# Patient Record
Sex: Male | Born: 1965 | Race: White | Hispanic: No | Marital: Married | Smoking: Never smoker
Health system: Southern US, Community
[De-identification: ages and names within clinical notes are randomized; demographics above are authoritative.]

## PROBLEM LIST (undated history)

## (undated) DIAGNOSIS — I447 Left bundle-branch block, unspecified: Secondary | ICD-10-CM

## (undated) DIAGNOSIS — J45909 Unspecified asthma, uncomplicated: Secondary | ICD-10-CM

## (undated) DIAGNOSIS — K219 Gastro-esophageal reflux disease without esophagitis: Secondary | ICD-10-CM

## (undated) DIAGNOSIS — N529 Male erectile dysfunction, unspecified: Secondary | ICD-10-CM

## (undated) DIAGNOSIS — Z973 Presence of spectacles and contact lenses: Secondary | ICD-10-CM

## (undated) DIAGNOSIS — I10 Essential (primary) hypertension: Secondary | ICD-10-CM

## (undated) DIAGNOSIS — I429 Cardiomyopathy, unspecified: Secondary | ICD-10-CM

## (undated) DIAGNOSIS — M109 Gout, unspecified: Secondary | ICD-10-CM

## (undated) DIAGNOSIS — I509 Heart failure, unspecified: Secondary | ICD-10-CM

## (undated) DIAGNOSIS — E785 Hyperlipidemia, unspecified: Secondary | ICD-10-CM

## (undated) DIAGNOSIS — G4700 Insomnia, unspecified: Secondary | ICD-10-CM

## (undated) HISTORY — DX: Essential (primary) hypertension: I10

## (undated) HISTORY — DX: Morbid (severe) obesity due to excess calories: E66.01

## (undated) HISTORY — DX: Unspecified asthma, uncomplicated: J45.909

## (undated) HISTORY — DX: Male erectile dysfunction, unspecified: N52.9

## (undated) HISTORY — DX: Hyperlipidemia, unspecified: E78.5

## (undated) HISTORY — PX: HERNIA REPAIR: SHX51

## (undated) HISTORY — DX: Insomnia, unspecified: G47.00

## (undated) HISTORY — DX: Cardiomyopathy, unspecified: I42.9

## (undated) HISTORY — DX: Gout, unspecified: M10.9

---

## 2001-06-29 ENCOUNTER — Encounter: Payer: Self-pay | Admitting: Family Medicine

## 2001-06-29 ENCOUNTER — Encounter: Admission: RE | Admit: 2001-06-29 | Discharge: 2001-06-29 | Payer: Self-pay | Admitting: Family Medicine

## 2002-10-26 ENCOUNTER — Encounter: Admission: RE | Admit: 2002-10-26 | Discharge: 2002-10-26 | Payer: Self-pay | Admitting: Family Medicine

## 2002-10-26 ENCOUNTER — Encounter: Payer: Self-pay | Admitting: Family Medicine

## 2003-10-07 ENCOUNTER — Encounter: Admission: RE | Admit: 2003-10-07 | Discharge: 2003-10-07 | Payer: Self-pay | Admitting: Family Medicine

## 2003-10-07 ENCOUNTER — Encounter: Payer: Self-pay | Admitting: Family Medicine

## 2007-12-04 ENCOUNTER — Ambulatory Visit (HOSPITAL_COMMUNITY): Admission: RE | Admit: 2007-12-04 | Discharge: 2007-12-04 | Payer: Self-pay | Admitting: Surgery

## 2011-04-27 NOTE — Op Note (Signed)
NAME:  Travis Cline, Travis Cline              ACCOUNT NO.:  [id number]   MEDICAL RECORD NO.:  [id number]          PATIENT TYPE:  AMB   LOCATION:  DAY                          FACILITY:  [facility]   PHYSICIAN:  Wilmon Arms. Corliss Skains, M.D. DATE OF BIRTH:  28-Sep-1966   DATE OF PROCEDURE:  12/04/2007  DATE OF DISCHARGE:                               OPERATIVE REPORT   PREOPERATIVE DIAGNOSIS:  Umbilical hernia.   POSTOPERATIVE DIAGNOSIS:  Umbilical hernia.   PROCEDURE PERFORMED:  Umbilical hernia repair with mesh.   SURGEON:  Wilmon Arms. Tsuei, M.D., FACS   ANESTHESIA:  General.   INDICATIONS:  The patient is a 45 year old male who presents with a 7-  month history of a slowly enlarging bulge in his umbilicus which  occasionally causes him discomfort.  He denies obstructive symptoms.  He  presents today for elective repair of his umbilical hernia.   DESCRIPTION OF PROCEDURE:  The patient was brought to the operating room  and placed in the supine position on the operating room table.  After an  adequate level general anesthesia was obtained, the patient's  periumbilical region was shaved, prepped with Betadine, and draped in  sterile fashion.  The area around his umbilicus was infiltrated with  0.50% Marcaine.  A transverse incision was made above his umbilicus.  Dissection was carried down to the subcutaneous tissues with cautery.  We were able to bluntly dissect around the hernia sac.  Careful  examination showed that the patient actually has 3 hernia sacs densely  adherent to the undersurface of the umbilicus.  These were all dissected  free.  The 3 defects were all immediately adjacent to each other.  We  connected all of these defects together to create one defect measuring  about a centimeter and a half across.  All of the hernia sacs were then  reduced into the preperitoneal space.  I undermined the preperitoneum in  all directions.  A small Ventralex mesh was then inserted into the  preperitoneal space.  It was secured with 4 transfascia 0 Prolene  sutures.  These were then tied down to suspend the mesh from the  posterior surface of the fascia.  The tape of the Ventralex mesh was  then amputated.  The fascia was closed with figure-of-eight 0 Prolene  sutures.  Three-0 Vicryl was used to  tack down the base of the umbilicus to the fascia.  Three-0 Vicryl was  then used to close the subcutaneous tissues.  Four-0 Monocryl was used  to close the skin.  Steri-Strips and clean dressings were applied.  The  patient was then extubated and brought to recovery room in stable  condition.  All sponge, instrument, and needle counts were correct.      Wilmon Arms. Tsuei, M.D.  Electronically Signed     MKT/MEDQ  D:  12/04/2007  T:  12/05/2007  Job:  [id number]   cc:   Donia Guiles, M.D.  Fax: [phone]

## 2011-09-17 LAB — CBC
HCT: 46.2
Hemoglobin: 16.2
MCHC: 35
MCV: 89
Platelets: 176
RBC: 5.19
RDW: 12.9
WBC: 6

## 2011-09-17 LAB — BASIC METABOLIC PANEL
BUN: 10
CO2: 23
Calcium: 9.5
Chloride: 108
Creatinine, Ser: 0.77
GFR calc Af Amer: >60
GFR calc non Af Amer: >60
Glucose, Bld: 165 — ABNORMAL HIGH
Potassium: 3.9
Sodium: 140

## 2011-09-17 LAB — DIFFERENTIAL
Basophils Absolute: 0.1
Basophils Relative: 2 — ABNORMAL HIGH
Eosinophils Absolute: 0.1 — ABNORMAL LOW
Eosinophils Relative: 2
Lymphocytes Relative: 28
Lymphs Abs: 1.6
Monocytes Absolute: 0.5
Monocytes Relative: 8
Neutro Abs: 3.7
Neutrophils Relative %: 62

## 2012-09-15 ENCOUNTER — Other Ambulatory Visit: Payer: Self-pay | Admitting: Family Medicine

## 2012-09-15 DIAGNOSIS — R7989 Other specified abnormal findings of blood chemistry: Secondary | ICD-10-CM

## 2012-09-22 ENCOUNTER — Other Ambulatory Visit: Payer: Self-pay

## 2012-09-29 ENCOUNTER — Ambulatory Visit
Admission: RE | Admit: 2012-09-29 | Discharge: 2012-09-29 | Disposition: A | Discharge status: Home / Self Care | Payer: 59 | Source: Ambulatory Visit | Attending: Family Medicine | Admitting: Family Medicine

## 2012-09-29 DIAGNOSIS — R7989 Other specified abnormal findings of blood chemistry: Secondary | ICD-10-CM

## 2015-04-16 ENCOUNTER — Other Ambulatory Visit (HOSPITAL_COMMUNITY): Payer: Self-pay | Admitting: Respiratory Therapy

## 2015-04-16 DIAGNOSIS — R0602 Shortness of breath: Secondary | ICD-10-CM

## 2015-04-22 ENCOUNTER — Encounter (HOSPITAL_COMMUNITY): Payer: Self-pay

## 2015-06-17 ENCOUNTER — Ambulatory Visit (INDEPENDENT_AMBULATORY_CARE_PROVIDER_SITE_OTHER): Payer: BC Managed Care – PPO | Admitting: Internal Medicine

## 2015-06-17 DIAGNOSIS — R0602 Shortness of breath: Secondary | ICD-10-CM

## 2015-06-17 LAB — PULMONARY FUNCTION TEST
DL/VA % pred: 115 %
DL/VA: 5.4 ml/min/mmHg/L
DLCO UNC % PRED: 99 %
DLCO UNC: 32.49 ml/min/mmHg
FEF 25-75 Post: 2.93 L/sec
FEF 25-75 Pre: 2.09 L/sec
FEF2575-%Change-Post: 40 %
FEF2575-%Pred-Post: 82 %
FEF2575-%Pred-Pre: 58 %
FEV1-%Change-Post: 8 %
FEV1-%PRED-POST: 78 %
FEV1-%Pred-Pre: 72 %
FEV1-POST: 3.14 L
FEV1-Pre: 2.88 L
FEV1FVC-%CHANGE-POST: 2 %
FEV1FVC-%Pred-Pre: 94 %
FEV6-%Change-Post: 5 %
FEV6-%PRED-PRE: 78 %
FEV6-%Pred-Post: 82 %
FEV6-POST: 4.09 L
FEV6-PRE: 3.88 L
FEV6FVC-%CHANGE-POST: 0 %
FEV6FVC-%PRED-POST: 102 %
FEV6FVC-%Pred-Pre: 102 %
FVC-%CHANGE-POST: 5 %
FVC-%PRED-POST: 80 %
FVC-%PRED-PRE: 75 %
FVC-PRE: 3.9 L
FVC-Post: 4.13 L
POST FEV6/FVC RATIO: 99 %
PRE FEV1/FVC RATIO: 74 %
PRE FEV6/FVC RATIO: 99 %
Post FEV1/FVC ratio: 76 %
RV % PRED: 97 %
RV: 1.97 L
TLC % PRED: 86 %
TLC: 6.07 L

## 2015-06-17 NOTE — Progress Notes (Signed)
PFT done today. 

## 2015-07-15 ENCOUNTER — Ambulatory Visit (INDEPENDENT_AMBULATORY_CARE_PROVIDER_SITE_OTHER): Payer: BC Managed Care – PPO | Admitting: Pulmonary Disease

## 2015-07-15 ENCOUNTER — Encounter (INDEPENDENT_AMBULATORY_CARE_PROVIDER_SITE_OTHER): Payer: Self-pay

## 2015-07-15 ENCOUNTER — Ambulatory Visit (INDEPENDENT_AMBULATORY_CARE_PROVIDER_SITE_OTHER)
Admission: RE | Admit: 2015-07-15 | Discharge: 2015-07-15 | Disposition: A | Discharge status: Home / Self Care | Payer: BC Managed Care – PPO | Source: Ambulatory Visit | Attending: Pulmonary Disease | Admitting: Pulmonary Disease

## 2015-07-15 VITALS — BP 122/84 | HR 98 | Temp 97.2°F | Ht 70.0 in | Wt 272.2 lb

## 2015-07-15 DIAGNOSIS — R0600 Dyspnea, unspecified: Secondary | ICD-10-CM | POA: Diagnosis not present

## 2015-07-15 DIAGNOSIS — J683 Other acute and subacute respiratory conditions due to chemicals, gases, fumes and vapors: Secondary | ICD-10-CM

## 2015-07-15 DIAGNOSIS — J4520 Mild intermittent asthma, uncomplicated: Secondary | ICD-10-CM

## 2015-07-15 MED ORDER — CLONAZEPAM 0.5 MG PO TABS: 0.5000 mg | ORAL_TABLET | Freq: Two times a day (BID) | ORAL | Status: DC

## 2015-07-15 MED ORDER — FLUTICASONE-SALMETEROL 250-50 MCG/DOSE IN AEPB: 1.0000 | INHALATION_SPRAY | Freq: Two times a day (BID) | RESPIRATORY_TRACT | Status: DC

## 2015-07-15 NOTE — Patient Instructions (Signed)
Travis Oka- it was great meeting you today...  Today we reviewed your history & the previous pulmonary function test,     and did a CXR & oxygen saturation test w/ exercise... We will also schedulae a 2DEchocardiogram to check your heart pump function in light of your HBP & BP meds...    We will contact you w/ the results when available...   In the meanwhile we will start treatment for the chest wall component w/ a combination relaxer as we discussed>    Start the [facility] 0.5mg  one tab twice daily...  For the reactive airways component- I rec starting [id number]- one inhalation twice daily...  Call for any questions...  Let's plan a follow up visit in 4-6weeks, sooner if needed for any breathing problems.Marland KitchenMarland Kitchen

## 2015-07-16 ENCOUNTER — Encounter: Payer: Self-pay | Admitting: Nurse Practitioner

## 2015-07-16 ENCOUNTER — Encounter: Payer: Self-pay | Admitting: Pulmonary Disease

## 2015-07-16 ENCOUNTER — Ambulatory Visit (HOSPITAL_COMMUNITY): Payer: BC Managed Care – PPO | Attending: Cardiology

## 2015-07-16 ENCOUNTER — Other Ambulatory Visit: Payer: Self-pay

## 2015-07-16 ENCOUNTER — Ambulatory Visit (INDEPENDENT_AMBULATORY_CARE_PROVIDER_SITE_OTHER): Payer: BC Managed Care – PPO | Admitting: Nurse Practitioner

## 2015-07-16 VITALS — BP 110/80 | HR 88 | Ht 70.0 in | Wt 272.0 lb

## 2015-07-16 DIAGNOSIS — R0600 Dyspnea, unspecified: Secondary | ICD-10-CM | POA: Diagnosis not present

## 2015-07-16 DIAGNOSIS — I429 Cardiomyopathy, unspecified: Secondary | ICD-10-CM

## 2015-07-16 DIAGNOSIS — I200 Unstable angina: Secondary | ICD-10-CM

## 2015-07-16 DIAGNOSIS — I10 Essential (primary) hypertension: Secondary | ICD-10-CM

## 2015-07-16 DIAGNOSIS — I071 Rheumatic tricuspid insufficiency: Secondary | ICD-10-CM | POA: Insufficient documentation

## 2015-07-16 DIAGNOSIS — Z01812 Encounter for preprocedural laboratory examination: Secondary | ICD-10-CM | POA: Diagnosis not present

## 2015-07-16 DIAGNOSIS — E785 Hyperlipidemia, unspecified: Secondary | ICD-10-CM | POA: Insufficient documentation

## 2015-07-16 DIAGNOSIS — I340 Nonrheumatic mitral (valve) insufficiency: Secondary | ICD-10-CM | POA: Diagnosis not present

## 2015-07-16 DIAGNOSIS — I517 Cardiomegaly: Secondary | ICD-10-CM | POA: Diagnosis not present

## 2015-07-16 DIAGNOSIS — I371 Nonrheumatic pulmonary valve insufficiency: Secondary | ICD-10-CM | POA: Insufficient documentation

## 2015-07-16 LAB — BASIC METABOLIC PANEL
BUN: 18 mg/dL (ref 6–23)
CO2: 23 meq/L (ref 19–32)
CREATININE: 0.95 mg/dL (ref 0.40–1.50)
Calcium: 9.4 mg/dL (ref 8.4–10.5)
Chloride: 108 mEq/L (ref 96–112)
GFR: 89.57 mL/min (ref >60.00)
GLUCOSE: 109 mg/dL — AB (ref 70–99)
Potassium: 4.5 mEq/L (ref 3.5–5.1)
Sodium: 139 mEq/L (ref 135–145)

## 2015-07-16 LAB — CBC WITH DIFFERENTIAL/PLATELET
Basophils Absolute: 0 10*3/uL (ref 0.0–0.1)
Basophils Relative: 0.2 % (ref 0.0–3.0)
Eosinophils Absolute: 0.1 10*3/uL (ref 0.0–0.7)
Eosinophils Relative: 1.3 % (ref 0.0–5.0)
HCT: 51.6 % (ref 39.0–52.0)
HEMOGLOBIN: 17.3 g/dL — AB (ref 13.0–17.0)
Lymphocytes Relative: 21.8 % (ref 12.0–46.0)
Lymphs Abs: 1.9 10*3/uL (ref 0.7–4.0)
MCHC: 33.4 g/dL (ref 30.0–36.0)
MCV: 89.9 fl (ref 78.0–100.0)
MONOS PCT: 7.7 % (ref 3.0–12.0)
Monocytes Absolute: 0.7 10*3/uL (ref 0.1–1.0)
Neutro Abs: 5.9 10*3/uL (ref 1.4–7.7)
Neutrophils Relative %: 69 % (ref 43.0–77.0)
Platelets: 192 10*3/uL (ref 150.0–400.0)
RBC: 5.74 Mil/uL (ref 4.22–5.81)
RDW: 13.8 % (ref 11.5–15.5)
WBC: 8.6 10*3/uL (ref 4.0–10.5)

## 2015-07-16 LAB — PROTIME-INR
INR: 1 ratio (ref 0.8–1.0)
PROTHROMBIN TIME: 11 s (ref 9.6–13.1)

## 2015-07-16 LAB — MAGNESIUM: MAGNESIUM: 1.9 mg/dL (ref 1.5–2.5)

## 2015-07-16 MED ORDER — CARVEDILOL 12.5 MG PO TABS: 12.5000 mg | ORAL_TABLET | Freq: Two times a day (BID) | ORAL | Status: DC

## 2015-07-16 MED ORDER — ASPIRIN EC 81 MG PO TBEC: 81.0000 mg | DELAYED_RELEASE_TABLET | Freq: Every day | ORAL | Status: DC

## 2015-07-16 MED ORDER — CARVEDILOL 25 MG PO TABS: 25.0000 mg | ORAL_TABLET | Freq: Two times a day (BID) | ORAL | Status: DC

## 2015-07-16 MED ORDER — FUROSEMIDE 40 MG PO TABS: 40.0000 mg | ORAL_TABLET | Freq: Every day | ORAL | Status: DC

## 2015-07-16 MED ORDER — NITROGLYCERIN 0.4 MG SL SUBL: 0.4000 mg | SUBLINGUAL_TABLET | SUBLINGUAL | Status: DC | PRN

## 2015-07-16 NOTE — Addendum Note (Signed)
Addended by: Nicolasa Ducking R on: 07/16/2015 04:25 PM   Modules accepted: Orders

## 2015-07-16 NOTE — Patient Instructions (Addendum)
Medication Instructions:  Your physician has recommended you make the following change in your medication:  1)STOP Atenolol 2)START Carvedilol 12.5mg  Twice Daily. An Rx has been sent to your pharmacy 3)START Aspirin  dily 4)START sublingual Nitro. Use as directed 3)RESUME Lasix  daily. An Rx has been sent to your pharmacy  Labwork: Today: Bmet, Cbc, Mag, Pt/Inr  Testing/Procedures: Your physician has requested that you have a cardiac catheterization. Cardiac catheterization is used to diagnose and/or treat various heart conditions. Doctors may recommend this procedure for a number of different reasons. The most common reason is to evaluate chest pain. Chest pain can be a symptom of coronary artery disease (CAD), and cardiac catheterization can show whether plaque is narrowing or blocking your heart's arteries. This procedure is also used to evaluate the valves, as well as measure the blood flow and oxygen levels in different parts of your heart. For further information please visit [email or web address] Please follow instruction sheet, as given.  The CPT code for this procedure is 93458.   Follow-Up: Your physician recommends that you schedule a follow-up appointment pending your procedure  Any Other Special Instructions Will Be Listed Below (If Applicable).   Nitroglycerin sublingual tablets What is this medicine? NITROGLYCERIN (nye troe GLI ser in) is a type of vasodilator. It relaxes blood vessels, increasing the blood and oxygen supply to your heart. This medicine is used to relieve chest pain caused by angina. It is also used to prevent chest pain before activities like climbing stairs, going outdoors in cold weather, or sexual activity. This medicine may be used for other purposes; ask your health care provider or pharmacist if you have questions. COMMON BRAND NAME(S): Nitroquick, Nitrostat, Nitrotab What should I tell my health care provider before I take this medicine? They  need to know if you have any of these conditions: -anemia -head injury, recent stroke, or bleeding in the brain -liver disease -previous heart attack -an unusual or allergic reaction to nitroglycerin, other medicines, foods, dyes, or preservatives -pregnant or trying to get pregnant -breast-feeding How should I use this medicine? Take this medicine by mouth as needed. At the first sign of an angina attack (chest pain or tightness) place one tablet under your tongue. You can also take this medicine 5 to 10 minutes before an event likely to produce chest pain. Follow the directions on the prescription label. Let the tablet dissolve under the tongue. Do not swallow whole. Replace the dose if you accidentally swallow it. It will help if your mouth is not dry. Saliva around the tablet will help it to dissolve more quickly. Do not eat or drink, smoke or chew tobacco while a tablet is dissolving. If you are not better within 5 minutes after taking ONE dose of nitroglycerin, call 9-1-1 immediately to seek emergency medical care. Do not take more than 3 nitroglycerin tablets over 15 minutes. If you take this medicine often to relieve symptoms of angina, your doctor or health care professional may provide you with different instructions to manage your symptoms. If symptoms do not go away after following these instructions, it is important to call 9-1-1 immediately. Do not take more than 3 nitroglycerin tablets over 15 minutes. Talk to your pediatrician regarding the use of this medicine in children. Special care may be needed. Overdosage: If you think you have taken too much of this medicine contact a poison control center or emergency room at once. NOTE: This medicine is only for you. Do not share this medicine with others.  What if I miss a dose? This does not apply. This medicine is only used as needed. What may interact with this medicine? Do not take this medicine with any of the following  medications: -certain migraine medicines like ergotamine and dihydroergotamine (DHE) -medicines used to treat erectile dysfunction like sildenafil, tadalafil, and vardenafil -riociguat This medicine may also interact with the following medications: -alteplase -aspirin -heparin -medicines for high blood pressure -medicines for mental depression -other medicines used to treat angina -phenothiazines like chlorpromazine, mesoridazine, prochlorperazine, thioridazine This list may not describe all possible interactions. Give your health care provider a list of all the medicines, herbs, non-prescription drugs, or dietary supplements you use. Also tell them if you smoke, drink alcohol, or use illegal drugs. Some items may interact with your medicine. What should I watch for while using this medicine? Tell your doctor or health care professional if you feel your medicine is no longer working. Keep this medicine with you at all times. Sit or lie down when you take your medicine to prevent falling if you feel dizzy or faint after using it. Try to remain calm. This will help you to feel better faster. If you feel dizzy, take several deep breaths and lie down with your feet propped up, or bend forward with your head resting between your knees. You may get drowsy or dizzy. Do not drive, use machinery, or do anything that needs mental alertness until you know how this drug affects you. Do not stand or sit up quickly, especially if you are an older patient. This reduces the risk of dizzy or fainting spells. Alcohol can make you more drowsy and dizzy. Avoid alcoholic drinks. Do not treat yourself for coughs, colds, or pain while you are taking this medicine without asking your doctor or health care professional for advice. Some ingredients may increase your blood pressure. What side effects may I notice from receiving this medicine? Side effects that you should report to your doctor or health care professional as  soon as possible: -blurred vision -dry mouth -skin rash -sweating -the feeling of extreme pressure in the head -unusually weak or tired Side effects that usually do not require medical attention (report to your doctor or health care professional if they continue or are bothersome): -flushing of the face or neck -headache -irregular heartbeat, palpitations -nausea, vomiting This list may not describe all possible side effects. Call your doctor for medical advice about side effects. You may report side effects to FDA at 1-800-FDA-1088. Where should I keep my medicine? Keep out of the reach of children. Store at room temperature between 20 and 25 degrees C (68 and 77 degrees F). Store in Retail buyer. Protect from light and moisture. Keep tightly closed. Throw away any unused medicine after the expiration date. NOTE: This sheet is a summary. It may not cover all possible information. If you have questions about this medicine, talk to your doctor, pharmacist, or health care provider.  2015, Elsevier/[employer]. (2013-09-27 17:57:36)

## 2015-07-16 NOTE — Progress Notes (Signed)
Subjective:     Patient ID: Travis Cline, male   DOB: 1966-09-03, 49 y.o.   MRN: [id number]  HPI 49 y/o WM, referred by Durwin Reges & Dr. Lupita Raider at [facility] for a dyspnea evaluation...       Armarion describes dyspnea- a sensation of being hard to breathe, out of breath, "like there is something in my chest- a tightness, funny feeling, not really a pain" that ?started w/ an illness in April while in [place] & continued for 3 weeks or so by his history after return;  He noted low grade fever, aching, cough, small amt yellow sput w/ blood streaks on one occas, and very SOB (seemed worse while sitting as opposed to walking);  He saw Tommye Standard and treated w/ an Albuterol inhaler- ?if any better w/ this;  He notes the SOB is more trying to get the air "IN", can't get a deep breath, not satisfied breathing & notes relief after a deep sigh breath or a coughing jag...       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      Current Meds>  Aten50, Amlod10, Lisin40, Lasix40 (hasn't taken due to work sched), Simva20, Uloric40, Colchicine0.6.Marland KitchenMarland Kitchen  EXAM reveals Afeb, VSS, O2sat=97% on RA;  Heent- neg, mallampati1;  Chest- clear w/o w/r/r;  Heart- RR, gr1-2/6 SEM,S3 gallop rhythm, no rub;  Abd- obese, soft, neg;  Ext- trace edema w/o c/c/etc...  CXR today 07/15/15 shows borderline heart size, mild vasc congestion, low lung vols and basilar atx...   PFT 06/17/15 showed FVC=3.90 (75%), FEV1=2.88 (72%), %1sec=74, mid-flows reduced at 58% predicted; post-bronchodil showed an 8% improvement in FEV1;  LungVols  showed TLC=6.07 (86%), RV=1.97 (97%), DLCO=99%;  These are c/w some mild obstruction/ small airways disease...  Oxygen saturation test> O2sat= 97% on RA w/ pulse=89/min; he ambulated 3 laps in the office w/ lowest O2sat=96% w/ pulse=101/min....   LABS> only labs in Epic from 2008 (ok x BS=165);  LABS from [facility] 06/2015 showed CBC- wnl, TSH=2.11, Urate=8.6 (put on Uloric);  LABS by Cards 07/16/15- BMet- wnl x BS=116;  CBC- wnl;  Mag=1.9; Protime- wnl...  2DEcho done 07/16/15 showed LV mod dilated w/ severe decreased LVF- EF=15% w/ diffuse HK and AK anteroseptal/ apical/ inferior myocardium, decreased diastolic compliance, mildMR, LA severe dil, RV mild dil, PAsys=10mmHg...   EKG w/ NSR, rate91, LBBB NAD...   IMP/PLAN>>  His dyspnea appears multifactorial w/ major component from LV dysfuction/ Sys+Diast CHF and EF=15%; plus PH w/ PAsys=61mmHg by 2DEcho; plus an element of chest wall musc spasm giving him the sensation of can't get the air "IN" etc;  Finally PFT shows some small airways dis and we discussed trial Advair250Bid, Klonopin 0.5Bid, and med changes per Cards + additional eval=>   ADDENDUM>>  Pt had 2DEcho 07/16/15 w/ results above- he was seen urgently by CARDS-PA & DrKlein, meds adjusted w/ Aten=> changed to COREG 12.5Bid, resume LASIX40/d, add ASA81/d, NTG prn... They plan cardiac cath ASAP and further considerations (AICD) pending this result...    Past Medical History  Diagnosis Date  . Essential hypertension >> on Aten50, Amlod10, Lisin40, Lasix40   . Hyperlipemia >> on Simva20   . Erectile dysfunction   . Gout >> on Uloric40 & Colchicine0.6   . Insomnia   . Morbid obesity >> 5'10" Tall, Wt=272#, BMI=39         Abd Sonar 09/2012 showed severe fatty liver dis, normal GB    Renal Stones >> bilat renal stones and left renal cyst     No past surgical history on file. UMBILICAL HERNIA REPAIR >> 11/2007 by DrTsuei   Outpatient Encounter Prescriptions as of 07/15/2015  Medication Sig   . albuterol (PROVENTIL HFA;VENTOLIN HFA) 108 (90 BASE) MCG/ACT inhaler Inhale 2 puffs into the lungs every 4 (four) hours as needed for wheezing or shortness of breath.  Marland Kitchen amLODipine (NORVASC) 10 MG tablet Take 10 mg by mouth daily.  . cimetidine (TAGAMET) 200 MG tablet Take 200 mg by mouth 2 (two) times daily as needed.  . colchicine 0.6 MG tablet Take 0.6 mg by mouth daily as needed.  Marland Kitchen lisinopril (PRINIVIL,ZESTRIL) 40 MG tablet Take 40 mg by mouth daily.  . prenatal vitamin w/FE, FA (NATACHEW) 29-1 MG CHEW chewable tablet Chew 1 tablet by mouth daily at 12 noon.  . simvastatin (ZOCOR) 20 MG tablet Take 20 mg by mouth daily.  Marland Kitchen ULORIC 40 MG tablet Take 40 mg by mouth daily.  Marland Kitchen atenolol (TENORMIN) 50 MG tablet Take 50 mg by mouth daily.  .  furosemide (LASIX) 40 MG tablet  (pt not taking recently due to new handiman job)    No Known Allergies   Family History  Problem Relation Age of Onset  . Other Father     alive & well @ 54.  . Other Mother     alive & well @ 66.  . Other      siblings alive and well.    History   Social History  . Marital Status: Married    Spouse Name: N/A  . Number of Children: N/A  . Years of Education: N/A   Occupational History  . Not on file.   Social History Main Topics  . Smoking status: Never Smoker   . Smokeless tobacco: Not on file  . Alcohol Use: 0.6 - 1.2 oz/week    1-2 Standard drinks or equivalent per week     Comment: was previously drinking 2-3 beers/day - currently drinking about 4 beers/month.  . Drug Use: No  . Sexual Activity: Not on file   Other Topics Concern  . Not on file   Social History Narrative    He lives in [place] with his wife and 2 stepchildren. He has 2 children of his own who live in  [place]. He does not routinely exercise. 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    Current Medications, Allergies, Past Medical History, Past  Surgical History, Family History, and Social History were reviewed in [employer] record.   Review of Systems            All symptoms NEG except where BOLDED >>  Constitutional:  F/C/S, fatigue, anorexia, unexpected weight change. HEENT:  HA, visual changes, hearing loss, earache, nasal symptoms, sore throat, mouth sores, hoarseness. Resp:  cough, sputum, hemoptysis; SOB, tightness, wheezing. Cardio:  CP, palpit, DOE, orthopnea, edema. GI:  N/V/D/C, blood in stool; reflux, abd pain, distention, gas. GU:  dysuria, freq, urgency,  hematuria, flank pain, voiding difficulty. MS:  joint pain, swelling, tenderness, decr ROM; neck pain, back pain, etc. Neuro:  HA, tremors, seizures, dizziness, syncope, weakness, numbness, gait abn. Skin:  suspicious lesions or skin rash. Heme:  adenopathy, bruising, bleeding. Psyche:  confusion, agitation, sleep disturbance, hallucinations, anxiety, depression suicidal.   Objective:   Physical Exam      Vital Signs:  Reviewed...  General:  WD, obese, 49 y/o WM in NAD; alert & oriented; pleasant & cooperative... HEENT:  [place]/AT; Conjunctiva- pink, Sclera- nonicteric, EOM-wnl, PERRLA, EACs-clear, TMs-wnl; NOSE-clear; THROAT-clear & wnl. Neck:  Supple w/ fair ROM; no JVD; normal carotid impulses w/o bruits; no thyromegaly or nodules palpated; no lymphadenopathy. Chest:  Clear to P & A; without wheezes, rales, or rhonchi heard. Heart:  Regular Rhythm; Gr1/6 SEM & S3 gallop rhythm heard, no rubs... Abdomen:  Obese, soft & nontender- no guarding or rebound; normal bowel sounds; no organomegaly or masses palpated. Ext:  Normal ROM; without deformities +arthritic changes; no varicose veins, +venous insuffic, tr edema;  Pulses intact w/o bruits. Neuro:  No focal neuro deficits; sensory testing normal; gait normal & balance OK. Derm:  No lesions noted; no rash etc. Lymph:  No cervical, supraclavicular, axillary, or inguinal adenopathy  palpated.   Assessment:      IMP >>  Dyspnea> multifactorial w/ LVD, EF=15%, acute on chr sys/ diast CHF, PH w/ PAsys est=84mmHg, RADS, chest wall musc spasm all playing a roll... Likely component of RADS> PFT w/ small airways dis, Spirmetry w/ 8% improvement after bronchodil => try ADVAIR250Bid... Chest wall musc spasm> inability to get the air "IN" etc => trh KLONOPIN0.5mg  bid... Obesity> weight is 272# near his max & BMI=39; needs diet, get wt down, later to join cardiac rehab... CAD, ischemic cardiomyopathy, combined sys&diast CHF> apprec [facility] prompt response & service on behalf of MrFroning... 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    PLAN >>  His dyspnea appears multifactorial w/ major component from LV dysfuction/ Sys+Diast CHF and EF=15%; plus PH w/ PAsys=21mmHg by 2DEcho; plus an element of chest wall musc spasm giving him the sensation of can't get the air "IN" etc;  Finally PFT shows some small airways dis and we discussed trial Advair250Bid, Klonopin 0.5Bid, and med changes per Cards + additional eval=>  ADDENDUM>>  Pt had 2DEcho 07/16/15 w/ results above- he was seen urgently by [facility] & DrKlein, meds adjusted w/ Aten=> changed to COREG 12.5Bid, resume LASIX40/d, add ASA81/d, NTG prn... They plan cardiac cath ASAP and further considerations (AICD) pending this result...     Plan:     Patient's Medications  New Prescriptions   ASPIRIN EC 81 MG TABLET    Take 1 tablet (81 mg total) by mouth daily.   CLONAZEPAM (KLONOPIN) 0.5 MG TABLET    Take 1 tablet (0.5 mg total) by mouth 2 (two) times daily.   FLUTICASONE-SALMETEROL (ADVAIR) 250-50 MCG/DOSE AEPB    Inhale 1 puff into the lungs every 12 (twelve) hours.   FUROSEMIDE (LASIX) 40 MG TABLET    Take 1 tablet (40 mg total) by mouth daily.   NITROGLYCERIN (NITROSTAT) 0.4 MG SL TABLET    Place 1 tablet (0.4 mg total) under the tongue every 5 (five) minutes as needed.  Previous Medications   ALBUTEROL (PROVENTIL  HFA;VENTOLIN HFA) 108 (90 BASE) MCG/ACT INHALER    Inhale 2 puffs into the lungs every 4 (four) hours as needed for wheezing or shortness of breath.   AMLODIPINE (  NORVASC) 10 MG TABLET    Take 10 mg by mouth daily.   CIMETIDINE (TAGAMET) 200 MG TABLET    Take 200 mg by mouth 2 (two) times daily as needed.   COLCHICINE 0.6 MG TABLET    Take 0.6 mg by mouth daily as needed.   LISINOPRIL (PRINIVIL,ZESTRIL) 40 MG TABLET    Take 40 mg by mouth daily.   PRENATAL VITAMIN W/FE, FA (NATACHEW) 29-1 MG CHEW CHEWABLE TABLET    Chew 1 tablet by mouth daily at 12 noon.   SIMVASTATIN (ZOCOR) 20 MG TABLET    Take 20 mg by mouth daily.   ULORIC 40 MG TABLET    Take 40 mg by mouth daily.  Modified Medications   Modified Medication Previous Medication   FUROSEMIDE (LASIX) 40mg  Tabs     Take one tab each AM   CARVEDILOL (COREG) 12.5 MG TABLET carvedilol (COREG) 25 MG tablet      Take 1 tablet (12.5 mg total) by mouth 2 (two) times daily.    Take 1 tablet (25 mg total) by mouth 2 (two) times daily.  Discontinued Medications   ATENOLOL (TENORMIN) 50 MG TABLET    Take 50 mg by mouth daily.

## 2015-07-16 NOTE — Progress Notes (Signed)
Patient Name: Travis Cline Date of Encounter: 07/16/2015  Primary Care Provider:  Lupita Raider, MD Primary Cardiologist:  New - seen today with Dr. Graciela Husbands  Chief Complaint   49 year old male without a prior cardiac history who presents for evaluation secondary to an 8-10 month history of exertional dyspnea and chest pressure.  Past Medical History   Past Medical History  Diagnosis Date  . Essential hypertension   . Hyperlipemia   . Erectile dysfunction   . Gout   . Insomnia   . Morbid obesity   . Cardiomyopathy     a.  06/2015  echo: EF 15%, diffuse hypokinesis, anteroseptal/apical akinesis. Mild mitral regurgitation. Severely dilated left atrium. Mildly reduced RV function. ulnar artery systolic pressure 54 mmHg.   No past surgical history on file.  Allergies  No Known Allergies  HPI   49 year old male without prior cardiac history. He does have a history of hypertension, hyperlipidemia, and obesity. He lives locally but had previously traveled frequently for work and over the past year, I spent a fair amount of time in [place]. 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 Over the past 8-10 months, he has been experiencing progressive exertional substernal chest pressure associated with dyspnea, typically lasting 5-10 minutes, resolving with rest. Initially, symptoms were fairly infrequent but over the past 3-4 months, symptoms have been occurring with less and less provocation. In April of this year, he was spending some time in [place] for work and developed severe myalgias associated weakness, dyspnea, and chest pressure. He says that all of these symptoms persisted for the better part of 3 weeks prior to slowly abating. During that period of time, he says he was more or less laid up in bed and believes he put on 15-20 pounds. He felt better for about 2 weeks and then had recurrence of similar symptoms for about a week. He  did not seek any medical attention and thought perhaps he had just had the flu. Since then, he has had progression of substernal chest pressure and dyspnea prompting him to see his primary care doctor. He has also been experiencing lower extremity edema and was subsequently placed on Lasix therapy.   Following initiation of Lasix, his weight did come down and he had some improvement in symptoms but he stopped taking the Lasix because some of his job sites did not have bathrooms. He was referred to pulmonology and had a chest x-ray yesterday that showed pulmonary vascular congestion. Pulmonary function testing was also performed and an echocardiogram was ordered. Echo was performed and severe LV dysfunction was noted by the echo tech and the patient was added onto my schedule this morning. He is not currently experiencing symptoms at rest. He denies PND, palpitations, orthopnea, dizziness, syncope , or early satiety.  Home Medications  Prior to Admission medications   Medication Sig Start Date End Date Taking? Authorizing Provider  amLODipine (NORVASC) 10 MG tablet Take 10 mg by mouth daily.   Yes Historical Provider, MD  cimetidine (TAGAMET) 200 MG tablet Take 200 mg by mouth 2 (two) times daily as needed.   Yes Historical Provider, MD  clonazePAM (KLONOPIN) 0.5 MG tablet Take 1 tablet (0.5 mg total) by mouth 2 (two) times daily. 07/15/15  Yes Michele Mcalpine, MD  colchicine 0.6 MG tablet Take 0.6 mg by mouth daily as needed.   Yes Historical Provider, MD  Fluticasone-Salmeterol (ADVAIR) 250-50 MCG/DOSE AEPB Inhale 1  puff into the lungs every 12 (twelve) hours. 07/15/15  Yes Michele Mcalpine, MD  lisinopril (PRINIVIL,ZESTRIL) 40 MG tablet Take 40 mg by mouth daily.   Yes Historical Provider, MD  prenatal vitamin w/FE, FA (NATACHEW) 29-1 MG CHEW chewable tablet Chew 1 tablet by mouth daily at 12 noon.   Yes Historical Provider, MD  simvastatin (ZOCOR) 20 MG tablet Take 20 mg by mouth daily.   Yes Historical  Provider, MD  ULORIC 40 MG tablet Take 40 mg by mouth daily. 07/02/15  Yes Historical Provider, MD  albuterol (PROVENTIL HFA;VENTOLIN HFA) 108 (90 BASE) MCG/ACT inhaler Inhale 2 puffs into the lungs every 4 (four) hours as needed for wheezing or shortness of breath.    Historical Provider, MD  aspirin EC 81 MG tablet Take 1 tablet (81 mg total) by mouth daily. 07/16/15   Ok Anis, NP  carvedilol (COREG) 12.5 MG tablet Take 1 tablet (12.5 mg total) by mouth 2 (two) times daily. 07/16/15   Ok Anis, NP  furosemide (LASIX) 40 MG tablet Take 1 tablet (40 mg total) by mouth daily. 07/16/15   Ok Anis, NP  nitroGLYCERIN (NITROSTAT) 0.4 MG SL tablet Place 1 tablet (0.4 mg total) under the tongue every 5 (five) minutes as needed. 07/16/15   Ok Anis, NP    Family History  Family History  Problem Relation Age of Onset  . Other Father     alive & well @ 51.  . Other Mother     alive & well @ 41.  . Other      siblings alive and well.    Social History  History   Social History  . Marital Status: Married    Spouse Name: N/A  . Number of Children: N/A  . Years of Education: N/A   Occupational History  . Not on file.   Social History Main Topics  . Smoking status: Never Smoker   . Smokeless tobacco: Not on file  . Alcohol Use: 0.6 - 1.2 oz/week    1-2 Standard drinks or equivalent per week     Comment: was previously drinking 2-3 beers/day - currently drinking about 4 beers/month.  . Drug Use: No  . Sexual Activity: Not on file   Other Topics Concern  . Not on file   Social History Narrative    He lives in [place] with his wife and 2 stepchildren. He has 2 children of his own who live in  [place]. He does not routinely exercise. 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     Review of Systems  General:  No chills, fever, night sweats or weight changes.  Cardiovascular:    As outlined above, he has been expressing exertional substernal chest pressure associated with dyspnea. He has also had lower extremity edema. Dermatological: No rash, lesions/masses Respiratory: No cough,  Positive dyspnea Urologic: No hematuria, dysuria Abdominal:   No nausea, vomiting, diarrhea, bright red blood per rectum, melena, or hematemesis Neurologic:  No visual changes, wkns, changes in mental status. All other systems reviewed and are otherwise negative except as noted above.  Physical Exam  VS:  BP 110/80 mmHg  Pulse 88  Ht  (1.778 m)  Wt 272 lb (123.378 kg)  BMI 39.03 kg/m2 , BMI Body mass index is 39.03 kg/(m^2). GEN: Well nourished, well developed, in no acute distress. HEENT: normal. Neck: Supple,  no carotid  bruits, or masses. Neck is obese and it is difficult to gauge  Jugular venous pressure. Cardiac: RRR,  Positive S3. No murmurs. No clubbing, cyanosis, 1+ bilateral lower extremity edema to mid calf.  Radials/DP/PT 2+ and equal bilaterally.  Respiratory:  Respirations regular and unlabored, clear to auscultation bilaterally. GI: Soft, nontender, nondistended, BS + x 4. MS: no deformity or atrophy. Skin: warm and dry, no rash. Neuro:  Strength and sensation are intact. Psych: Normal affect.  Accessory Clinical Findings  ECG (performed today 2/2 new cardiomyopathy and unstable angina) -  Regular sinus rhythm, 91 , left bundle branch block -no old ECGs available for comparison.  Assessment & Plan  1.   Cardiomyopathy: patient presents with an 8-10 month history of progressive exertional dyspnea and substernal chest pressure and has now been found to have an EF of 15% by echocardiogram. ECG is notable for left bundle branch block. He did have a 2 to three-week episode of substernal chest pressure, dyspnea, malaise, and myalgias while in [place] for work in April and given ongoing symptoms and findings, I suspect that perhaps he had a large anterior MI at that  time. We discussed the pathophysiology of cardiomyopathy as well as additional workup, and medical management. I will add aspirin and will switch him from atenolol to carvedilol 12.5 mg twice a day. Continue ACE inhibitor therapy. Resume Lasix 40 mg daily.   He will need a definitive evaluation of his coronary anatomy and we discussed the indications forcardiac catheterization.  The patient understands that risks include but are not limited to stroke (1 in 1000), death (1 in 1000), kidney failure [usually temporary] (1 in 500), bleeding (1 in 200), allergic reaction [possibly serious] (1 in 200), and agrees to proceed.  I will check CBC, basic metabolic panel, and coags today. He has been scheduled for diagnostic catheterization on Monday August 8.  Pending results +/- revascularization, at some point, he will require re-eval of EF and consideration for BiV ICD therapy (LBBB).  2.  Unstable Angina:  As above, pt has been experiencing sscp w/ dyspnea over the past 8-10 months.  Ss, in combination with LBBB on ecg and newly discovered LV dysfxn are highly concerning for the presence of CAD.  As above, add asa, switch bb to coreg, cont acei and statin, add prn nitrates.  Cath scheduled for next Monday.  3.  Essential HTN:  Stable on bb, acei, ccb.  4.  HL:  On simvastatin @ home.  Lipids followed by PCP.  5.  Morbid Obesty:  Await w/u as above.  Would likely benefit from cardiac rehab.  6.  Disposition:  Await cath.  F/U in 2 wks.  Nicolasa Ducking, NP 07/16/2015, 1:56 PM  Pt with CHF and LBBB with exertional chest pain and severe LV dysfunction occurring with prolonged ( 2wk) episode of worsened symtpoms in April.  Cath is indicated as is aggressive medical therapy Change atenolol>>carvedilol anticpate entresto followed by aldactone If LV dysfunction persists will need ICD-CRT Reviewed with pt who is agreeable

## 2015-07-19 NOTE — H&P (View-Only) (Signed)
Patient Name: Thristan Downie Hilliard Clark Date of Encounter: 07/16/2015  Primary Care Provider:  Lupita Raider, MD Primary Cardiologist:  New - seen today with Dr. Graciela Husbands  Chief Complaint   49 year old male without a prior cardiac history who presents for evaluation secondary to an 8-10 month history of exertional dyspnea and chest pressure.  Past Medical History   Past Medical History  Diagnosis Date  . Essential hypertension   . Hyperlipemia   . Erectile dysfunction   . Gout   . Insomnia   . Morbid obesity   . Cardiomyopathy     a.  06/2015  echo: EF 15%, diffuse hypokinesis, anteroseptal/apical akinesis. Mild mitral regurgitation. Severely dilated left atrium. Mildly reduced RV function. ulnar artery systolic pressure 54 mmHg.   No past surgical history on file.  Allergies  No Known Allergies  HPI   49 year old male without prior cardiac history. He does have a history of hypertension, hyperlipidemia, and obesity. He lives locally but had previously traveled frequently for work and over the past year, I spent a fair amount of time in [place]. 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 Over the past 8-10 months, he has been experiencing progressive exertional substernal chest pressure associated with dyspnea, typically lasting 5-10 minutes, resolving with rest. Initially, symptoms were fairly infrequent but over the past 3-4 months, symptoms have been occurring with less and less provocation. In April of this year, he was spending some time in [place] for work and developed severe myalgias associated weakness, dyspnea, and chest pressure. He says that all of these symptoms persisted for the better part of 3 weeks prior to slowly abating. During that period of time, he says he was more or less laid up in bed and believes he put on 15-20 pounds. He felt better for about 2 weeks and then had recurrence of similar symptoms for about a week. He  did not seek any medical attention and thought perhaps he had just had the flu. Since then, he has had progression of substernal chest pressure and dyspnea prompting him to see his primary care doctor. He has also been experiencing lower extremity edema and was subsequently placed on Lasix therapy.   Following initiation of Lasix, his weight did come down and he had some improvement in symptoms but he stopped taking the Lasix because some of his job sites did not have bathrooms. He was referred to pulmonology and had a chest x-ray yesterday that showed pulmonary vascular congestion. Pulmonary function testing was also performed and an echocardiogram was ordered. Echo was performed and severe LV dysfunction was noted by the echo tech and the patient was added onto my schedule this morning. He is not currently experiencing symptoms at rest. He denies PND, palpitations, orthopnea, dizziness, syncope , or early satiety.  Home Medications  Prior to Admission medications   Medication Sig Start Date End Date Taking? Authorizing Provider  amLODipine (NORVASC) 10 MG tablet Take 10 mg by mouth daily.   Yes Historical Provider, MD  cimetidine (TAGAMET) 200 MG tablet Take 200 mg by mouth 2 (two) times daily as needed.   Yes Historical Provider, MD  clonazePAM (KLONOPIN) 0.5 MG tablet Take 1 tablet (0.5 mg total) by mouth 2 (two) times daily. 07/15/15  Yes Michele Mcalpine, MD  colchicine 0.6 MG tablet Take 0.6 mg by mouth daily as needed.   Yes Historical Provider, MD  Fluticasone-Salmeterol (ADVAIR) 250-50 MCG/DOSE AEPB Inhale 1  puff into the lungs every 12 (twelve) hours. 07/15/15  Yes Michele Mcalpine, MD  lisinopril (PRINIVIL,ZESTRIL) 40 MG tablet Take 40 mg by mouth daily.   Yes Historical Provider, MD  prenatal vitamin w/FE, FA (NATACHEW) 29-1 MG CHEW chewable tablet Chew 1 tablet by mouth daily at 12 noon.   Yes Historical Provider, MD  simvastatin (ZOCOR) 20 MG tablet Take 20 mg by mouth daily.   Yes Historical  Provider, MD  ULORIC 40 MG tablet Take 40 mg by mouth daily. 07/02/15  Yes Historical Provider, MD  albuterol (PROVENTIL HFA;VENTOLIN HFA) 108 (90 BASE) MCG/ACT inhaler Inhale 2 puffs into the lungs every 4 (four) hours as needed for wheezing or shortness of breath.    Historical Provider, MD  aspirin EC 81 MG tablet Take 1 tablet (81 mg total) by mouth daily. 07/16/15   Ok Anis, NP  carvedilol (COREG) 12.5 MG tablet Take 1 tablet (12.5 mg total) by mouth 2 (two) times daily. 07/16/15   Ok Anis, NP  furosemide (LASIX) 40 MG tablet Take 1 tablet (40 mg total) by mouth daily. 07/16/15   Ok Anis, NP  nitroGLYCERIN (NITROSTAT) 0.4 MG SL tablet Place 1 tablet (0.4 mg total) under the tongue every 5 (five) minutes as needed. 07/16/15   Ok Anis, NP    Family History  Family History  Problem Relation Age of Onset  . Other Father     alive & well @ 51.  . Other Mother     alive & well @ 41.  . Other      siblings alive and well.    Social History  History   Social History  . Marital Status: Married    Spouse Name: N/A  . Number of Children: N/A  . Years of Education: N/A   Occupational History  . Not on file.   Social History Main Topics  . Smoking status: Never Smoker   . Smokeless tobacco: Not on file  . Alcohol Use: 0.6 - 1.2 oz/week    1-2 Standard drinks or equivalent per week     Comment: was previously drinking 2-3 beers/day - currently drinking about 4 beers/month.  . Drug Use: No  . Sexual Activity: Not on file   Other Topics Concern  . Not on file   Social History Narrative    He lives in [place] with his wife and 2 stepchildren. He has 2 children of his own who live in  [place]. He does not routinely exercise. 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     Review of Systems  General:  No chills, fever, night sweats or weight changes.  Cardiovascular:    As outlined above, he has been expressing exertional substernal chest pressure associated with dyspnea. He has also had lower extremity edema. Dermatological: No rash, lesions/masses Respiratory: No cough,  Positive dyspnea Urologic: No hematuria, dysuria Abdominal:   No nausea, vomiting, diarrhea, bright red blood per rectum, melena, or hematemesis Neurologic:  No visual changes, wkns, changes in mental status. All other systems reviewed and are otherwise negative except as noted above.  Physical Exam  VS:  BP 110/80 mmHg  Pulse 88  Ht  (1.778 m)  Wt 272 lb (123.378 kg)  BMI 39.03 kg/m2 , BMI Body mass index is 39.03 kg/(m^2). GEN: Well nourished, well developed, in no acute distress. HEENT: normal. Neck: Supple,  no carotid  bruits, or masses. Neck is obese and it is difficult to gauge  Jugular venous pressure. Cardiac: RRR,  Positive S3. No murmurs. No clubbing, cyanosis, 1+ bilateral lower extremity edema to mid calf.  Radials/DP/PT 2+ and equal bilaterally.  Respiratory:  Respirations regular and unlabored, clear to auscultation bilaterally. GI: Soft, nontender, nondistended, BS + x 4. MS: no deformity or atrophy. Skin: warm and dry, no rash. Neuro:  Strength and sensation are intact. Psych: Normal affect.  Accessory Clinical Findings  ECG (performed today 2/2 new cardiomyopathy and unstable angina) -  Regular sinus rhythm, 91 , left bundle branch block -no old ECGs available for comparison.  Assessment & Plan  1.   Cardiomyopathy: patient presents with an 8-10 month history of progressive exertional dyspnea and substernal chest pressure and has now been found to have an EF of 15% by echocardiogram. ECG is notable for left bundle branch block. He did have a 2 to three-week episode of substernal chest pressure, dyspnea, malaise, and myalgias while in [place] for work in April and given ongoing symptoms and findings, I suspect that perhaps he had a large anterior MI at that  time. We discussed the pathophysiology of cardiomyopathy as well as additional workup, and medical management. I will add aspirin and will switch him from atenolol to carvedilol 12.5 mg twice a day. Continue ACE inhibitor therapy. Resume Lasix 40 mg daily.   He will need a definitive evaluation of his coronary anatomy and we discussed the indications forcardiac catheterization.  The patient understands that risks include but are not limited to stroke (1 in 1000), death (1 in 1000), kidney failure [usually temporary] (1 in 500), bleeding (1 in 200), allergic reaction [possibly serious] (1 in 200), and agrees to proceed.  I will check CBC, basic metabolic panel, and coags today. He has been scheduled for diagnostic catheterization on Monday August 8.  Pending results +/- revascularization, at some point, he will require re-eval of EF and consideration for BiV ICD therapy (LBBB).  2.  Unstable Angina:  As above, pt has been experiencing sscp w/ dyspnea over the past 8-10 months.  Ss, in combination with LBBB on ecg and newly discovered LV dysfxn are highly concerning for the presence of CAD.  As above, add asa, switch bb to coreg, cont acei and statin, add prn nitrates.  Cath scheduled for next Monday.  3.  Essential HTN:  Stable on bb, acei, ccb.  4.  HL:  On simvastatin @ home.  Lipids followed by PCP.  5.  Morbid Obesty:  Await w/u as above.  Would likely benefit from cardiac rehab.  6.  Disposition:  Await cath.  F/U in 2 wks.  Nicolasa Ducking, NP 07/16/2015, 1:56 PM  Pt with CHF and LBBB with exertional chest pain and severe LV dysfunction occurring with prolonged ( 2wk) episode of worsened symtpoms in April.  Cath is indicated as is aggressive medical therapy Change atenolol>>carvedilol anticpate entresto followed by aldactone If LV dysfunction persists will need ICD-CRT Reviewed with pt who is agreeable

## 2015-07-19 NOTE — Interval H&P Note (Signed)
Cath Lab Visit (complete for each Cath Lab visit)  Clinical Evaluation Leading to the Procedure:   ACS: No.  Non-ACS:    Anginal Classification: CCS IV  Anti-ischemic medical therapy: Minimal Therapy (1 class of medications)  Non-Invasive Test Results: High-risk stress test findings: cardiac mortality >3%/year  Prior CABG: No previous CABG      History and Physical Interval Note:  07/19/2015 4:49 PM  Mcarthur Rossetti Kronk  has presented today for surgery, with the diagnosis of unstable angina  The various methods of treatment have been discussed with the patient and family. After consideration of risks, benefits and other options for treatment, the patient has consented to  Procedure(s): Left Heart Cath and Coronary Angiography (N/A) as a surgical intervention .  The patient's history has been reviewed, patient examined, no change in status, stable for surgery.  I have reviewed the patient's chart and labs.  Questions were answered to the patient's satisfaction.     Lesleigh Noe

## 2015-07-21 ENCOUNTER — Ambulatory Visit (HOSPITAL_COMMUNITY)
Admission: RE | Admit: 2015-07-21 | Discharge: 2015-07-21 | Disposition: A | Discharge status: Home / Self Care | Payer: BC Managed Care – PPO | Source: Ambulatory Visit | Attending: Interventional Cardiology | Admitting: Interventional Cardiology

## 2015-07-21 ENCOUNTER — Encounter (HOSPITAL_COMMUNITY)
Admission: RE | Disposition: A | Discharge status: Home / Self Care | Payer: BC Managed Care – PPO | Source: Ambulatory Visit | Attending: Interventional Cardiology

## 2015-07-21 ENCOUNTER — Encounter (HOSPITAL_COMMUNITY): Payer: Self-pay | Admitting: Interventional Cardiology

## 2015-07-21 DIAGNOSIS — I5022 Chronic systolic (congestive) heart failure: Secondary | ICD-10-CM | POA: Insufficient documentation

## 2015-07-21 DIAGNOSIS — I420 Dilated cardiomyopathy: Secondary | ICD-10-CM | POA: Insufficient documentation

## 2015-07-21 DIAGNOSIS — E785 Hyperlipidemia, unspecified: Secondary | ICD-10-CM | POA: Insufficient documentation

## 2015-07-21 DIAGNOSIS — Z7982 Long term (current) use of aspirin: Secondary | ICD-10-CM | POA: Diagnosis not present

## 2015-07-21 DIAGNOSIS — I447 Left bundle-branch block, unspecified: Secondary | ICD-10-CM | POA: Insufficient documentation

## 2015-07-21 DIAGNOSIS — I2510 Atherosclerotic heart disease of native coronary artery without angina pectoris: Secondary | ICD-10-CM

## 2015-07-21 DIAGNOSIS — I10 Essential (primary) hypertension: Secondary | ICD-10-CM | POA: Diagnosis not present

## 2015-07-21 DIAGNOSIS — I25110 Atherosclerotic heart disease of native coronary artery with unstable angina pectoris: Secondary | ICD-10-CM | POA: Diagnosis not present

## 2015-07-21 DIAGNOSIS — I200 Unstable angina: Secondary | ICD-10-CM | POA: Insufficient documentation

## 2015-07-21 HISTORY — PX: CARDIAC CATHETERIZATION: SHX172

## 2015-07-21 SURGERY — LEFT HEART CATH AND CORONARY ANGIOGRAPHY

## 2015-07-21 MED ORDER — ASPIRIN 81 MG PO CHEW
81.0000 mg | CHEWABLE_TABLET | ORAL | Status: AC
  Administered 2015-07-21: 81 mg via ORAL

## 2015-07-21 MED ORDER — HEPARIN SODIUM (PORCINE) 1000 UNIT/ML IJ SOLN
INTRAMUSCULAR | Status: DC | PRN
  Administered 2015-07-21: 6000 [IU] via INTRAVENOUS

## 2015-07-21 MED ORDER — FENTANYL CITRATE (PF) 100 MCG/2ML IJ SOLN
INTRAMUSCULAR | Status: DC | PRN
  Administered 2015-07-21: 50 ug via INTRAVENOUS

## 2015-07-21 MED ORDER — MIDAZOLAM HCL 2 MG/2ML IJ SOLN
INTRAMUSCULAR | Status: DC | PRN
  Administered 2015-07-21: 1 mg via INTRAVENOUS

## 2015-07-21 MED ORDER — IOHEXOL 350 MG/ML SOLN
INTRAVENOUS | Status: DC | PRN
  Administered 2015-07-21: 80 mL via INTRACARDIAC

## 2015-07-21 MED ORDER — SODIUM CHLORIDE 0.9 % IV SOLN: 250.0000 mL | INTRAVENOUS | Status: DC | PRN

## 2015-07-21 MED ORDER — MIDAZOLAM HCL 2 MG/2ML IJ SOLN
INTRAMUSCULAR | Status: AC
Start: 2015-07-21 — End: 2015-07-21
  Filled 2015-07-21: qty 4

## 2015-07-21 MED ORDER — ASPIRIN 81 MG PO CHEW
CHEWABLE_TABLET | ORAL | Status: AC
  Filled 2015-07-21: qty 1

## 2015-07-21 MED ORDER — SODIUM CHLORIDE 0.9 % IJ SOLN: 3.0000 mL | INTRAMUSCULAR | Status: DC | PRN

## 2015-07-21 MED ORDER — NITROGLYCERIN 1 MG/10 ML FOR IR/CATH LAB
INTRA_ARTERIAL | Status: AC
  Filled 2015-07-21: qty 10

## 2015-07-21 MED ORDER — FENTANYL CITRATE (PF) 100 MCG/2ML IJ SOLN
INTRAMUSCULAR | Status: AC
  Filled 2015-07-21: qty 4

## 2015-07-21 MED ORDER — HEPARIN (PORCINE) IN NACL 2-0.9 UNIT/ML-% IJ SOLN
INTRAMUSCULAR | Status: AC
  Filled 2015-07-21: qty 1000

## 2015-07-21 MED ORDER — LIDOCAINE HCL (PF) 1 % IJ SOLN
INTRAMUSCULAR | Status: AC
  Filled 2015-07-21: qty 30

## 2015-07-21 MED ORDER — SODIUM CHLORIDE 0.9 % WEIGHT BASED INFUSION: 1.0000 mL/kg/h | INTRAVENOUS | Status: DC

## 2015-07-21 MED ORDER — SODIUM CHLORIDE 0.9 % IJ SOLN: 3.0000 mL | Freq: Two times a day (BID) | INTRAMUSCULAR | Status: DC

## 2015-07-21 MED ORDER — ASPIRIN 81 MG PO CHEW: 81.0000 mg | CHEWABLE_TABLET | Freq: Every day | ORAL | Status: DC

## 2015-07-21 MED ORDER — OXYCODONE-ACETAMINOPHEN 5-325 MG PO TABS: 1.0000 | ORAL_TABLET | ORAL | Status: DC | PRN

## 2015-07-21 MED ORDER — HEPARIN (PORCINE) IN NACL 2-0.9 UNIT/ML-% IJ SOLN
INTRAMUSCULAR | Status: AC
  Filled 2015-07-21: qty 500

## 2015-07-21 MED ORDER — VERAPAMIL HCL 2.5 MG/ML IV SOLN
INTRAVENOUS | Status: AC
  Filled 2015-07-21: qty 2

## 2015-07-21 MED ORDER — SODIUM CHLORIDE 0.9 % WEIGHT BASED INFUSION
3.0000 mL/kg/h | INTRAVENOUS | Status: DC
  Administered 2015-07-21: 3 mL/kg/h via INTRAVENOUS

## 2015-07-21 MED ORDER — SODIUM CHLORIDE 0.9 % IV SOLN
INTRAVENOUS | Status: DC | PRN
  Administered 2015-07-21 (×2): 10 mL/h via INTRAVENOUS

## 2015-07-21 MED ORDER — HEPARIN (PORCINE) IN NACL 2-0.9 UNIT/ML-% IJ SOLN
INTRAMUSCULAR | Status: DC | PRN
  Administered 2015-07-21: 08:00:00

## 2015-07-21 MED ORDER — ACETAMINOPHEN 325 MG PO TABS: 650.0000 mg | ORAL_TABLET | ORAL | Status: DC | PRN

## 2015-07-21 MED ORDER — VERAPAMIL HCL 2.5 MG/ML IV SOLN
INTRAVENOUS | Status: DC | PRN
  Administered 2015-07-21: 08:00:00 via INTRA_ARTERIAL

## 2015-07-21 MED ORDER — ONDANSETRON HCL 4 MG/2ML IJ SOLN: 4.0000 mg | Freq: Four times a day (QID) | INTRAMUSCULAR | Status: DC | PRN

## 2015-07-21 MED ORDER — HEPARIN SODIUM (PORCINE) 1000 UNIT/ML IJ SOLN
INTRAMUSCULAR | Status: AC
  Filled 2015-07-21: qty 1

## 2015-07-21 MED ORDER — LIDOCAINE HCL (PF) 1 % IJ SOLN
INTRAMUSCULAR | Status: DC | PRN
  Administered 2015-07-21: 5 mL via SUBCUTANEOUS

## 2015-07-21 SURGICAL SUPPLY — 9 items
CATH INFINITI 5 FR JL3.5 (CATHETERS) ×2 IMPLANT
CATH INFINITI JR4 5F (CATHETERS) ×2 IMPLANT
DEVICE RAD COMP TR BAND LRG (VASCULAR PRODUCTS) ×2 IMPLANT
GLIDESHEATH SLEND A-KIT 6F 22G (SHEATH) ×2 IMPLANT
KIT HEART LEFT (KITS) ×2 IMPLANT
PACK CARDIAC CATHETERIZATION (CUSTOM PROCEDURE TRAY) ×2 IMPLANT
TRANSDUCER W/STOPCOCK (MISCELLANEOUS) ×2 IMPLANT
TUBING CIL FLEX 10 FLL-RA (TUBING) ×2 IMPLANT
WIRE SAFE-T 1.5MM-J .035X260CM (WIRE) ×2 IMPLANT

## 2015-07-21 NOTE — Progress Notes (Signed)
TR BAND REMOVAL  LOCATION:    right radial  DEFLATED PER PROTOCOL:    Yes.    TIME BAND OFF / DRESSING APPLIED:    1045   SITE UPON ARRIVAL:    Level 0  SITE AFTER BAND REMOVAL:    Level 0  CIRCULATION SENSATION AND MOVEMENT:    Within Normal Limits   Yes.    COMMENTS:   TRB REMOVED / TEGADERM DSG APPLIED 

## 2015-07-21 NOTE — Discharge Instructions (Signed)
Radial Site Care °Refer to this sheet in the next few weeks. These instructions provide you with information on caring for yourself after your procedure. Your caregiver may also give you more specific instructions. Your treatment has been planned according to current medical practices, but problems sometimes occur. Call your caregiver if you have any problems or questions after your procedure. °HOME CARE INSTRUCTIONS °· You may shower the day after the procedure. Remove the bandage (dressing) and gently wash the site with plain soap and water. Gently pat the site dry. °· Do not apply powder or lotion to the site. °· Do not submerge the affected site in water for 3 to 5 days. °· Inspect the site at least twice daily. °· Do not flex or bend the affected arm for 24 hours. °· No lifting over 5 pounds (2.3 kg) for 5 days after your procedure. °· Do not drive home if you are discharged the same day of the procedure. Have someone else drive you. °· You may drive 24 hours after the procedure unless otherwise instructed by your caregiver. °· Do not operate machinery or power tools for 24 hours. °· A responsible adult should be with you for the first 24 hours after you arrive home. °What to expect: °· Any bruising will usually fade within 1 to 2 weeks. °· Blood that collects in the tissue (hematoma) may be painful to the touch. It should usually decrease in size and tenderness within 1 to 2 weeks. °SEEK IMMEDIATE MEDICAL CARE IF: °· You have unusual pain at the radial site. °· You have redness, warmth, swelling, or pain at the radial site. °· You have drainage (other than a small amount of blood on the dressing). °· You have chills. °· You have a fever or persistent symptoms for more than 72 hours. °· You have a fever and your symptoms suddenly get worse. °· Your arm becomes pale, cool, tingly, or numb. °· You have heavy bleeding from the site. Hold pressure on the site. °Document Released: 01/01/2011 Document Revised:  02/21/2012 Document Reviewed: 01/01/2011 °[employer]® Patient Information ©2015 [employer]. This information is not intended to replace advice given to you by your health care provider. Make sure you discuss any questions you have with your health care provider. ° °

## 2015-07-22 MED FILL — Nitroglycerin IV Soln 100 MCG/ML in D5W: INTRA_ARTERIAL | Qty: 10 | Status: AC

## 2015-08-12 ENCOUNTER — Encounter: Payer: Self-pay | Admitting: Pulmonary Disease

## 2015-08-12 ENCOUNTER — Ambulatory Visit (INDEPENDENT_AMBULATORY_CARE_PROVIDER_SITE_OTHER): Payer: BC Managed Care – PPO | Admitting: Pulmonary Disease

## 2015-08-12 DIAGNOSIS — J683 Other acute and subacute respiratory conditions due to chemicals, gases, fumes and vapors: Secondary | ICD-10-CM

## 2015-08-12 DIAGNOSIS — R0600 Dyspnea, unspecified: Secondary | ICD-10-CM

## 2015-08-12 DIAGNOSIS — I5022 Chronic systolic (congestive) heart failure: Secondary | ICD-10-CM | POA: Diagnosis not present

## 2015-08-12 DIAGNOSIS — I429 Cardiomyopathy, unspecified: Secondary | ICD-10-CM

## 2015-08-12 DIAGNOSIS — J4520 Mild intermittent asthma, uncomplicated: Secondary | ICD-10-CM

## 2015-08-12 NOTE — Progress Notes (Addendum)
Subjective:     Patient ID: Travis Cline, male   DOB: [birth year], 49 y.o.   MRN: [id number]  HPI ~  July 15, 2015:  Initial pulmonary consult w/ SN>   86 y/o WM, referred by Durwin Reges & Dr. Lupita Raider at [facility] for a dyspnea evaluation...       Travis Cline describes dyspnea- a sensation of being hard to breathe, out of breath, "like there is something in my chest- a tightness, funny feeling, not really a pain" that ?started w/ an illness in April while in [place] & continued for 3 weeks or so by his history after return;  He noted low grade fever, aching, cough, small amt yellow sput w/ blood streaks on one occas, and very SOB (seemed worse while sitting as opposed to walking);  He saw Tommye Standard and treated w/ an Albuterol inhaler- ?if any better w/ this;  He notes the SOB is more trying to get the air "IN", can't get a deep breath, not satisfied breathing & notes relief after a deep sigh breath or a coughing jag...       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      Current Meds>  Aten50, Amlod10, Lisin40, Lasix40 (hasn't taken due to work sched), Simva20, Uloric40, Colchicine0.6.Marland KitchenMarland Kitchen EXAM reveals Afeb, VSS, O2sat=97% on RA;  Heent- neg, mallampati1;  Chest- clear w/o w/r/r;  Heart- RR, gr1-2/6 SEM, S3 gallop rhythm, no rub;  Abd- obese, soft, neg;  Ext- trace edema w/o c/c/etc...  CXR today 07/15/15 shows borderline heart size, mild vasc congestion, low lung vols and basilar atx...   PFT 06/17/15 showed FVC=3.90 (75%), FEV1=2.88 (72%), %1sec=74, mid-flows reduced at 58% predicted;  post-bronchodil showed an 8% improvement in FEV1;  LungVols showed TLC=6.07 (86%), RV=1.97 (97%), DLCO=99%;  These are c/w some mild obstruction/ small airways disease...  Oxygen saturation test> O2sat= 97% on RA w/ pulse=89/min; he ambulated 3 laps in the office w/ lowest O2sat=96% w/ pulse=101/min....   LABS> only labs in Epic from 2008 (ok x BS=165);  LABS from [facility] 06/2015 showed CBC- wnl, TSH=2.11, Urate=8.6 (put on Uloric);  LABS by Cards 07/16/15- BMet- wnl x BS=116;  CBC- wnl;  Mag=1.9; Protime- wnl...  2DEcho done 07/16/15 showed LV mod dilated w/ severe decreased LVF- EF=15% w/ diffuse HK and AK anteroseptal/ apical/ inferior myocardium, decreased diastolic compliance, mildMR, LA severe dil, RV mild dil, PAsys=26mmHg...   EKG w/ NSR, rate91, LBBB NAD...   IMP/PLAN>>  His dyspnea appears multifactorial w/ major component from LV dysfuction/ Sys+Diast CHF and EF=15%; plus PH w/ PAsys=58mmHg by 2DEcho; plus an element of chest wall musc spasm giving him the sensation of can't get the air "IN" etc;  Finally PFT shows some small airways dis and we discussed trial Advair250Bid, Klonopin 0.5Bid, and med changes per Cards + additional eval=>  ADDENDUM>>  Pt had 2DEcho 07/16/15 w/ results above- he was seen urgently by [facility]-PA & DrKlein, meds adjusted w/ Aten=> changed to COREG 12.5Bid, resume LASIX40/d, add ASA81/d, NTG prn... They plan cardiac cath ASAP and  further considerations (AICD) pending this result...   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  The pt returns to see me today & has been working his handyman business again because  he understood the cardiologist to say that everything was fine at the cath & he proceed w/ normal activities after 5d; there was no f/u Cards appt arranged for the pt;  We reviewed the results of 2DEcho & the Cath and I indicated to him the serious nature of his nonischemic cardiomyopathy & LBBB-- he needs urgent appt in the CHF clinic & careful monitoring there w/ med adjustments, repeat 2DEcho and ultimately concideration of AICD...   As noted he is not resting well- he does snore but apparently not a restless sleeper, he estimates that he wakes refreshed ~50% of the time but does get sleepy in the afternoon & often naps, no problems working or driving he says; ESS is 40/98=> we will sched a sleep study for him... We reviewed the following medical problems during today's office visit >>  EXAM reveals Afeb, VSS, O2sat=96% on RA;  Heent- neg, mallampati1;  Chest- clear w/o w/r/r;  Heart- RR, gr1-2/6 SEM, S3 gallop rhythm, no rub;  Abd- obese, soft, neg;  Ext- VI, trace edema...    Dyspnea> multifactorial w/ LVD, EF=15%, acute on chr sys/ diast CHF, PH w/ PAsys est=65mmHg, RADS, chest wall musc spasm all playing a roll...    Likely component of RADS> PFT w/ small airways dis, Spirometry w/ 8% improvement after bronchodil => on ADVAIR250Bid.Marland KitchenMarland Kitchen    'Chest wall musc spasm'> inability to get the air "IN" etc => on KLONOPIN0.5mg  bid...    Obesity> weight is 272# near his max & BMI=39; needs diet, get wt down, later to join cardiac rehab...    Nonobstructive CAD, nonischemic cardiomyopathy, combined sys&diast CHF, LBBB> aggressive med management & referral to CHF clinic    Right heart dis & PH secondary to his left heart dis, r/o OSA etc>     Ven Insuffic & edema> on low sodium diet, Lasix40, hopefully Cards can wean the Amlod10 to lower dose w/ med adjustments...    Anxiety> on Klonopin0.5Bid due to dyspnea, stress w/ work & now health issues... We reviewed prob list, meds, xrays and labs>>  IMP/PLAN>>  Travis Cline  is stable, sl improved on medical management for his nonischemic cardiomyopathy but he has a long way to go & needs to get established in the CHF clinic=> we will set this up for him... In the meanwhile we will also sched a sleep study & call w/ the results when avail...    Past Medical History  Diagnosis Date  . Essential hypertension >> on Aten50, Amlod10, Lisin40, Lasix40    Nonischemic cardiomyopathy w/ EF=15-20%    LBBB    Pulmonary hypertension w/ PAsys=53 by 2DEcho   . Hyperlipemia >> on Simva20   . Erectile dysfunction   . Gout >> on Uloric40 & Colchicine0.6   . Insomnia   . Morbid obesity >> 5'10" Tall, Wt=272#, BMI=39       Abd Sonar 09/2012 showed severe fatty liver dis,  normal GB    Renal Stones >> bilat renal stones and left renal cyst     Past Surgical History  Procedure Laterality Date  . Cardiac catheterization N/A 07/21/2015    Procedure: Left Heart Cath and Coronary Angiography;  Surgeon: Lyn Records, MD;  Location: [facility] INVASIVE CV LAB;  Service: Cardiovascular;  Laterality: N/A;  UMBILICAL HERNIA REPAIR >> 11/2007 by DrTsuei   Outpatient Encounter Prescriptions as of 08/12/2015  Medication Sig  . albuterol (PROVENTIL HFA;VENTOLIN HFA) 108 (90 BASE) MCG/ACT inhaler Inhale 2 puffs into the lungs every 4 (four) hours as needed for wheezing or shortness of breath.  Marland Kitchen amLODipine (NORVASC) 10 MG tablet Take 10 mg by mouth daily.  Marland Kitchen aspirin EC 81 MG tablet Take 1 tablet (81 mg total) by mouth daily.  . carvedilol (COREG) 12.5 MG tablet Take 1 tablet (12.5 mg total) by mouth 2 (two) times daily.  . cimetidine (TAGAMET) 200 MG tablet Take 200 mg by mouth 2 (two) times daily as needed.  . clonazePAM (KLONOPIN) 0.5 MG tablet Take 1 tablet (0.5 mg total) by mouth 2 (two) times daily.  . colchicine 0.6 MG tablet Take 0.6 mg by mouth daily as needed.  . Fluticasone-Salmeterol (ADVAIR) 250-50 MCG/DOSE AEPB Inhale 1 puff into the lungs every 12 (twelve) hours.  . furosemide  (LASIX) 40 MG tablet Take 1 tablet (40 mg total) by mouth daily.  Marland Kitchen lisinopril (PRINIVIL,ZESTRIL) 40 MG tablet Take 40 mg by mouth daily.  . Multiple Vitamins-Minerals (MULTIVITAMIN WITH MINERALS) tablet Take 1 tablet by mouth daily.  . nitroGLYCERIN (NITROSTAT) 0.4 MG SL tablet Place 1 tablet (0.4 mg total) under the tongue every 5 (five) minutes as needed.  . simvastatin (ZOCOR) 20 MG tablet Take 20 mg by mouth daily.  Marland Kitchen ULORIC 40 MG tablet Take 40 mg by mouth daily.   No facility-administered encounter medications on file as of 08/12/2015.    No Known Allergies   Current Medications, Allergies, Past Medical History, Past Surgical History, Family History, and Social History were reviewed in [employer] record.   Review of Systems            All symptoms NEG except where BOLDED >>  Constitutional:  F/C/S, fatigue, anorexia, unexpected weight change. HEENT:  HA, visual changes, hearing loss, earache, nasal symptoms, sore throat, mouth sores, hoarseness. Resp:  cough, sputum, hemoptysis; SOB, tightness, wheezing. Cardio:  CP, palpit, DOE, orthopnea, edema. GI:  N/V/D/C, blood in stool; reflux, abd pain, distention, gas. GU:  dysuria, freq, urgency, hematuria, flank pain, voiding difficulty. MS:  joint pain, swelling, tenderness, decr ROM; neck pain, back pain, etc. Neuro:  HA, tremors, seizures, dizziness, syncope, weakness, numbness, gait abn. Skin:  suspicious lesions or skin rash. Heme:  adenopathy, bruising, bleeding. Psyche:  confusion, agitation, sleep disturbance, hallucinations, anxiety, depression suicidal.   Objective:   Physical Exam      Vital Signs:  Reviewed...  General:  WD, obese, 49 y/o WM in NAD; alert & oriented; pleasant & cooperative... HEENT:  /AT; Conjunctiva- pink, Sclera- nonicteric, EOM-wnl, PERRLA, EACs-clear, TMs-wnl; NOSE-clear; THROAT-clear & wnl. Neck:  Supple w/ fair ROM; no JVD; normal carotid impulses w/o bruits; no  thyromegaly or nodules palpated; no lymphadenopathy. Chest:  Clear to P & A; without wheezes, rales, or rhonchi heard. Heart:  Regular Rhythm; Gr1/6 SEM & S3 gallop rhythm heard, no rubs... Abdomen:  Obese, soft & nontender- no guarding or rebound; normal bowel sounds; no organomegaly or masses palpated. Ext:  Normal ROM; without deformities +arthritic changes; no varicose veins, +venous insuffic, tr edema;  Pulses intact w/o bruits. Neuro:  No focal neuro deficits; sensory testing normal; gait normal & balance OK. Derm:  No lesions noted; no rash etc. Lymph:  No cervical, supraclavicular, axillary, or inguinal adenopathy palpated.   Assessment:      IMP >>     Dyspnea> multifactorial w/ LVD, EF=15%, acute on chr sys/ diast CHF, PH w/ PAsys est=105mmHg, RADS, chest wall musc spasm all playing a roll...    Likely component of RADS> PFT w/ small airways dis, Spirmetry w/ 8% improvement after bronchodil => try ADVAIR250Bid...    Chest wall musc spasm> inability to get the air "IN" etc => trh KLONOPIN0.5mg  bid...    Obesity> weight is 272# near his max & BMI=39; needs diet, get wt down, later to join cardiac rehab...    CAD, ischemic cardiomyopathy, combined sys&diast CHF> apprec [facility]DS prompt response & service on behalf of MrFroning...    Right heart dis & PH secondary to his left heart dis, r/o OSA etc...    PLAN >>  07/16/15>  His dyspnea appears multifactorial w/ major component from LV dysfuction/ Sys+Diast CHF and EF=15%; plus PH w/ PAsys=44mmHg by 2DEcho; plus an element of chest wall musc spasm giving him the sensation of can't get the air "IN" etc;  Finally PFT shows some small airways dis and we discussed trial Advair250Bid, Klonopin 0.5Bid, and med changes per Cards + additional eval=>  ADDENDUM>>  Pt had 2DEcho 07/16/15 w/ results above- he was seen urgently by [facility]-PA & DrKlein, meds adjusted w/ Aten=> changed to COREG 12.5Bid, resume LASIX40/d, add ASA81/d, NTG prn... They plan cardiac  cath ASAP and further considerations (AICD) pending this result... 08/12/15>  Travis Cline is stable, sl improved on medical management for his nonischemic cardiomyopathy but he has a long way to go & needs to get established in the CHF clinic=> we will set this up for him... In the meanwhile we will also sched a sleep study & call w/ the results when avail...     Plan:     Patient's Medications  New Prescriptions   No medications on file  Previous Medications   ALBUTEROL (PROVENTIL HFA;VENTOLIN HFA) 108 (90 BASE) MCG/ACT INHALER    Inhale 2 puffs into the lungs every 4 (four) hours as needed for wheezing or shortness of breath.   AMLODIPINE (NORVASC) 10 MG TABLET    Take 10 mg by mouth daily.   ASPIRIN EC 81 MG TABLET    Take 1 tablet (81 mg total) by mouth daily.   CARVEDILOL (COREG) 12.5 MG TABLET    Take 1 tablet (12.5 mg total) by mouth 2 (two) times daily.   CIMETIDINE (TAGAMET) 200 MG TABLET    Take 200 mg by mouth 2 (two) times daily as needed.   CLONAZEPAM (KLONOPIN) 0.5 MG TABLET    Take 1 tablet (0.5 mg total) by mouth 2 (two) times daily.   COLCHICINE 0.6 MG TABLET    Take 0.6 mg by mouth daily as needed.   FLUTICASONE-SALMETEROL (ADVAIR) 250-50 MCG/DOSE AEPB    Inhale 1 puff into the lungs every 12 (twelve) hours.   FUROSEMIDE (LASIX) 40 MG TABLET    Take 1 tablet (40 mg total) by mouth daily.   LISINOPRIL (PRINIVIL,ZESTRIL) 40 MG TABLET    Take 40 mg by mouth daily.   MULTIPLE VITAMINS-MINERALS (MULTIVITAMIN WITH MINERALS) TABLET    Take 1 tablet by mouth daily.  NITROGLYCERIN (NITROSTAT) 0.4 MG SL TABLET    Place 1 tablet (0.4 mg total) under the tongue every 5 (five) minutes as needed.   SIMVASTATIN (ZOCOR) 20 MG TABLET    Take 20 mg by mouth daily.   ULORIC 40 MG TABLET    Take 40 mg by mouth daily.  Modified Medications   No medications on file  Discontinued Medications   No medications on file

## 2015-08-12 NOTE — Patient Instructions (Signed)
Today we updated your med list in our EPIC system...    Continue your current medications the same...  We will arrange for an appt in the CHF clinic w/ DrBensimhon et al...    And a sleep study will be scheduled at your convenience (our [facility] will called w/ this appt)  Call for any question or if I can be of service in any way...  Let's plan a follow up visit in 84mo, sooner if needed for problems.Marland KitchenMarland Kitchen

## 2015-08-25 ENCOUNTER — Encounter (HOSPITAL_COMMUNITY): Payer: Self-pay

## 2015-08-25 ENCOUNTER — Ambulatory Visit (HOSPITAL_COMMUNITY)
Admission: RE | Admit: 2015-08-25 | Discharge: 2015-08-25 | Disposition: A | Discharge status: Home / Self Care | Payer: BC Managed Care – PPO | Source: Ambulatory Visit | Attending: Cardiology | Admitting: Cardiology

## 2015-08-25 VITALS — BP 116/70 | HR 92 | Wt 271.5 lb

## 2015-08-25 DIAGNOSIS — I10 Essential (primary) hypertension: Secondary | ICD-10-CM | POA: Insufficient documentation

## 2015-08-25 DIAGNOSIS — I5022 Chronic systolic (congestive) heart failure: Secondary | ICD-10-CM | POA: Diagnosis not present

## 2015-08-25 DIAGNOSIS — I447 Left bundle-branch block, unspecified: Secondary | ICD-10-CM | POA: Diagnosis not present

## 2015-08-25 DIAGNOSIS — M109 Gout, unspecified: Secondary | ICD-10-CM | POA: Diagnosis not present

## 2015-08-25 MED ORDER — CARVEDILOL 12.5 MG PO TABS: 18.7500 mg | ORAL_TABLET | Freq: Two times a day (BID) | ORAL | Status: DC

## 2015-08-25 MED ORDER — SPIRONOLACTONE 25 MG PO TABS: 12.5000 mg | ORAL_TABLET | Freq: Every day | ORAL | Status: DC

## 2015-08-25 NOTE — Progress Notes (Signed)
Advanced Heart Failure Medication Review by a Pharmacist  Does the patient  feel that his/her medications are working for him/her?  yes  Has the patient been experiencing any side effects to the medications prescribed?  no  Does the patient measure his/her own blood pressure or blood glucose at home?  no   Does the patient have any problems obtaining medications due to transportation or finances?   no  Understanding of regimen: good Understanding of indications: good Potential of compliance: good    Pharmacist comments:  Travis Cline is a pleasant 49 yo M presenting with an up-to-date medication list. He reports excellent compliance with all of his medications and did not have any specific medication-related questions or concerns for me today.  Travis Cline. Bonnye Fava, PharmD, BCPS, CPP Clinical Pharmacist Pager: [phone] Phone: [phone] 08/25/2015 3:06 PM

## 2015-08-25 NOTE — Patient Instructions (Signed)
FOLLOW UP: 2 months  START Spironolactone 12.5 mg (1/2 tablet) daily.  IN 2 WEEKS INCREASE Carvedilol to 18.75mg  (1 &1/2 tablets) daily.  HAVE LAB WORK DRAWN AT PCP NEXT WEEK.

## 2015-08-25 NOTE — Progress Notes (Signed)
Patient ID: Travis Cline, male   DOB: [birth year], 49 y.o.   MRN: [id number]   PCP: Dr Cam Hai  Primary Cardiologist: Dr Graciela Husbands   HPI: Mr Levan is a 27 year old with a history of HTN, hyperlipidemia, obesity, LBBB, nonobstructive CAD and recently diagnosed systolic HF due to NICM caridomyopathy with EF 15%.  Over the last year he noticed progressive dyspnea with exertion. In January, PCP recommended albuterol with no improvement. He has been traveling to [place] for prolonged visits. In April he followed up with PCP and thought  He had flu with body aches. He continued to decline and went back to PCP then referred to Dr Kriste Basque. ECHO showed EF 15%. At that point he was referred to cardiology. On August 3rd he saw Dr Graciela Husbands and was switched from atenolol to carvedilol.   Today he presents as new patient referred by Dr Graciela Husbands. He is feeling much better. Only mild DOE. Denies CP/PND/edema.  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 Snores on occasion. Stopped drinking a month ago but never drank more than 2-3 beers/day. Taking all medications.   EKG 07/21/15 NSR 92 bpm LBBB QRS 170   LHC 8/6   Prox LAD lesion, 35% stenosed.  Prox Cx to Mid Cx lesion, 30% stenosed.  There is severe left ventricular systolic dysfunction  ECHO 07/2015 -->EF 15%. RV mildly dilated. Peak PA pressure 54 mm hg   CXR  07/15/15 shows borderline heart size, mild vasc congestion, low lung vols and basilar atx...   PFT 06/17/15 showed FVC=3.90 (75%), FEV1=2.88 (72%), %1sec=74, mid-flows reduced at 58% predicted; post-bronchodil showed an 8% improvement in FEV1; LungVols showed TLC=6.07 (86%), RV=1.97 (97%), DLCO=99%; These are c/w some mild obstruction/ small airways disease...  Labs 07/16/2015 K 4.5 Creatinine 0.95  Hgb 17.3   SH:  Social History   Social History  . Marital Status: Married    Spouse Name: N/A  . Number of Children: N/A  . Years of Education: N/A    Occupational History  . Not on file.   Social History Main Topics  . Smoking status: Never Smoker   . Smokeless tobacco: Not on file  . Alcohol Use: 0.6 - 1.2 oz/week    1-2 Standard drinks or equivalent per week     Comment: was previously drinking 2-3 beers/day - currently drinking about 4 beers/month.  . Drug Use: No  . Sexual Activity: Not on file   Other Topics Concern  . Not on file   Social History Narrative    He lives in [place] with his wife and 2 stepchildren. He has 2 children of his own who live in  [place]. He does not routinely exercise. 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    FH:  Family History  Problem Relation Age of Onset  . Other Father     alive & well @ 68.  . Other Mother     alive & well @ 46.  . Other      siblings alive and well.    Past Medical History  Diagnosis Date  . Essential hypertension   . Hyperlipemia   . Erectile dysfunction   . Gout   . Insomnia   . Morbid obesity   . Cardiomyopathy     a.  06/2015  echo: EF 15%, diffuse hypokinesis, anteroseptal/apical akinesis. Mild mitral regurgitation. Severely  dilated left atrium. Mildly reduced RV function. ulnar artery systolic pressure 54 mmHg.    Current Outpatient Prescriptions  Medication Sig Dispense Refill  . albuterol (PROVENTIL HFA;VENTOLIN HFA) 108 (90 BASE) MCG/ACT inhaler Inhale 2 puffs into the lungs every 4 (four) hours as needed for wheezing or shortness of breath.    Marland Kitchen amLODipine (NORVASC) 10 MG tablet Take 10 mg by mouth daily.    Marland Kitchen aspirin EC 81 MG tablet Take 1 tablet (81 mg total) by mouth daily.    . carvedilol (COREG) 12.5 MG tablet Take 1 tablet (12.5 mg total) by mouth 2 (two) times daily. 60 tablet 11  . cimetidine (TAGAMET) 200 MG tablet Take 200 mg by mouth 2 (two) times daily as needed.    . clonazePAM (KLONOPIN) 0.5 MG tablet Take 1 tablet (0.5 mg total) by mouth 2 (two) times  daily. 60 tablet 0  . colchicine 0.6 MG tablet Take 0.6 mg by mouth daily as needed.    . Fluticasone-Salmeterol (ADVAIR) 250-50 MCG/DOSE AEPB Inhale 1 puff into the lungs every 12 (twelve) hours. 60 each 11  . furosemide (LASIX) 40 MG tablet Take 1 tablet (40 mg total) by mouth daily. 30 tablet 11  . lisinopril (PRINIVIL,ZESTRIL) 40 MG tablet Take 40 mg by mouth daily.    . Multiple Vitamins-Minerals (MULTIVITAMIN WITH MINERALS) tablet Take 1 tablet by mouth daily.    . nitroGLYCERIN (NITROSTAT) 0.4 MG SL tablet Place 1 tablet (0.4 mg total) under the tongue every 5 (five) minutes as needed. 25 tablet 3  . simvastatin (ZOCOR) 20 MG tablet Take 20 mg by mouth daily.    Marland Kitchen ULORIC 40 MG tablet Take 40 mg by mouth daily.  3   No current facility-administered medications for this encounter.    Filed Vitals:   08/25/15 1457  BP: 116/70  Pulse: 92  Weight: 271 lb 8 oz (123.152 kg)  SpO2: 98%    PHYSICAL EXAM:  General:  Well appearing. No resp difficulty HEENT: normal Neck: supple. JVP 8-9  Carotids 2+ bilaterally; no bruits. No lymphadenopathy or thryomegaly appreciated. Cor: PMI normal. Regular rate & rhythm.Wide split s2  No rubs, gallops or murmurs. Lungs: clear Abdomen: soft, nontender, nondistended. No hepatosplenomegaly. No bruits or masses. Good bowel sounds. Extremities: no cyanosis, clubbing, rash, R and LLE trace -1 + edema Neuro: alert & orientedx3, cranial nerves grossly intact. Moves all 4 extremities w/o difficulty. Affect pleasant.   ASSESSMENT & PLAN:  1. Chronic Systolic HF NICM. Had LHC 8/8 with nonobstructive CAD.   Bedside ECHO today. EF 20-25% Septum dyskinetic RV mildly hypokinetic from LBBB. NICM.  NYHA II. Volume status mildly elevated. Continue lasix 40 mg daily -Continue carvedilol 12.5 mg twice a day--> In 2 weeks increase to 18.75 mg twice a day -Continue lisinopril 40 mg daily- offered entresto but he declined due to cost.  - Add spiro 12.5 mg daily.  Check BMET next week.  Hold off on CMRI for now.  Has wide QRS so if EF does not improve will need CRT-D  Discussed daily weights, low salt foods, and limiting fluids to < 2 liters per day.  2. LBBB 3. HTN- BP controlled on current regimen  4. Gout - Continue current regimen per PCP 5. ETOH- Stopped drinking beer a month ago but prior to that was drinking 3 beers a day.   Follow up in 2 months.  CLEGG,AMY NP-C  3:32 PM  Patient seen and examined with Tonye Becket, NP. We discussed  all aspects of the encounter. I agree with the assessment and plan as stated above.   Overall much improved. i did bedside echo today and EF still 20-25% due to NICM with unclear etiology (related to LBBB?). Mildly volume overloaded on exam. Will add spiro 12.5. Will also have him titrate carvedilol over the next 2 weeks. Currently would like to defer cMRI and frequent visits due to financial situation. Will see him back in 2 months or so. Will investigate whether we can get him Entresto to replace lisinopril   Bensimhon, Kaydence,MD 10:30 PM

## 2015-08-26 DIAGNOSIS — I447 Left bundle-branch block, unspecified: Secondary | ICD-10-CM | POA: Insufficient documentation

## 2015-09-18 ENCOUNTER — Other Ambulatory Visit (HOSPITAL_COMMUNITY): Payer: Self-pay | Admitting: Adult Health

## 2015-10-13 ENCOUNTER — Ambulatory Visit: Payer: BC Managed Care – PPO | Admitting: Pulmonary Disease

## 2015-10-28 ENCOUNTER — Encounter (HOSPITAL_COMMUNITY): Payer: Self-pay

## 2015-10-28 ENCOUNTER — Ambulatory Visit (HOSPITAL_COMMUNITY)
Admission: RE | Admit: 2015-10-28 | Discharge: 2015-10-28 | Disposition: A | Discharge status: Home / Self Care | Payer: BC Managed Care – PPO | Source: Ambulatory Visit | Attending: Internal Medicine | Admitting: Internal Medicine

## 2015-10-28 VITALS — BP 126/82 | HR 82 | Wt 277.2 lb

## 2015-10-28 DIAGNOSIS — I2510 Atherosclerotic heart disease of native coronary artery without angina pectoris: Secondary | ICD-10-CM | POA: Diagnosis not present

## 2015-10-28 DIAGNOSIS — Z7982 Long term (current) use of aspirin: Secondary | ICD-10-CM | POA: Diagnosis not present

## 2015-10-28 DIAGNOSIS — I10 Essential (primary) hypertension: Secondary | ICD-10-CM | POA: Diagnosis not present

## 2015-10-28 DIAGNOSIS — I447 Left bundle-branch block, unspecified: Secondary | ICD-10-CM

## 2015-10-28 DIAGNOSIS — Z79899 Other long term (current) drug therapy: Secondary | ICD-10-CM | POA: Insufficient documentation

## 2015-10-28 DIAGNOSIS — E785 Hyperlipidemia, unspecified: Secondary | ICD-10-CM | POA: Insufficient documentation

## 2015-10-28 DIAGNOSIS — I5022 Chronic systolic (congestive) heart failure: Secondary | ICD-10-CM

## 2015-10-28 DIAGNOSIS — I428 Other cardiomyopathies: Secondary | ICD-10-CM | POA: Insufficient documentation

## 2015-10-28 DIAGNOSIS — M109 Gout, unspecified: Secondary | ICD-10-CM | POA: Insufficient documentation

## 2015-10-28 DIAGNOSIS — I110 Hypertensive heart disease with heart failure: Secondary | ICD-10-CM | POA: Diagnosis not present

## 2015-10-28 DIAGNOSIS — I429 Cardiomyopathy, unspecified: Secondary | ICD-10-CM

## 2015-10-28 LAB — BASIC METABOLIC PANEL
ANION GAP: 8 (ref 5–15)
BUN: 16 mg/dL (ref 6–20)
CALCIUM: 9.7 mg/dL (ref 8.9–10.3)
CO2: 22 mmol/L (ref 22–32)
Chloride: 108 mmol/L (ref 101–111)
Creatinine, Ser: 0.93 mg/dL (ref 0.61–1.24)
Glucose, Bld: 93 mg/dL (ref 65–99)
POTASSIUM: 4.7 mmol/L (ref 3.5–5.1)
Sodium: 138 mmol/L (ref 135–145)

## 2015-10-28 MED ORDER — LISINOPRIL 40 MG PO TABS: 40.0000 mg | ORAL_TABLET | Freq: Every day | ORAL | Status: DC

## 2015-10-28 MED ORDER — CARVEDILOL 25 MG PO TABS: 25.0000 mg | ORAL_TABLET | Freq: Two times a day (BID) | ORAL | Status: DC

## 2015-10-28 NOTE — Progress Notes (Signed)
Patient ID: CASHTYN TONKS, male   DOB: March 25, 1966, 49 y.o.   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  Over the last year he noticed progressive dyspnea with exertion. In January, PCP recommended albuterol with no improvement. He has been traveling to [place] for prolonged visits. In April he followed up with PCP and thought  He had flu with body aches. He continued to decline and went back to PCP then referred to Dr Kriste Basque. ECHO showed EF 15%. At that point he was referred to cardiology. On August 3rd he saw Dr Graciela Husbands and was switched from atenolol to carvedilol.   Today he presents for HF follow up. Last visit carvedilol was increased and started on 18.75 and spiro was added to 12.5  Mg twice daily. Mild dyspnea when  walking briskly and with steps. Not weighing daily. Taking lasix most days but says he wont take on the days he works.  Taking all medications. Rarely drinks alcohol. 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    EKG 07/21/15 NSR 92 bpm LBBB QRS 170   LHC 8/6   Prox LAD lesion, 35% stenosed.  Prox Cx to Mid Cx lesion, 30% stenosed.  There is severe left ventricular systolic dysfunction  ECHO 07/2015 -->EF 15%. RV mildly dilated. Peak PA pressure 54 mm hg   CXR  07/15/15 shows borderline heart size, mild vasc congestion, low lung vols and basilar atx...   PFT 06/17/15 showed FVC=3.90 (75%), FEV1=2.88 (72%), %1sec=74, mid-flows reduced at 58% predicted; post-bronchodil showed an 8% improvement in FEV1; LungVols showed TLC=6.07 (86%), RV=1.97 (97%), DLCO=99%; These are c/w some mild obstruction/ small airways disease...  Labs 07/16/2015 K 4.5 Creatinine 0.95  Hgb 17.3  Labs 09/12/2015: K 5.3 Creatinine 1.01  SH:  Social History   Social History  . Marital  Status: Married    Spouse Name: N/A  . Number of Children: N/A  . Years of Education: N/A   Occupational History  . Not on file.   Social History Main Topics  . Smoking status: Never Smoker   . Smokeless tobacco: Not on file  . Alcohol Use: 0.6 - 1.2 oz/week    1-2 Standard drinks or equivalent per week     Comment: was previously drinking 2-3 beers/day - currently drinking about 4 beers/month.  . Drug Use: No  . Sexual Activity: Not on file   Other Topics Concern  . Not on file   Social History Narrative    He lives in [place] with his wife and 2 stepchildren. He has 2 children of his own who live in  [place]. He does not routinely exercise. 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    FH:  Family History  Problem Relation Age of Onset  . Other Father     alive & well @ 47.  . Other Mother     alive & well @ 65.  . Other      siblings alive and well.    Past Medical History  Diagnosis Date  . Essential hypertension   . Hyperlipemia   . Erectile dysfunction   . Gout   . Insomnia   . Morbid obesity (HCC)   .  Cardiomyopathy (HCC)     a.  06/2015  echo: EF 15%, diffuse hypokinesis, anteroseptal/apical akinesis. Mild mitral regurgitation. Severely dilated left atrium. Mildly reduced RV function. ulnar artery systolic pressure 54 mmHg.    Current Outpatient Prescriptions  Medication Sig Dispense Refill  . albuterol (PROVENTIL HFA;VENTOLIN HFA) 108 (90 BASE) MCG/ACT inhaler Inhale 2 puffs into the lungs every 4 (four) hours as needed for wheezing or shortness of breath.    Marland Kitchen amLODipine (NORVASC) 10 MG tablet Take 10 mg by mouth daily.    Marland Kitchen aspirin EC 81 MG tablet Take 1 tablet (81 mg total) by mouth daily.    . carvedilol (COREG) 12.5 MG tablet Take 1.5 tablets (18.75 mg total) by mouth 2 (two) times daily. 90 tablet 3  . cimetidine (TAGAMET) 200 MG tablet Take 200 mg by mouth 2 (two) times daily as  needed.    . colchicine 0.6 MG tablet Take 0.6 mg by mouth daily as needed.    . Fluticasone-Salmeterol (ADVAIR) 250-50 MCG/DOSE AEPB Inhale 1 puff into the lungs every 12 (twelve) hours. 60 each 11  . furosemide (LASIX) 40 MG tablet Take 1 tablet (40 mg total) by mouth daily. 30 tablet 11  . lisinopril (PRINIVIL,ZESTRIL) 40 MG tablet Take 40 mg by mouth daily.    . Multiple Vitamins-Minerals (MULTIVITAMIN WITH MINERALS) tablet Take 1 tablet by mouth daily.    . simvastatin (ZOCOR) 20 MG tablet Take 20 mg by mouth daily.    Marland Kitchen spironolactone (ALDACTONE) 25 MG tablet Take 0.5 tablets (12.5 mg total) by mouth daily. 15 tablet 3  . ULORIC 40 MG tablet Take 40 mg by mouth daily.  3  . nitroGLYCERIN (NITROSTAT) 0.4 MG SL tablet Place 1 tablet (0.4 mg total) under the tongue every 5 (five) minutes as needed. (Patient not taking: Reported on 08/25/2015) 25 tablet 3   No current facility-administered medications for this encounter.    Filed Vitals:   10/28/15 1000  BP: 126/82  Pulse: 82  Weight: 277 lb 4 oz (125.76 kg)  SpO2: 98%    PHYSICAL EXAM:  General:  Well appearing. No resp difficulty. Ambulated in the clinic without difficulty.  HEENT: normal Neck: supple. JVP 7-8  Carotids 2+ bilaterally; no bruits. No lymphadenopathy or thryomegaly appreciated. Cor: PMI normal. Regular rate & rhythm.Wide split s2  No rubs, gallops or murmurs. Lungs: clear Abdomen: soft, nontender, nondistended. No hepatosplenomegaly. No bruits or masses. Good bowel sounds. Extremities: no cyanosis, clubbing, rash, R and LLE trace  edema Neuro: alert & orientedx3, cranial nerves grossly intact. Moves all 4 extremities w/o difficulty. Affect pleasant.   ASSESSMENT & PLAN:  1. Chronic Systolic HF NICM. Had LHC 8/8 with nonobstructive CAD.   Bedside ECHO today. EF 20-25% Septum dyskinetic RV mildly hypokinetic from LBBB. NICM.  NYHA II. Volume status mildly elevated. Continue lasix 40 mg daily. Needs to weigh  daily. We discussed.  -Increase carvedilol to 25 mg twice a day.  -Continue lisinopril 40 mg daily- offered entresto but againdeclined due to cost.  - K from PCP running high. Continue spiro 12.5 mg daily. Check BMET today.  - Offered CMri however he declines due to cost.  -Check ECHO next visit. Has wide QRS so if EF does not improve will need CRT-D . He is aware and will consider.  Discussed daily weights, low salt foods, and limiting fluids to < 2 liters per day.  2. LBBB 3. HTN- Controlled.   4. Gout - Continue current regimen per  PCP 5. ETOH- Former heavy drinker. Rarely drinking alcohol.   Follow up in 4 weeks with an ECHO and Dr Gala Romney    Jose Alleyne NP-C  10:04 AM

## 2015-10-28 NOTE — Patient Instructions (Signed)
INCREASE Coreg to 25mg  twice a day. (new prescription sent to pharmacy)  FOLLOW UP: 4 weeks w/Dr.Bensimhon (with ECHO)  Routine lab work today. (bmet) Will notify you of abnormal results  Do the following things EVERYDAY: 1) Weigh yourself in the morning before breakfast. Write it down and keep it in a log. 2) Take your medicines as prescribed 3) Eat low salt foods-Limit salt (sodium) to 2000 mg per day.  4) Stay as active as you can everyday 5) Limit all fluids for the day to less than 2 liters

## 2015-10-29 ENCOUNTER — Encounter (HOSPITAL_BASED_OUTPATIENT_CLINIC_OR_DEPARTMENT_OTHER): Payer: BC Managed Care – PPO

## 2015-11-28 ENCOUNTER — Encounter (HOSPITAL_COMMUNITY): Payer: Self-pay | Admitting: Internal Medicine

## 2015-11-28 ENCOUNTER — Ambulatory Visit (HOSPITAL_BASED_OUTPATIENT_CLINIC_OR_DEPARTMENT_OTHER)
Admission: RE | Admit: 2015-11-28 | Discharge: 2015-11-28 | Disposition: A | Discharge status: Home / Self Care | Payer: BC Managed Care – PPO | Source: Ambulatory Visit | Attending: Internal Medicine | Admitting: Internal Medicine

## 2015-11-28 ENCOUNTER — Ambulatory Visit (HOSPITAL_COMMUNITY)
Admission: RE | Admit: 2015-11-28 | Discharge: 2015-11-28 | Disposition: A | Discharge status: Home / Self Care | Payer: BC Managed Care – PPO | Source: Ambulatory Visit | Attending: Internal Medicine | Admitting: Internal Medicine

## 2015-11-28 VITALS — BP 128/72 | HR 84 | Wt 278.0 lb

## 2015-11-28 DIAGNOSIS — I517 Cardiomegaly: Secondary | ICD-10-CM | POA: Diagnosis not present

## 2015-11-28 DIAGNOSIS — R29898 Other symptoms and signs involving the musculoskeletal system: Secondary | ICD-10-CM | POA: Diagnosis not present

## 2015-11-28 DIAGNOSIS — E785 Hyperlipidemia, unspecified: Secondary | ICD-10-CM | POA: Diagnosis not present

## 2015-11-28 DIAGNOSIS — I351 Nonrheumatic aortic (valve) insufficiency: Secondary | ICD-10-CM | POA: Diagnosis not present

## 2015-11-28 DIAGNOSIS — I10 Essential (primary) hypertension: Secondary | ICD-10-CM | POA: Insufficient documentation

## 2015-11-28 DIAGNOSIS — I071 Rheumatic tricuspid insufficiency: Secondary | ICD-10-CM | POA: Insufficient documentation

## 2015-11-28 DIAGNOSIS — I447 Left bundle-branch block, unspecified: Secondary | ICD-10-CM | POA: Diagnosis not present

## 2015-11-28 DIAGNOSIS — I340 Nonrheumatic mitral (valve) insufficiency: Secondary | ICD-10-CM | POA: Diagnosis not present

## 2015-11-28 DIAGNOSIS — I5022 Chronic systolic (congestive) heart failure: Secondary | ICD-10-CM | POA: Diagnosis not present

## 2015-11-28 DIAGNOSIS — I509 Heart failure, unspecified: Secondary | ICD-10-CM | POA: Diagnosis present

## 2015-11-28 DIAGNOSIS — F1010 Alcohol abuse, uncomplicated: Secondary | ICD-10-CM

## 2015-11-28 LAB — BASIC METABOLIC PANEL
ANION GAP: 12 (ref 5–15)
BUN: 21 mg/dL — AB (ref 6–20)
CALCIUM: 10.1 mg/dL (ref 8.9–10.3)
CO2: 19 mmol/L — AB (ref 22–32)
CREATININE: 1.13 mg/dL (ref 0.61–1.24)
Chloride: 107 mmol/L (ref 101–111)
GFR calc Af Amer: >60 mL/min (ref >60)
GLUCOSE: 89 mg/dL (ref 65–99)
Potassium: 5.1 mmol/L (ref 3.5–5.1)
Sodium: 138 mmol/L (ref 135–145)

## 2015-11-28 MED ORDER — SACUBITRIL-VALSARTAN 97-103 MG PO TABS: 1.0000 | ORAL_TABLET | Freq: Two times a day (BID) | ORAL | Status: DC

## 2015-11-28 NOTE — Progress Notes (Signed)
  Echocardiogram 2D Echocardiogram has been performed.  Arvil Chaco 11/28/2015, 11:59 AM

## 2015-11-28 NOTE — Patient Instructions (Signed)
Stop Lisinopril  Start Entresto 97/103 mg Twice daily, we have provided you a 30 day free card and an co-pay card to assist with the cost  Stop Advair  Labs today  Your physician recommends that you schedule a follow-up appointment in: 6 weeks

## 2015-11-28 NOTE — Progress Notes (Signed)
ADVANCED HF CLINIC NOTE  Patient ID: Travis Cline, male   DOB: 23-Mar-1966, 49 y.o.   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 Cath with non-obstructive CAD.   Today he presents for HF follow up. Last visit carvedilol was increased to 25mg  bid. Says he feels "tons better". Remains active. SOB with walking briskly or with steps but has always had this. No edema, orthopnea or PND. Not weighing daily. Taking lasix most days.  Taking all medications otherwise. Rarely drinks alcohol. 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 No palpitations or syncope.   Echo reviewed personally today EF ~25%. RV ok    EKG 07/21/15 NSR 92 bpm LBBB QRS 170   LHC 8/6   Prox LAD lesion, 35% stenosed.  Prox Cx to Mid Cx lesion, 30% stenosed.  There is severe left ventricular systolic dysfunction  ECHO 07/2015 -->EF 15%. RV mildly dilated. Peak PA pressure 54 mm hg   CXR  07/15/15 shows borderline heart size, mild vasc congestion, low lung vols and basilar atx...   PFT 06/17/15 showed FVC=3.90 (75%), FEV1=2.88 (72%), %1sec=74, mid-flows reduced at 58% predicted; post-bronchodil showed an 8% improvement in FEV1; LungVols showed TLC=6.07 (86%), RV=1.97 (97%), DLCO=99%; These are c/w some mild obstruction/ small airways disease...  Labs 07/16/2015 K 4.5 Creatinine 0.95  Hgb 17.3  Labs 09/12/2015: K 5.3 Creatinine 1.01   Labs 10/28/2015: K 54.7 Creatinine 0.93  SH:  Social History   Social History  . Marital Status: Married    Spouse Name: N/A  . Number of Children: N/A  . Years of Education: N/A   Occupational History  . Not on file.   Social History Main Topics  . Smoking status: Never Smoker   . Smokeless tobacco: Not on file  . Alcohol Use: 0.6 - 1.2 oz/week    1-2 Standard drinks  or equivalent per week     Comment: was previously drinking 2-3 beers/day - currently drinking about 4 beers/month.  . Drug Use: No  . Sexual Activity: Not on file   Other Topics Concern  . Not on file   Social History Narrative    He lives in [place] with his wife and 2 stepchildren. He has 2 children of his own who live in  [place]. He does not routinely exercise. 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    FH:  Family History  Problem Relation Age of Onset  . Other Father     alive & well @ 59.  . Other Mother     alive & well @ 79.  . Other      siblings alive and well.    Past Medical History  Diagnosis Date  . Essential hypertension   . Hyperlipemia   . Erectile dysfunction   . Gout   . Insomnia   . Morbid obesity (HCC)   . Cardiomyopathy (HCC)     a.  06/2015  echo: EF 15%, diffuse hypokinesis, anteroseptal/apical akinesis. Mild mitral regurgitation. Severely dilated left atrium. Mildly reduced RV function. ulnar artery systolic pressure 54 mmHg.    Current Outpatient Prescriptions  Medication Sig Dispense Refill  . albuterol (PROVENTIL HFA;VENTOLIN HFA) 108 (90 BASE) MCG/ACT inhaler Inhale  2 puffs into the lungs every 4 (four) hours as needed for wheezing or shortness of breath.    Marland Kitchen amLODipine (NORVASC) 10 MG tablet Take 10 mg by mouth daily.    Marland Kitchen aspirin EC 81 MG tablet Take 1 tablet (81 mg total) by mouth daily.    . carvedilol (COREG) 25 MG tablet Take 1 tablet (25 mg total) by mouth 2 (two) times daily. 60 tablet 6  . cimetidine (TAGAMET) 200 MG tablet Take 200 mg by mouth 2 (two) times daily as needed.    . colchicine 0.6 MG tablet Take 0.6 mg by mouth daily as needed.    . Fluticasone-Salmeterol (ADVAIR) 250-50 MCG/DOSE AEPB Inhale 1 puff into the lungs every 12 (twelve) hours. 60 each 11  . furosemide (LASIX) 40 MG tablet Take 1 tablet (40 mg total) by mouth daily. 30 tablet 11  .  lisinopril (PRINIVIL,ZESTRIL) 40 MG tablet Take 1 tablet (40 mg total) by mouth daily. 30 tablet 6  . Multiple Vitamins-Minerals (MULTIVITAMIN WITH MINERALS) tablet Take 1 tablet by mouth daily.    . nitroGLYCERIN (NITROSTAT) 0.4 MG SL tablet Place 1 tablet (0.4 mg total) under the tongue every 5 (five) minutes as needed. 25 tablet 3  . simvastatin (ZOCOR) 20 MG tablet Take 20 mg by mouth daily.    Marland Kitchen spironolactone (ALDACTONE) 25 MG tablet Take 0.5 tablets (12.5 mg total) by mouth daily. 15 tablet 3  . ULORIC 40 MG tablet Take 40 mg by mouth daily.  3   No current facility-administered medications for this encounter.    Filed Vitals:   11/28/15 1206  BP: 128/72  Pulse: 84  Weight: 278 lb (126.1 kg)  SpO2: 97%    PHYSICAL EXAM:  General:  Well appearing. No resp difficulty. Ambulated in the clinic without difficulty.  HEENT: normal Neck: supple. JVP 7-8  Carotids 2+ bilaterally; no bruits. No lymphadenopathy or thryomegaly appreciated. Cor: PMI normal. Regular rate & rhythm.Wide split s2  No rubs, gallops or murmurs. Lungs: clear Abdomen: soft, nontender, nondistended. No hepatosplenomegaly. No bruits or masses. Good bowel sounds. Extremities: no cyanosis, clubbing, rash, edema Neuro: alert & orientedx3, cranial nerves grossly intact. Moves all 4 extremities w/o difficulty. Affect pleasant.   ASSESSMENT & PLAN:  1. Chronic Systolic HF NICM. Had LHC 8/8 with nonobstructive CAD.   -Symptomatically much improved NYHA I. Volume status looks good.  - ECHO today. Images reviewed personally. EF 20-25% Septum dyskinetic from LBBB RV mildly hypokinetic - Continue carvedilol to 25 mg twice a day.  - Continue lisinopril 40 mg daily- offered entresto but againdeclined due to cost.  - Continue spiro 12.5 mg daily. Check BMET today.  - With EF still down and wide LBBB we discussed possibility of CRT-D (though he is NYHA I-II). He would like to avoid if possible. We will proceed with cMRI to  clearly quantify EF and look for other structural issues or infiltrative process.  Discussed daily weights, low salt foods, and limiting fluids to < 2 liters per day.  2. LBBB 3. HTN- Controlled.   4. Gout - Continue current regimen per PCP 5. ETOH- Former heavy drinker. Rarely drinking alcohol.   Arvilla Meres MD  12:11 PM

## 2015-12-05 DIAGNOSIS — F1010 Alcohol abuse, uncomplicated: Secondary | ICD-10-CM | POA: Insufficient documentation

## 2015-12-27 ENCOUNTER — Other Ambulatory Visit (HOSPITAL_COMMUNITY): Payer: Self-pay | Admitting: Adult Health

## 2016-01-03 ENCOUNTER — Telehealth: Payer: Self-pay | Admitting: Physician Assistant

## 2016-01-03 NOTE — Telephone Encounter (Signed)
Patient called answering service inquiring about Entresto. The pharmacy would only give him a few pills to get him through until his insurance could authorize the med. He states he is waiting for CHF office to finish paperwork on prior authorization. I do not have access to these documents since it is the weekend and the office is closed. He asked if I had any free samples - again, unfortunately I do not, since it is the weekend and the office is closed. I asked the patient to see if the pharmacy could dispense short-term supply to get through tomorrow but he said they could not. I d/w Dr. Shirlee Latch who agrees options are limited - he would not r/s lisinopril at this time given short duration of being out of Entresto. I advised patient to call the CHF clinic first thing on Monday AM to see what status is for prior authorization and obtain further samples. The patient verbalized understanding and gratitude. Abednego Yeates PA-C

## 2016-01-06 ENCOUNTER — Telehealth (HOSPITAL_COMMUNITY): Payer: Self-pay | Admitting: Pharmacist

## 2016-01-06 NOTE — Telephone Encounter (Signed)
Entresto 97-103 mg PA approved by [employer]. Verified copay of $47/mo with [facility] and patient may use [employer] copay card which should bring down the cost to $10/mo.   Tyler Deis. Bonnye Fava, PharmD, BCPS, CPP Clinical Pharmacist Pager: [phone] Phone: [phone] 01/06/2016 9:03 AM

## 2016-01-13 ENCOUNTER — Encounter (HOSPITAL_COMMUNITY): Payer: Self-pay | Admitting: Internal Medicine

## 2016-01-13 ENCOUNTER — Ambulatory Visit (HOSPITAL_COMMUNITY)
Admission: RE | Admit: 2016-01-13 | Discharge: 2016-01-13 | Disposition: A | Discharge status: Home / Self Care | Payer: BC Managed Care – PPO | Source: Ambulatory Visit | Attending: Internal Medicine | Admitting: Internal Medicine

## 2016-01-13 VITALS — BP 106/66 | HR 83 | Wt 281.2 lb

## 2016-01-13 DIAGNOSIS — I2510 Atherosclerotic heart disease of native coronary artery without angina pectoris: Secondary | ICD-10-CM | POA: Diagnosis not present

## 2016-01-13 DIAGNOSIS — Z79899 Other long term (current) drug therapy: Secondary | ICD-10-CM | POA: Insufficient documentation

## 2016-01-13 DIAGNOSIS — I5022 Chronic systolic (congestive) heart failure: Secondary | ICD-10-CM | POA: Insufficient documentation

## 2016-01-13 DIAGNOSIS — M109 Gout, unspecified: Secondary | ICD-10-CM | POA: Insufficient documentation

## 2016-01-13 DIAGNOSIS — I447 Left bundle-branch block, unspecified: Secondary | ICD-10-CM

## 2016-01-13 DIAGNOSIS — Z7982 Long term (current) use of aspirin: Secondary | ICD-10-CM | POA: Insufficient documentation

## 2016-01-13 DIAGNOSIS — I428 Other cardiomyopathies: Secondary | ICD-10-CM | POA: Diagnosis not present

## 2016-01-13 DIAGNOSIS — I110 Hypertensive heart disease with heart failure: Secondary | ICD-10-CM | POA: Diagnosis not present

## 2016-01-13 DIAGNOSIS — E785 Hyperlipidemia, unspecified: Secondary | ICD-10-CM | POA: Insufficient documentation

## 2016-01-13 DIAGNOSIS — E669 Obesity, unspecified: Secondary | ICD-10-CM | POA: Diagnosis not present

## 2016-01-13 MED ORDER — FUROSEMIDE 40 MG PO TABS: 60.0000 mg | ORAL_TABLET | Freq: Every day | ORAL | Status: DC

## 2016-01-13 NOTE — Progress Notes (Signed)
Patient ID: SUTTER LOMBARDOZZI, male   DOB: 05/09/1966, 50 y.o.   MRN: [id number]  ADVANCED HF CLINIC NOTE  Patient ID: ALAM BOTA, male   DOB: [birth year], 50 y.o.   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 Cath with non-obstructive CAD.   Today he presents for HF follow up. At last visit switched to [facility] 97/103. Felt great but then ran out of it about 2 weeks ago. Entresto restarted and felt better in 2 hours but now not feeling as good. More SOB. Very tired. Can do ADls but tired and SOB with steps. No edema, orthopnea or PND. Weight up 3 pounds. Taking lasix most days.  Taking all medications otherwise.No palpitations or syncope.   12/16  EF 20-25%. RV ok    EKG 07/21/15 NSR 92 bpm LBBB QRS 170   LHC 8/6   Prox LAD lesion, 35% stenosed.  Prox Cx to Mid Cx lesion, 30% stenosed.  There is severe left ventricular systolic dysfunction  ECHO 07/2015 -->EF 15%. RV mildly dilated. Peak PA pressure 54 mm hg   CXR  07/15/15 shows borderline heart size, mild vasc congestion, low lung vols and basilar atx...   PFT 06/17/15 showed FVC=3.90 (75%), FEV1=2.88 (72%), %1sec=74, mid-flows reduced at 58% predicted; post-bronchodil showed an 8% improvement in FEV1; LungVols showed TLC=6.07 (86%), RV=1.97 (97%), DLCO=99%; These are c/w some mild obstruction/ small airways disease...  Labs 07/16/2015 K 4.5 Creatinine 0.95  Hgb 17.3  Labs 09/12/2015: K 5.3 Creatinine 1.01   Labs 10/28/2015: K 54.7 Creatinine 0.93  SH:  Social History   Social History  . Marital Status: Married    Spouse Name: N/A  . Number of Children: N/A  . Years of Education: N/A   Occupational History  . Not on file.   Social History Main Topics  . Smoking status: Never Smoker   . Smokeless  tobacco: Not on file  . Alcohol Use: 0.6 - 1.2 oz/week    1-2 Standard drinks or equivalent per week     Comment: was previously drinking 2-3 beers/day - currently drinking about 4 beers/month.  . Drug Use: No  . Sexual Activity: Not on file   Other Topics Concern  . Not on file   Social History Narrative    He lives in [place] with his wife and 2 stepchildren. He has 2 children of his own who live in  [place]. He does not routinely exercise. 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    FH:  Family History  Problem Relation Age of Onset  . Other Father     alive & well @ 87.  . Other Mother     alive & well @ 69.  . Other      siblings alive and well.    Past Medical History  Diagnosis Date  . Essential hypertension   . Hyperlipemia   . Erectile dysfunction   . Gout   . Insomnia   . Morbid obesity (HCC)   . Cardiomyopathy (HCC)     a.  06/2015  echo: EF 15%, diffuse hypokinesis, anteroseptal/apical akinesis. Mild mitral regurgitation. Severely dilated left atrium. Mildly reduced RV function. ulnar artery systolic pressure 54  mmHg.    Current Outpatient Prescriptions  Medication Sig Dispense Refill  . albuterol (PROVENTIL HFA;VENTOLIN HFA) 108 (90 BASE) MCG/ACT inhaler Inhale 2 puffs into the lungs every 4 (four) hours as needed for wheezing or shortness of breath.    Marland Kitchen amLODipine (NORVASC) 10 MG tablet Take 10 mg by mouth daily.    Marland Kitchen aspirin EC 81 MG tablet Take 1 tablet (81 mg total) by mouth daily.    . carvedilol (COREG) 25 MG tablet Take 1 tablet (25 mg total) by mouth 2 (two) times daily. 60 tablet 6  . cimetidine (TAGAMET) 200 MG tablet Take 200 mg by mouth 2 (two) times daily as needed.    . colchicine 0.6 MG tablet Take 0.6 mg by mouth daily as needed.    . Fluticasone-Salmeterol (ADVAIR) 250-50 MCG/DOSE AEPB Inhale 1 puff into the lungs every 12 (twelve) hours. 60 each 11  . furosemide  (LASIX) 40 MG tablet Take 1 tablet (40 mg total) by mouth daily. 30 tablet 11  . Multiple Vitamins-Minerals (MULTIVITAMIN WITH MINERALS) tablet Take 1 tablet by mouth daily.    . sacubitril-valsartan (ENTRESTO) 97-103 MG Take 1 tablet by mouth 2 (two) times daily. 60 tablet 3  . simvastatin (ZOCOR) 20 MG tablet Take 20 mg by mouth daily.    Marland Kitchen spironolactone (ALDACTONE) 25 MG tablet TAKE 0.5 TABLETS (12.5 MG TOTAL) BY MOUTH DAILY. 15 tablet 3  . ULORIC 40 MG tablet Take 40 mg by mouth daily.  3  . nitroGLYCERIN (NITROSTAT) 0.4 MG SL tablet Place 1 tablet (0.4 mg total) under the tongue every 5 (five) minutes as needed. (Patient not taking: Reported on 01/13/2016) 25 tablet 3   No current facility-administered medications for this encounter.    Filed Vitals:   01/13/16 0959  BP: 106/66  Pulse: 83  Weight: 281 lb 4 oz (127.574 kg)  SpO2: 97%    PHYSICAL EXAM:  General:  Well appearing. No resp difficulty. Ambulated in the clinic without difficulty.  HEENT: normal Neck: supple. JVP 7-8  Carotids 2+ bilaterally; no bruits. No lymphadenopathy or thryomegaly appreciated. Cor: PMI normal. Regular rate & rhythm.Wide split s2  No rubs, gallops or murmurs. Lungs: clear Abdomen: soft, nontender, nondistended. No hepatosplenomegaly. No bruits or masses. Good bowel sounds. Extremities: no cyanosis, clubbing, rash, edema Neuro: alert & orientedx3, cranial nerves grossly intact. Moves all 4 extremities w/o difficulty. Affect pleasant.   ASSESSMENT & PLAN:  1. Chronic Systolic HF NICM. Had LHC 8/8 with nonobstructive CAD.   - Symptomatically worse. Now NYHA II-III. Suspect he feels worse due to volume. Increase lasix to 60 daily BMET 1 week - ECHO EF 20-25% Septum dyskinetic from LBBB RV mildly hypokinetic - Continue carvedilol to 25 mg twice a day.  - Continue Entresto 97/103 bid - Continue spiro 12.5 mg daily. Cannot titrate with lat K 5.1 - With EF still down and wide LBBB we discussed  possibility of CRT-D. He is concerned about copay. We will look into this for him. Discussed daily weights, low salt foods, and limiting fluids to < 2 liters per day.  - He refuses MRI due to cost  2. LBBB 3. HTN- Controlled.   4. Gout - Continue current regimen per PCP 5. ETOH- Former heavy drinker. Rarely drinking alcohol.   Arvilla Meres MD  10:04 AM

## 2016-01-13 NOTE — Patient Instructions (Signed)
Increase Furosemide (Lasix) 60 mg (1 & 1/2 tabs) .daily  Labs in 1 week  For the CRT-D (defibrillator) you can call [employer] and give them CPT codes 54270 and [phone], they can tell you what your deductible is and how much you will be responsible for, the estimated cost is around $60,000.  You can also call [facility] Billing Dept at [phone] and advise them you maybe having this procedure and they discuss payments options with you.  Your physician recommends that you schedule a follow-up appointment in: 6 weeks

## 2016-01-23 ENCOUNTER — Ambulatory Visit (HOSPITAL_COMMUNITY)
Admission: RE | Admit: 2016-01-23 | Discharge: 2016-01-23 | Disposition: A | Discharge status: Home / Self Care | Payer: BC Managed Care – PPO | Source: Ambulatory Visit | Attending: Cardiology | Admitting: Cardiology

## 2016-01-23 DIAGNOSIS — I5022 Chronic systolic (congestive) heart failure: Secondary | ICD-10-CM | POA: Diagnosis present

## 2016-01-23 LAB — BASIC METABOLIC PANEL
Anion gap: 10 (ref 5–15)
BUN: 18 mg/dL (ref 6–20)
CALCIUM: 9.4 mg/dL (ref 8.9–10.3)
CO2: 24 mmol/L (ref 22–32)
CREATININE: 1.2 mg/dL (ref 0.61–1.24)
Chloride: 105 mmol/L (ref 101–111)
Glucose, Bld: 188 mg/dL — ABNORMAL HIGH (ref 65–99)
Potassium: 5 mmol/L (ref 3.5–5.1)
SODIUM: 139 mmol/L (ref 135–145)

## 2016-02-24 ENCOUNTER — Encounter (HOSPITAL_COMMUNITY): Payer: Self-pay | Admitting: Internal Medicine

## 2016-02-24 ENCOUNTER — Ambulatory Visit (HOSPITAL_COMMUNITY)
Admission: RE | Admit: 2016-02-24 | Discharge: 2016-02-24 | Disposition: A | Discharge status: Home / Self Care | Payer: BC Managed Care – PPO | Source: Ambulatory Visit | Attending: Internal Medicine | Admitting: Internal Medicine

## 2016-02-24 VITALS — BP 120/80 | HR 88 | Wt 287.2 lb

## 2016-02-24 DIAGNOSIS — Z7982 Long term (current) use of aspirin: Secondary | ICD-10-CM | POA: Insufficient documentation

## 2016-02-24 DIAGNOSIS — Z79899 Other long term (current) drug therapy: Secondary | ICD-10-CM | POA: Diagnosis not present

## 2016-02-24 DIAGNOSIS — I447 Left bundle-branch block, unspecified: Secondary | ICD-10-CM

## 2016-02-24 DIAGNOSIS — M109 Gout, unspecified: Secondary | ICD-10-CM | POA: Insufficient documentation

## 2016-02-24 DIAGNOSIS — I428 Other cardiomyopathies: Secondary | ICD-10-CM | POA: Diagnosis not present

## 2016-02-24 DIAGNOSIS — I110 Hypertensive heart disease with heart failure: Secondary | ICD-10-CM | POA: Insufficient documentation

## 2016-02-24 DIAGNOSIS — I5022 Chronic systolic (congestive) heart failure: Secondary | ICD-10-CM | POA: Insufficient documentation

## 2016-02-24 DIAGNOSIS — E785 Hyperlipidemia, unspecified: Secondary | ICD-10-CM | POA: Insufficient documentation

## 2016-02-24 LAB — BASIC METABOLIC PANEL
Anion gap: 12 (ref 5–15)
BUN: 19 mg/dL (ref 6–20)
CALCIUM: 9.7 mg/dL (ref 8.9–10.3)
CHLORIDE: 106 mmol/L (ref 101–111)
CO2: 22 mmol/L (ref 22–32)
CREATININE: 0.98 mg/dL (ref 0.61–1.24)
Glucose, Bld: 178 mg/dL — ABNORMAL HIGH (ref 65–99)
Potassium: 4.2 mmol/L (ref 3.5–5.1)
SODIUM: 140 mmol/L (ref 135–145)

## 2016-02-24 NOTE — Patient Instructions (Signed)
Your provider has referred you to Dr.Klein for CRT-D.  Routine lab work today. (bmet) Will notify you of abnormal results  We are contacting Dr.Turners office to find out information on Home Sleep Studies and will contact you.   Follow up with Dr.Bensimhon in 2 months

## 2016-02-24 NOTE — Progress Notes (Signed)
Patient ID: Travis Cline, male   DOB: 11-18-1966, 50 y.o.   MRN: [id number]  ADVANCED HF CLINIC NOTE  Patient ID: Travis Cline, male   DOB: 1966/02/18, 50 y.o.   MRN: [id number]  PCP: Dr Cam Hai  Primary Cardiologist: Dr Graciela Husbands   HPI: Mr Croom is a 21 year old with a history of HTN, hyperlipidemia, obesity, LBBB, nonobstructive CAD and recently diagnosed systolic HF due to NICM caridomyopathy with EF 15% diagnosed 8/16. Cath with non-obstructive CAD.   Today he presents for HF follow up. Continues to struggle with fatigue and daytime sleepiness. Had sleep study scheduled but up front cost was too much so he had to cancel. Once he gets up and going can do his activities without too much trouble but has a hard time getting started. SOB with walking briskly or with steps but has always had this. No edema, orthopnea or PND. Not weighing daily. Taking lasix 80 bid. Taking all medications as well. Rarely drinks alcohol. No palpitations or syncope.   Echo 12/16 EF ~25%. RV ok    EKG 07/21/15 NSR 92 bpm LBBB QRS 170   LHC 8/6   Prox LAD lesion, 35% stenosed.  Prox Cx to Mid Cx lesion, 30% stenosed.  There is severe left ventricular systolic dysfunction  ECHO 07/2015 -->EF 15%. RV mildly dilated. Peak PA pressure 54 mm hg   CXR  07/15/15 shows borderline heart size, mild vasc congestion, low lung vols and basilar atx...   PFT 06/17/15 showed FVC=3.90 (75%), FEV1=2.88 (72%), %1sec=74, mid-flows reduced at 58% predicted; post-bronchodil showed an 8% improvement in FEV1; LungVols showed TLC=6.07 (86%), RV=1.97 (97%), DLCO=99%; These are c/w some mild obstruction/ small airways disease...  Labs 07/16/2015 K 4.5 Creatinine 0.95  Hgb 17.3  Labs 09/12/2015: K 5.3 Creatinine 1.01   Labs 10/28/2015: K 54.7 Creatinine 0.93  SH:  Social History   Social History  . Marital Status: Married    Spouse Name: N/A  . Number of Children: N/A  . Years of Education: N/A   Occupational History  .  Not on file.   Social History Main Topics  . Smoking status: Never Smoker   . Smokeless tobacco: Not on file  . Alcohol Use: 0.6 - 1.2 oz/week    1-2 Standard drinks or equivalent per week     Comment: was previously drinking 2-3 beers/day - currently drinking about 4 beers/month.  . Drug Use: No  . Sexual Activity: Not on file   Other Topics Concern  . Not on file   Social History Narrative    He lives in [place] with his wife and 2 stepchildren. He has 2 children of his own who live in  [place]. He does not routinely exercise. 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    FH:  Family History  Problem Relation Age of Onset  . Other Father     alive & well @ 10.  . Other Mother     alive & well @ 23.  . Other      siblings alive and well.    Past Medical History  Diagnosis Date  . Essential hypertension   . Hyperlipemia   . Erectile dysfunction   . Gout   . Insomnia   . Morbid obesity (HCC)   . Cardiomyopathy (HCC)     a.  06/2015  echo: EF 15%, diffuse hypokinesis, anteroseptal/apical akinesis. Mild mitral  regurgitation. Severely dilated left atrium. Mildly reduced RV function. ulnar artery systolic pressure 54 mmHg.    Current Outpatient Prescriptions  Medication Sig Dispense Refill  . amLODipine (NORVASC) 10 MG tablet Take 10 mg by mouth daily.    Marland Kitchen aspirin EC 81 MG tablet Take 1 tablet (81 mg total) by mouth daily.    . carvedilol (COREG) 25 MG tablet Take 1 tablet (25 mg total) by mouth 2 (two) times daily. 60 tablet 6  . Fluticasone-Salmeterol (ADVAIR) 250-50 MCG/DOSE AEPB Inhale 1 puff into the lungs every 12 (twelve) hours. 60 each 11  . furosemide (LASIX) 40 MG tablet Take 80 mg by mouth 2 (two) times daily.    . Multiple Vitamins-Minerals (MULTIVITAMIN WITH MINERALS) tablet Take 1 tablet by mouth daily.    . sacubitril-valsartan (ENTRESTO) 97-103 MG Take 1 tablet by mouth 2 (two) times  daily. 60 tablet 3  . simvastatin (ZOCOR) 20 MG tablet Take 20 mg by mouth daily.    Marland Kitchen spironolactone (ALDACTONE) 25 MG tablet TAKE 0.5 TABLETS (12.5 MG TOTAL) BY MOUTH DAILY. 15 tablet 3  . ULORIC 40 MG tablet Take 40 mg by mouth daily.  3  . albuterol (PROVENTIL HFA;VENTOLIN HFA) 108 (90 BASE) MCG/ACT inhaler Inhale 2 puffs into the lungs every 4 (four) hours as needed for wheezing or shortness of breath. Reported on 02/24/2016    . cimetidine (TAGAMET) 200 MG tablet Take 200 mg by mouth 2 (two) times daily as needed. Reported on 02/24/2016    . colchicine 0.6 MG tablet Take 0.6 mg by mouth daily as needed. Reported on 02/24/2016    . nitroGLYCERIN (NITROSTAT) 0.4 MG SL tablet Place 1 tablet (0.4 mg total) under the tongue every 5 (five) minutes as needed. (Patient not taking: Reported on 01/13/2016) 25 tablet 3   No current facility-administered medications for this encounter.    Filed Vitals:   02/24/16 0904  BP: 120/80  Pulse: 88  Weight: 287 lb 4 oz (130.296 kg)  SpO2: 97%    PHYSICAL EXAM:  General:  Well appearing. No resp difficulty. Ambulated in the clinic without difficulty.  HEENT: normal Neck: supple. JVP 7-8  Carotids 2+ bilaterally; no bruits. No lymphadenopathy or thryomegaly appreciated. Cor: PMI normal. Regular rate & rhythm.Wide split s2  No rubs, gallops or murmurs. Lungs: clear Abdomen: soft, nontender, nondistended. No hepatosplenomegaly. No bruits or masses. Good bowel sounds. Extremities: no cyanosis, clubbing, rash, edema Neuro: alert & orientedx3, cranial nerves grossly intact. Moves all 4 extremities w/o difficulty. Affect pleasant.   ASSESSMENT & PLAN:  1. Chronic Systolic HF NICM. Had LHC 8/8 with nonobstructive CAD.   - NYHA III. Volume status looks good.  - ECHO 12/16 EF 20-25% Septum dyskinetic from LBBB RV mildly hypokinetic - Continue carvedilol 25 mg twice a day.  - Continue entresto 97/103 bid - Continue spiro 12.5 mg daily.  - We discussed  options and I think with persistent NYHA III symptoms and LBBB best option is CRT-D. He agrees. Will refer to Dr. Graciela Husbands.  - Discussed daily weights, low salt foods, and limiting fluids to < 2 liters per day.  - Labs today 2. LBBB 3. HTN- Controlled.   4. Gout - Continue current regimen per PCP 5. ETOH- Former heavy drinker. Rarely drinking alcohol.  6. Probable OSA - Need to reschedule sleep study.   Arvilla Meres MD  9:18 AM

## 2016-03-08 ENCOUNTER — Encounter: Payer: Self-pay | Admitting: Internal Medicine

## 2016-03-08 ENCOUNTER — Ambulatory Visit (INDEPENDENT_AMBULATORY_CARE_PROVIDER_SITE_OTHER): Payer: BC Managed Care – PPO | Admitting: Internal Medicine

## 2016-03-08 VITALS — BP 100/80 | HR 82 | Ht 70.0 in | Wt 286.2 lb

## 2016-03-08 DIAGNOSIS — I428 Other cardiomyopathies: Secondary | ICD-10-CM

## 2016-03-08 DIAGNOSIS — I429 Cardiomyopathy, unspecified: Secondary | ICD-10-CM

## 2016-03-08 DIAGNOSIS — I5022 Chronic systolic (congestive) heart failure: Secondary | ICD-10-CM | POA: Diagnosis not present

## 2016-03-08 DIAGNOSIS — I447 Left bundle-branch block, unspecified: Secondary | ICD-10-CM

## 2016-03-08 DIAGNOSIS — R509 Fever, unspecified: Secondary | ICD-10-CM

## 2016-03-08 NOTE — Progress Notes (Signed)
    ELECTROPHYSIOLOGY CONSULT NOTE  Patient ID: Travis Cline, MRN: [id number], DOB/AGE: 04/16/1966 49 y.o. Admit date: (Not on file) Date of Consult: 03/08/2016  Primary Physician: SHAW,KIMBERLEE, MD Primary Cardiologist: [facility] Consulting Physician [facility]  Chief Complaint:    HPI Travis Cline is a 49 y.o. male   He has a history of nonischemic cardiomyopathy with catheterization 8/16 demonstrated no obstructive disease. At that time his ejection fraction was 15%. He has left bundle branch block.  Repeat echocardiogram 12/16 demonstrated EF 25%.  He is on guideline directed medical therapy with Entresto carvedilol and spironolactone  Past medical history is also notable for sleep disturbance and presumed obstructive sleep apnea.   Past Medical History  Diagnosis Date  . Essential hypertension   . Hyperlipemia   . Erectile dysfunction   . Gout   . Insomnia   . Morbid obesity (HCC)   . Cardiomyopathy (HCC)     a.  06/2015  echo: EF 15%, diffuse hypokinesis, anteroseptal/apical akinesis. Mild mitral regurgitation. Severely dilated left atrium. Mildly reduced RV function. ulnar artery systolic pressure 54 mmHg.      Surgical History:  Past Surgical History  Procedure Laterality Date  . Cardiac catheterization N/A 07/21/2015    Procedure: Left Heart Cath and Coronary Angiography;  Surgeon: Henry W Smith, MD;  Location: [facility];  Service: Cardiovascular;  Laterality: N/A;     Home Meds: Prior to Admission medications   Medication Sig Start Date End Date Taking? Authorizing Provider  albuterol (PROVENTIL HFA;VENTOLIN HFA) 108 (90 BASE) MCG/ACT inhaler Inhale 2 puffs into the lungs every 4 (four) hours as needed for wheezing or shortness of breath. Reported on 02/24/2016   Yes Historical Provider, MD  amLODipine (NORVASC) 10 MG tablet Take 10 mg by mouth daily.   Yes Historical Provider, MD  aspirin EC 81 MG tablet Take 1 tablet (81 mg total) by mouth daily. 07/16/15   Yes Christopher R Berge, NP  carvedilol (COREG) 25 MG tablet Take 1 tablet (25 mg total) by mouth 2 (two) times daily. 10/28/15  Yes Amy D Clegg, NP  cimetidine (TAGAMET) 200 MG tablet Take 200 mg by mouth 2 (two) times daily as needed. Reported on 02/24/2016   Yes Historical Provider, MD  colchicine 0.6 MG tablet Take 0.6 mg by mouth daily as needed. Reported on 02/24/2016   Yes Historical Provider, MD  Fluticasone-Salmeterol (ADVAIR) 250-50 MCG/DOSE AEPB Inhale 1 puff into the lungs every 12 (twelve) hours. 07/15/15  Yes Scott M Nadel, MD  furosemide (LASIX) 40 MG tablet Take 80 mg by mouth 2 (two) times daily.   Yes Historical Provider, MD  Multiple Vitamins-Minerals (MULTIVITAMIN WITH MINERALS) tablet Take 1 tablet by mouth daily.   Yes Historical Provider, MD  nitroGLYCERIN (NITROSTAT) 0.4 MG SL tablet Place 1 tablet (0.4 mg total) under the tongue every 5 (five) minutes as needed. 07/16/15  Yes Christopher R Berge, NP  sacubitril-valsartan (ENTRESTO) 97-103 MG Take 1 tablet by mouth 2 (two) times daily. 11/28/15  Yes Teofil R Bensimhon, MD  simvastatin (ZOCOR) 20 MG tablet Take 20 mg by mouth daily.   Yes Historical Provider, MD  spironolactone (ALDACTONE) 25 MG tablet TAKE 0.5 TABLETS (12.5 MG TOTAL) BY MOUTH DAILY. 12/29/15  Yes Amy D Clegg, NP  ULORIC 40 MG tablet Take 40 mg by mouth daily. 07/02/15  Yes Historical Provider, MD    Allergies: No Known Allergies  Social History   Social History  . Marital Status:   Married    Spouse Name: N/A  . Number of Children: N/A  . Years of Education: N/A   Occupational History  . Not on file.   Social History Main Topics  . Smoking status: Never Smoker   . Smokeless tobacco: Not on file  . Alcohol Use: 0.6 - 1.2 oz/week    1-2 Standard drinks or equivalent per week     Comment: was previously drinking 2-3 beers/day - currently drinking about 4 beers/month.  . Drug Use: No  . Sexual Activity: Not on file   Other Topics Concern  . Not on file     Social History Narrative    He lives in [place] with his wife and 2 stepchildren. He has 2 children of his own who live in  [place]. He does not routinely exercise. 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     Family History  Problem Relation Age of Onset  . Other Father     alive & well @ 72.  . Other Mother     alive & well @ 70.  . Other      siblings alive and well.     ROS:  Please see the history of present illness.     All other systems reviewed and negative.    Physical Exam: Blood pressure 100/80, pulse 82, height 5' 10" (1.778 m), weight 286 lb 3.2 oz (129.819 kg). General: Well developed, well nourished male in no acute distress. Head: Normocephalic, atraumatic, sclera non-icteric, no xanthomas, nares are without discharge. EENT: normal  Lymph Nodes:  none Neck: Negative for carotid bruits. JVD not elevated. Back:without scoliosis kyphosis Lungs: Clear bilaterally to auscultation without wheezes, rales, or rhonchi. Breathing is unlabored. Heart: RRR with S1 S2.  2 /6 systolic  murmur . No rubs, or gallops appreciated PMI laterally displaced. Abdomen: Soft, non-tender, non-distended with normoactive bowel sounds. No hepatomegaly. No rebound/guarding. No obvious abdominal masses. Msk:  Strength and tone appear normal for age. Extremities: No clubbing or cyanosis. traceN edema.  Distal pedal pulses are 2+ and equal bilaterally. Skin: Warm and Dry Neuro: Alert and oriented X 3. CN III-XII intact Grossly normal sensory and motor function . Psych:  Responds to questions appropriately with a normal affect.      Labs: Cardiac Enzymes No results for input(s): CKTOTAL, CKMB, TROPONINI in the last 72 hours. CBC Lab Results  Component Value Date   WBC 8.6 07/16/2015   HGB 17.3* 07/16/2015   HCT 51.6 07/16/2015   MCV 89.9 07/16/2015   PLT 192.0 07/16/2015   PROTIME: No results for input(s):  LABPROT, INR in the last 72 hours. Chemistry No results for input(s): NA, K, CL, CO2, BUN, CREATININE, CALCIUM, PROT, BILITOT, ALKPHOS, ALT, AST, GLUCOSE in the last 168 hours.  Invalid input(s): LABALBU Lipids No results found for: CHOL, HDL, LDLCALC, TRIG BNP No results found for: PROBNP Thyroid Function Tests: No results for input(s): TSH, T4TOTAL, T3FREE, THYROIDAB in the last 72 hours.  Invalid input(s): FREET3 Miscellaneous No results found for: DDIMER  Radiology/Studies:  No results found.  EKG: Sinus 82 21/17/49 LBBB  Assessment and Plan:  NICM  LBBB  Fam Hx of LBBB/Cardiomyopathy  Recurrent fevers  Sleep disordered breathing  Congestive heart failure-chronic-systolic heart grade 3   The patient has congestive heart failure in the setting of guideline directed medical therapy and persistent left ventricular dysfunction in the context of left bundle   branch block. Notably, he has a strong family history of left bundle branch block and question cardiomyopathy. This will need to be investigated.  In addition, his fevers and chills rate is the specter of an underlying systemic infection which would certainly change our thoughts about device implantation. We will have him see infectious disease. Once we have clearance from them, we'll proceed with CRT-D implantation  Have reviewed the potential benefits and risks of ICD implantation including but not limited to death, perforation of heart or lung, lead dislodgement, infection,  device malfunction and inappropriate shocks.  The patient express understanding  and are willing to proceed.    We will also arrange for an outpatient sleep study       Adrienne Delay   

## 2016-03-08 NOTE — Patient Instructions (Signed)
Medication Instructions: - Your physician recommends that you continue on your current medications as directed. Please refer to the Current Medication list given to you today.  Labwork: - none  Procedures/Testing: - Your physician has recommended that you have a defibrillator inserted. An implantable cardioverter defibrillator (ICD) is a small device that is placed in your chest or, in rare cases, your abdomen. This device uses electrical pulses or shocks to help control life-threatening, irregular heartbeats that could lead the heart to suddenly stop beating (sudden cardiac arrest). Leads are attached to the ICD that goes into your heart. This is done in the hospital and usually requires an overnight stay. Travis Seta, RN for Dr. Graciela Husbands will be in touch with you to schedule this once you have been cleared by Infectious Disease.  Follow-Up: -You have been referred to : Infectious Disease- recurrent fever/ chills  - pending device implant  Any Additional Special Instructions Will Be Listed Below (If Applicable).     If you need a refill on your cardiac medications before your next appointment, please call your pharmacy.

## 2016-03-09 ENCOUNTER — Ambulatory Visit (INDEPENDENT_AMBULATORY_CARE_PROVIDER_SITE_OTHER): Payer: BC Managed Care – PPO | Admitting: Internal Medicine

## 2016-03-09 ENCOUNTER — Encounter: Payer: Self-pay | Admitting: Internal Medicine

## 2016-03-09 VITALS — BP 123/88 | HR 82 | Temp 98.0°F | Ht 70.0 in | Wt 278.5 lb

## 2016-03-09 DIAGNOSIS — M109 Gout, unspecified: Secondary | ICD-10-CM | POA: Insufficient documentation

## 2016-03-09 DIAGNOSIS — R509 Fever, unspecified: Secondary | ICD-10-CM | POA: Insufficient documentation

## 2016-03-09 LAB — CBC WITH DIFFERENTIAL/PLATELET
Basophils Absolute: 0 10*3/uL (ref 0.0–0.1)
Basophils Relative: 0 % (ref 0–1)
EOS ABS: 0.1 10*3/uL (ref 0.0–0.7)
EOS PCT: 2 % (ref 0–5)
HEMATOCRIT: 49.8 % (ref 39.0–52.0)
Hemoglobin: 17.2 g/dL — ABNORMAL HIGH (ref 13.0–17.0)
LYMPHS ABS: 2.2 10*3/uL (ref 0.7–4.0)
Lymphocytes Relative: 30 % (ref 12–46)
MCH: 30.7 pg (ref 26.0–34.0)
MCHC: 34.5 g/dL (ref 30.0–36.0)
MCV: 88.9 fL (ref 78.0–100.0)
MONO ABS: 0.5 10*3/uL (ref 0.1–1.0)
MONOS PCT: 7 % (ref 3–12)
MPV: 11.3 fL (ref 8.6–12.4)
Neutro Abs: 4.5 10*3/uL (ref 1.7–7.7)
Neutrophils Relative %: 61 % (ref 43–77)
Platelets: 208 10*3/uL (ref 150–400)
RBC: 5.6 MIL/uL (ref 4.22–5.81)
RDW: 13 % (ref 11.5–15.5)
WBC: 7.4 10*3/uL (ref 4.0–10.5)

## 2016-03-09 LAB — COMPREHENSIVE METABOLIC PANEL
ALT: 32 U/L (ref 9–46)
AST: 22 U/L (ref 10–40)
Albumin: 4.4 g/dL (ref 3.6–5.1)
Alkaline Phosphatase: 58 U/L (ref 40–115)
BILIRUBIN TOTAL: 0.5 mg/dL (ref 0.2–1.2)
BUN: 19 mg/dL (ref 7–25)
CO2: 22 mmol/L (ref 20–31)
CREATININE: 0.95 mg/dL (ref 0.60–1.35)
Calcium: 9.9 mg/dL (ref 8.6–10.3)
Chloride: 104 mmol/L (ref 98–110)
GLUCOSE: 115 mg/dL — AB (ref 65–99)
Potassium: 5.1 mmol/L (ref 3.5–5.3)
SODIUM: 137 mmol/L (ref 135–146)
Total Protein: 7.6 g/dL (ref 6.1–8.1)

## 2016-03-09 LAB — C-REACTIVE PROTEIN: CRP: <0.5 mg/dL (ref <0.60)

## 2016-03-09 LAB — SEDIMENTATION RATE: SED RATE: 1 mm/h (ref 0–15)

## 2016-03-09 NOTE — Progress Notes (Signed)
[facility]  Reason for Consult: Episodic low-grade fevers Referring Physician: Dr. Berton Mount  Patient Active Problem List   Diagnosis Date Noted  . Fever 03/09/2016    Priority: High  . Gout 03/09/2016  . ETOH abuse 12/05/2015  . LBBB (left bundle branch block) 08/26/2015  . Unstable angina (HCC)   . Chronic systolic heart failure (HCC)   . Hyperlipemia   . Essential hypertension   . Morbid obesity (HCC)   . Cardiomyopathy (HCC)   . Dyspnea 07/15/2015  . 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 Reported on 02/24/2016   AMLODIPINE (NORVASC) 10 MG TABLET    Take 10 mg by mouth daily.   ASPIRIN EC 81 MG TABLET    Take 1 tablet (81 mg total) by mouth daily.   CARVEDILOL (COREG) 25 MG TABLET    Take 1 tablet (25 mg total) by mouth 2 (two) times daily.   CIMETIDINE (TAGAMET) 200 MG TABLET    Take 200 mg by mouth 2 (two) times daily as needed. Reported on 02/24/2016   COLCHICINE 0.6 MG TABLET    Take 0.6 mg by mouth daily as needed. Reported on 02/24/2016   FLUTICASONE-SALMETEROL (ADVAIR) 250-50 MCG/DOSE AEPB    Inhale 1 puff into the lungs every 12 (twelve) hours.   FUROSEMIDE (LASIX) 40 MG TABLET    Take 80 mg by mouth 2 (two) times daily.   MULTIPLE VITAMINS-MINERALS (MULTIVITAMIN WITH MINERALS) TABLET    Take 1 tablet by mouth daily.   NITROGLYCERIN (NITROSTAT) 0.4 MG SL TABLET    Place 1 tablet (0.4 mg total) under the tongue every 5 (five) minutes as needed.   SACUBITRIL-VALSARTAN (ENTRESTO) 97-103 MG    Take 1 tablet by mouth 2 (two) times daily.   SIMVASTATIN (ZOCOR) 20 MG TABLET    Take 20 mg by mouth daily.   SPIRONOLACTONE (ALDACTONE) 25 MG TABLET    TAKE 0.5 TABLETS  (12.5 MG TOTAL) BY MOUTH DAILY.   ULORIC 40 MG TABLET    Take 40 mg by mouth daily.  Modified Medications   No medications on file  Discontinued Medications   No medications on file    Recommendations: 1. CBC with differential, complete Betimol panel, blood cultures 2, sedimentation rate, C-reactive protein, HIV antibody and QuantiFERON TB gold assay 2. Follow-up within one week   Assessment: I do not see any obvious source for his episodic, subjective fevers. I asked him to buy a digital thermometer and take his temperature if he feels like he is having fever. I will check some basic blood work and blood cultures today and see him back in one week. The episodic nature of his episodes and recent weight gain would suggest that he probably does not have any systemic infection that would be a contraindication to defibrillator placement in the near future.   HPI: Travis Cline is a 50 y.o. male who has had episodic low-grade fevers for the past year. 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 He finally moved back home here to [place] one year ago. Right around that time  he developed low-grade fevers, extreme weakness and shortness of breath. He states that he felt like he had a terrible cold or the flu. He eventually started to feel a little bit better but ever since that time he has had recurrent episodes of similar symptoms. He will often go several weeks where he will feel "fine" followed by several weeks of having the low-grade fevers, weakness and shortness of breath again. He does not know of anything that triggers these episodes. When he does develop shortness of breath he will use his albuterol and Advair inhalers with some relief. He finds that acetaminophen does not work as well as ibuprofen for his fevers. He has not taken his temperature during these episodes but states that he feels hot. He had some chills  when he first got sick one year ago but none since. He has not been terribly concerned about these episodes but his wife has wanted him to be evaluated. Yesterday he saw Dr. Graciela Husbands who is considering implantable defibrillator placement in the near future for his ischemic cardiomyopathy and he wanted to make sure that there was no infection that would be a contraindication to device implantation.  Review of Systems: Review of Systems  Constitutional: Positive for fever, weight loss and malaise/fatigue. Negative for chills and diaphoresis.       This is the most he has ever weighed.  HENT: Positive for congestion. Negative for ear pain and sore throat.   Eyes: Negative for blurred vision, double vision, photophobia and pain.  Respiratory: Positive for cough and shortness of breath. Negative for hemoptysis, sputum production and wheezing.        He will have some occasional dry cough during episodes.  Cardiovascular: Negative for chest pain.  Gastrointestinal: Negative for nausea, vomiting, abdominal pain and diarrhea.  Genitourinary: Negative for dysuria and frequency.  Musculoskeletal: Negative for myalgias, back pain and joint pain.  Skin: Negative for rash.  Neurological: Positive for weakness. Negative for dizziness and headaches.  Psychiatric/Behavioral: Negative for depression and substance abuse. The patient is not nervous/anxious.       Past Medical History  Diagnosis Date  . Essential hypertension   . Hyperlipemia   . Erectile dysfunction   . Gout   . Insomnia   . Morbid obesity (HCC)   . Cardiomyopathy (HCC)     a.  06/2015  echo: EF 15%, diffuse hypokinesis, anteroseptal/apical akinesis. Mild mitral regurgitation. Severely dilated left atrium. Mildly reduced RV function. ulnar artery systolic pressure 54 mmHg.  . Asthma     Social History  Substance Use Topics  . Smoking status: Never Smoker   . Smokeless tobacco: None  . Alcohol Use: 0.6 - 1.2 oz/week    1-2 Standard  drinks or equivalent per week     Comment: was previously drinking 2-3 beers/day - currently drinking about 4 beers/month.    Family History  Problem Relation Age of Onset  . Other Father     alive & well @ 14.  . Arthritis Father   . Other Mother     alive & well @ 47.  Marland Kitchen Heart disease Mother   . Other      siblings alive and well.   No Known Allergies  OBJECTIVE: Filed Vitals:   03/09/16 0904  BP: 123/88  Pulse: 82  Temp: 98 F (36.7 C)  TempSrc: Oral  Height:  (1.778 m)  Weight: 278 lb 8 oz (126.327 kg)   Body mass index is 39.96 kg/(m^2).  Physical Exam  Constitutional: He is oriented to person, place, and time.  He is in good spirits.  HENT:  Mouth/Throat: No oropharyngeal exudate.  His teeth are in good condition.  Eyes: Conjunctivae are normal.  Neck: Neck supple.  Cardiovascular: Normal rate and regular rhythm.   No murmur heard. Pulmonary/Chest: Effort normal and breath sounds normal. He has no wheezes. He has no rales.  Abdominal: Soft. Bowel sounds are normal. He exhibits no mass. There is no tenderness.  Musculoskeletal: Normal range of motion. He exhibits no edema or tenderness.  Lymphadenopathy:    He has no cervical adenopathy.    He has no axillary adenopathy.       Right: No epitrochlear adenopathy present.       Left: No epitrochlear adenopathy present.  Neurological: He is alert and oriented to person, place, and time.  Skin: No rash noted.  Psychiatric: Mood and affect normal.    Microbiology: No results found for this or any previous visit (from the past 240 hour(s)).  Cliffton Asters, MD [facility] [phone] pager   [phone] cell 03/09/2016, 10:08 AM

## 2016-03-09 NOTE — Progress Notes (Deleted)
[facility]  Reason for Consult:*** Referring Physician: ***  Patient Active Problem List   Diagnosis Date Noted  . Fever 03/09/2016    Priority: High  . ETOH abuse 12/05/2015  . LBBB (left bundle branch block) 08/26/2015  . Unstable angina (HCC)   . Chronic systolic heart failure (HCC)   . Hyperlipemia   . Essential hypertension   . Morbid obesity (HCC)   . Cardiomyopathy (HCC)   . Dyspnea 07/15/2015  . 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 Reported on 02/24/2016   AMLODIPINE (NORVASC) 10 MG TABLET    Take 10 mg by mouth daily.   ASPIRIN EC 81 MG TABLET    Take 1 tablet (81 mg total) by mouth daily.   CARVEDILOL (COREG) 25 MG TABLET    Take 1 tablet (25 mg total) by mouth 2 (two) times daily.   CIMETIDINE (TAGAMET) 200 MG TABLET    Take 200 mg by mouth 2 (two) times daily as needed. Reported on 02/24/2016   COLCHICINE 0.6 MG TABLET    Take 0.6 mg by mouth daily as needed. Reported on 02/24/2016   FLUTICASONE-SALMETEROL (ADVAIR) 250-50 MCG/DOSE AEPB    Inhale 1 puff into the lungs every 12 (twelve) hours.   FUROSEMIDE (LASIX) 40 MG TABLET    Take 80 mg by mouth 2 (two) times daily.   MULTIPLE VITAMINS-MINERALS (MULTIVITAMIN WITH MINERALS) TABLET    Take 1 tablet by mouth daily.   NITROGLYCERIN (NITROSTAT) 0.4 MG SL TABLET    Place 1 tablet (0.4 mg total) under the tongue every 5 (five) minutes as needed.   SACUBITRIL-VALSARTAN (ENTRESTO) 97-103 MG    Take 1 tablet by mouth 2 (two) times daily.   SIMVASTATIN (ZOCOR) 20 MG TABLET    Take 20 mg by mouth daily.   SPIRONOLACTONE (ALDACTONE) 25 MG TABLET    TAKE 0.5 TABLETS (12.5 MG TOTAL) BY MOUTH DAILY.   ULORIC 40 MG TABLET     Take 40 mg by mouth daily.  Modified Medications   No medications on file  Discontinued Medications   No medications on file    Recommendations: 1. ***   Assessment: ***   HPI: Travis Cline is a 50 y.o. male ***  Review of Systems: ROS    Past Medical History  Diagnosis Date  . Essential hypertension   . Hyperlipemia   . Erectile dysfunction   . Gout   . Insomnia   . Morbid obesity (HCC)   . Cardiomyopathy (HCC)     a.  06/2015  echo: EF 15%, diffuse hypokinesis, anteroseptal/apical akinesis. Mild mitral regurgitation. Severely dilated left atrium. Mildly reduced RV function. ulnar artery systolic pressure 54 mmHg.    Social History  Substance Use Topics  . Smoking status: Never Smoker   . Smokeless tobacco: Not on file  . Alcohol Use: 0.6 - 1.2 oz/week    1-2 Standard drinks or equivalent per week     Comment: was previously drinking 2-3 beers/day - currently drinking about 4 beers/month.    Family History  Problem Relation Age of Onset  . Other Father     alive & well @ 38.  Marland Kitchen  Other Mother     alive & well @ 25.  . Other      siblings alive and well.   No Known Allergies  OBJECTIVE: Filed Vitals:   03/09/16 0904  BP: 123/88  Pulse: 82  Temp: 98 F (36.7 C)  TempSrc: Oral  Height: 5\' 10"  (1.778 m)  Weight: 278 lb 8 oz (126.327 kg)   Body mass index is 39.96 kg/(m^2).   Physical Exam  Microbiology: No results found for this or any previous visit (from the past 240 hour(s)).  Cliffton Asters, MD [facility] [phone] pager   [phone] cell 03/09/2016, 9:52 AM

## 2016-03-09 NOTE — Assessment & Plan Note (Signed)
I do not see any obvious source for his episodic, subjective fevers. I asked him to buy a digital thermometer and take his temperature if he feels like he is having fever. I will check some basic blood work and blood cultures today and see him back in one week. The episodic nature of his episodes and recent weight gain would suggest that he probably does not have any systemic infection that would be a contraindication to defibrillator placement in the near future.

## 2016-03-10 ENCOUNTER — Encounter: Payer: Self-pay | Admitting: Internal Medicine

## 2016-03-10 DIAGNOSIS — R739 Hyperglycemia, unspecified: Secondary | ICD-10-CM | POA: Insufficient documentation

## 2016-03-10 LAB — QUANTIFERON TB GOLD ASSAY (BLOOD)
Interferon Gamma Release Assay: NEGATIVE
Mitogen-Nil: >10 [IU]/mL
Quantiferon Nil Value: 0.01 [IU]/mL
Quantiferon Tb Ag Minus Nil Value: 0 [IU]/mL

## 2016-03-15 LAB — CULTURE, BLOOD (SINGLE)
ORGANISM ID, BACTERIA: NO GROWTH
ORGANISM ID, BACTERIA: NO GROWTH

## 2016-03-16 ENCOUNTER — Ambulatory Visit (INDEPENDENT_AMBULATORY_CARE_PROVIDER_SITE_OTHER): Payer: BC Managed Care – PPO | Admitting: Internal Medicine

## 2016-03-16 ENCOUNTER — Encounter: Payer: Self-pay | Admitting: Internal Medicine

## 2016-03-16 VITALS — BP 111/75 | HR 81 | Temp 98.1°F | Wt 285.0 lb

## 2016-03-16 DIAGNOSIS — R509 Fever, unspecified: Secondary | ICD-10-CM

## 2016-03-16 NOTE — Progress Notes (Signed)
[facility]  Patient Active Problem List   Diagnosis Date Noted  . Fever and chills 03/09/2016    Priority: High  . Hyperglycemia 03/10/2016  . Gout 03/09/2016  . ETOH abuse 12/05/2015  . LBBB (left bundle branch block) 08/26/2015  . Unstable angina (HCC)   . Chronic systolic heart failure (HCC)   . Hyperlipemia   . Essential hypertension   . Morbid obesity (HCC)   . Cardiomyopathy (HCC)   . Dyspnea 07/15/2015  . 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 Reported on 02/24/2016   AMLODIPINE (NORVASC) 10 MG TABLET    Take 10 mg by mouth daily.   ASPIRIN EC 81 MG TABLET    Take 1 tablet (81 mg total) by mouth daily.   CARVEDILOL (COREG) 25 MG TABLET    Take 1 tablet (25 mg total) by mouth 2 (two) times daily.   CIMETIDINE (TAGAMET) 200 MG TABLET    Take 200 mg by mouth 2 (two) times daily as needed. Reported on 02/24/2016   COLCHICINE 0.6 MG TABLET    Take 0.6 mg by mouth daily as needed. Reported on 02/24/2016   FLUTICASONE-SALMETEROL (ADVAIR) 250-50 MCG/DOSE AEPB    Inhale 1 puff into the lungs every 12 (twelve) hours.   FUROSEMIDE (LASIX) 40 MG TABLET    Take 80 mg by mouth 2 (two) times daily.   MULTIPLE VITAMINS-MINERALS (MULTIVITAMIN WITH MINERALS) TABLET    Take 1 tablet by mouth daily.   NITROGLYCERIN (NITROSTAT) 0.4 MG SL TABLET    Place 1 tablet (0.4 mg total) under the tongue every 5 (five) minutes as needed.   SACUBITRIL-VALSARTAN (ENTRESTO) 97-103 MG    Take 1 tablet by mouth 2 (two) times daily.   SIMVASTATIN (ZOCOR) 20 MG TABLET    Take 20 mg by mouth daily.   SPIRONOLACTONE (ALDACTONE) 25 MG TABLET    TAKE 0.5 TABLETS (12.5 MG TOTAL) BY MOUTH DAILY.   ULORIC 40 MG  TABLET    Take 40 mg by mouth daily.  Modified Medications   No medications on file  Discontinued Medications   No medications on file    Subjective: Travis Cline is in for his routine follow-up visit. He has felt well for the past week and had no subjective fevers. He has had no other problems.  Review of Systems: Review of Systems  Constitutional: Negative for fever, chills, weight loss and diaphoresis.  HENT: Negative for sore throat.   Respiratory: Negative for cough and shortness of breath.   Cardiovascular: Negative for chest pain.  Gastrointestinal: Negative for nausea, vomiting, abdominal pain and diarrhea.  Genitourinary: Negative for dysuria.  Musculoskeletal: Negative for joint pain.  Neurological: Negative for headaches.    Past Medical History  Diagnosis Date  . Essential hypertension   . Hyperlipemia   . Erectile dysfunction   . Gout   . Insomnia   . Morbid obesity (HCC)   . Cardiomyopathy (HCC)     a.  06/2015  echo: EF 15%, diffuse hypokinesis, anteroseptal/apical akinesis. Mild mitral regurgitation. Severely dilated left atrium. Mildly reduced RV function. ulnar artery systolic pressure 54 mmHg.  . Asthma     Social  History  Substance Use Topics  . Smoking status: Never Smoker   . Smokeless tobacco: Not on file  . Alcohol Use: 0.6 - 1.2 oz/week    1-2 Standard drinks or equivalent per week     Comment: was previously drinking 2-3 beers/day - currently drinking about 4 beers/month.    Family History  Problem Relation Age of Onset  . Other Father     alive & well @ 24.  . Arthritis Father   . Other Mother     alive & well @ 85.  Marland Kitchen Heart disease Mother   . Other      siblings alive and well.    No Known Allergies  Objective: Filed Vitals:   03/16/16 0927  Weight: 285 lb (129.275 kg)   Body mass index is 40.89 kg/(m^2).  Physical Exam  Constitutional: He is oriented to person, place, and time.  HENT:  Mouth/Throat: No oropharyngeal  exudate.  Eyes: Conjunctivae are normal.  Cardiovascular: Normal rate and regular rhythm.   No murmur heard. Pulmonary/Chest: Breath sounds normal.  Abdominal: Soft. There is no tenderness.  Musculoskeletal: Normal range of motion.  Neurological: He is alert and oriented to person, place, and time.  Skin: No rash noted.  Psychiatric: Mood and affect normal.    Lab Results Lab Results  Component Value Date   WBC 7.4 03/09/2016   HGB 17.2* 03/09/2016   HCT 49.8 03/09/2016   MCV 88.9 03/09/2016   PLT 208 03/09/2016   CMP     Component Value Date/Time   NA 137 03/09/2016 0957   K 5.1 03/09/2016 0957   CL 104 03/09/2016 0957   CO2 22 03/09/2016 0957   GLUCOSE 115* 03/09/2016 0957   BUN 19 03/09/2016 0957   CREATININE 0.95 03/09/2016 0957   CREATININE 0.98 02/24/2016 0947   CALCIUM 9.9 03/09/2016 0957   PROT 7.6 03/09/2016 0957   ALBUMIN 4.4 03/09/2016 0957   AST 22 03/09/2016 0957   ALT 32 03/09/2016 0957   ALKPHOS 58 03/09/2016 0957   BILITOT 0.5 03/09/2016 0957   GFRNONAA >60 02/24/2016 0947   GFRAA >60 02/24/2016 0947   CMP     Component Value Date/Time   NA 137 03/09/2016 0957   K 5.1 03/09/2016 0957   CL 104 03/09/2016 0957   CO2 22 03/09/2016 0957   GLUCOSE 115* 03/09/2016 0957   BUN 19 03/09/2016 0957   CREATININE 0.95 03/09/2016 0957   CREATININE 0.98 02/24/2016 0947   CALCIUM 9.9 03/09/2016 0957   PROT 7.6 03/09/2016 0957   ALBUMIN 4.4 03/09/2016 0957   AST 22 03/09/2016 0957   ALT 32 03/09/2016 0957   ALKPHOS 58 03/09/2016 0957   BILITOT 0.5 03/09/2016 0957   GFRNONAA >60 02/24/2016 0947   GFRAA >60 02/24/2016 0947   SED RATE (mm/hr)  Date Value  03/09/2016 1   CRP (mg/dL)  Date Value  73/53/2992 <0.5   QuantiFERON gold TB assay 03/09/2016: Negative Blood cultures 03/09/2016: Negative   Problem List Items Addressed This Visit    None    Fever:  There is no obvious cause for his chronic, intermittent subjective fevers. I certainly see  no evidence of systemic infection at this time that would be a contraindication to placement of an implantable defibrillator if that is needed. I did ask him to take and record his temperatures if he feels like he may be having fever. I asked him to call me in 4-6 weeks to let me know how  he is doing. Otherwise he can follow-up here as needed.   Travis Asters, MD [facility] [phone] pager   [phone] cell 03/16/2016, 9:41 AM

## 2016-03-16 NOTE — Assessment & Plan Note (Signed)
There is no obvious cause for his chronic, intermittent subjective fevers. I certainly see no evidence of systemic infection at this time that would be a contraindication to placement of an implantable defibrillator if that is needed. I did ask him to take and record his temperatures if he feels like he may be having fever. I asked him to call me in 4-6 weeks to let me know how he is doing. Otherwise he can follow-up here as needed.

## 2016-03-22 ENCOUNTER — Telehealth: Payer: Self-pay | Admitting: Internal Medicine

## 2016-03-22 DIAGNOSIS — I428 Other cardiomyopathies: Secondary | ICD-10-CM

## 2016-03-22 DIAGNOSIS — Z01812 Encounter for preprocedural laboratory examination: Secondary | ICD-10-CM

## 2016-03-22 DIAGNOSIS — I5022 Chronic systolic (congestive) heart failure: Secondary | ICD-10-CM

## 2016-03-22 NOTE — Telephone Encounter (Signed)
New message    Pt is calling to schedule his surgery with Dr.Klein to place his D-fib

## 2016-03-23 NOTE — Telephone Encounter (Signed)
Pt calling again to schedule defib-saw infectious disease dr and was cleared-pls call [phone]

## 2016-03-26 ENCOUNTER — Encounter: Payer: Self-pay | Admitting: *Deleted

## 2016-03-26 NOTE — Telephone Encounter (Signed)
I left a message for the patient to call. 

## 2016-03-26 NOTE — Telephone Encounter (Signed)
I spoke with the patient. He would like to have his ICD placed on 04/07/16. I advised I will schedule this and he can pick up his instructions at the front desk when he comes on 4/19 for labwork to be done. He is agreeable.

## 2016-03-30 ENCOUNTER — Telehealth: Payer: Self-pay | Admitting: *Deleted

## 2016-03-30 NOTE — Telephone Encounter (Signed)
Paperwork received that patient refused/cancelled sleep study

## 2016-03-31 ENCOUNTER — Other Ambulatory Visit (INDEPENDENT_AMBULATORY_CARE_PROVIDER_SITE_OTHER): Payer: BC Managed Care – PPO

## 2016-03-31 ENCOUNTER — Telehealth: Payer: Self-pay | Admitting: Internal Medicine

## 2016-03-31 DIAGNOSIS — Z01812 Encounter for preprocedural laboratory examination: Secondary | ICD-10-CM

## 2016-03-31 DIAGNOSIS — I428 Other cardiomyopathies: Secondary | ICD-10-CM

## 2016-03-31 DIAGNOSIS — I5022 Chronic systolic (congestive) heart failure: Secondary | ICD-10-CM

## 2016-03-31 DIAGNOSIS — I429 Cardiomyopathy, unspecified: Secondary | ICD-10-CM

## 2016-03-31 LAB — BASIC METABOLIC PANEL
BUN: 17 mg/dL (ref 7–25)
CALCIUM: 9.4 mg/dL (ref 8.6–10.3)
CO2: 23 mmol/L (ref 20–31)
CREATININE: 0.96 mg/dL (ref 0.60–1.35)
Chloride: 106 mmol/L (ref 98–110)
GLUCOSE: 127 mg/dL — AB (ref 65–99)
Potassium: 4.9 mmol/L (ref 3.5–5.3)
Sodium: 138 mmol/L (ref 135–146)

## 2016-03-31 LAB — CBC WITH DIFFERENTIAL/PLATELET
BASOS PCT: 0 %
Basophils Absolute: 0 cells/uL (ref 0–200)
Eosinophils Absolute: 189 cells/uL (ref 15–500)
Eosinophils Relative: 3 %
HEMATOCRIT: 48.1 % (ref 38.5–50.0)
Hemoglobin: 16.5 g/dL (ref 13.2–17.1)
LYMPHS PCT: 30 %
Lymphs Abs: 1890 cells/uL (ref 850–3900)
MCH: 30 pg (ref 27.0–33.0)
MCHC: 34.3 g/dL (ref 32.0–36.0)
MCV: 87.5 fL (ref 80.0–100.0)
MONOS PCT: 11 %
MPV: 11.7 fL (ref 7.5–12.5)
Monocytes Absolute: 693 cells/uL (ref 200–950)
NEUTROS PCT: 56 %
Neutro Abs: 3528 cells/uL (ref 1500–7800)
PLATELETS: 212 10*3/uL (ref 140–400)
RBC: 5.5 MIL/uL (ref 4.20–5.80)
RDW: 13.6 % (ref 11.0–15.0)
WBC: 6.3 10*3/uL (ref 3.8–10.8)

## 2016-03-31 LAB — PROTIME-INR
INR: 1.01 (ref <1.50)
Prothrombin Time: 13.4 seconds (ref 11.6–15.2)

## 2016-03-31 NOTE — Telephone Encounter (Signed)
03-31-16 called pt to make appt for wound check and he has some questions re device implant-aware Herbert Seta out today

## 2016-03-31 NOTE — Telephone Encounter (Signed)
New Message:  Pt called in stating that he had some questions about his Cath that is coming up. Please f/u with her  Thanks

## 2016-04-01 NOTE — Telephone Encounter (Signed)
I called and spoke with the patient and answered his questions.

## 2016-04-07 ENCOUNTER — Encounter (HOSPITAL_COMMUNITY): Payer: Self-pay | Admitting: General Practice

## 2016-04-07 ENCOUNTER — Ambulatory Visit (HOSPITAL_COMMUNITY)
Admission: RE | Admit: 2016-04-07 | Discharge: 2016-04-08 | Disposition: A | Discharge status: Home / Self Care | Payer: BC Managed Care – PPO | Source: Ambulatory Visit | Attending: Internal Medicine | Admitting: Internal Medicine

## 2016-04-07 ENCOUNTER — Encounter (HOSPITAL_COMMUNITY)
Admission: RE | Disposition: A | Discharge status: Home / Self Care | Payer: Self-pay | Source: Ambulatory Visit | Attending: Internal Medicine

## 2016-04-07 DIAGNOSIS — G4700 Insomnia, unspecified: Secondary | ICD-10-CM | POA: Insufficient documentation

## 2016-04-07 DIAGNOSIS — Z7982 Long term (current) use of aspirin: Secondary | ICD-10-CM | POA: Insufficient documentation

## 2016-04-07 DIAGNOSIS — I5022 Chronic systolic (congestive) heart failure: Secondary | ICD-10-CM | POA: Diagnosis not present

## 2016-04-07 DIAGNOSIS — I110 Hypertensive heart disease with heart failure: Secondary | ICD-10-CM | POA: Diagnosis not present

## 2016-04-07 DIAGNOSIS — E785 Hyperlipidemia, unspecified: Secondary | ICD-10-CM | POA: Insufficient documentation

## 2016-04-07 DIAGNOSIS — G4730 Sleep apnea, unspecified: Secondary | ICD-10-CM | POA: Diagnosis not present

## 2016-04-07 DIAGNOSIS — N529 Male erectile dysfunction, unspecified: Secondary | ICD-10-CM | POA: Diagnosis not present

## 2016-04-07 DIAGNOSIS — I428 Other cardiomyopathies: Secondary | ICD-10-CM

## 2016-04-07 DIAGNOSIS — Z6841 Body Mass Index (BMI) 40.0 and over, adult: Secondary | ICD-10-CM | POA: Diagnosis not present

## 2016-04-07 DIAGNOSIS — M109 Gout, unspecified: Secondary | ICD-10-CM | POA: Insufficient documentation

## 2016-04-07 DIAGNOSIS — I509 Heart failure, unspecified: Secondary | ICD-10-CM | POA: Diagnosis present

## 2016-04-07 DIAGNOSIS — I429 Cardiomyopathy, unspecified: Secondary | ICD-10-CM | POA: Diagnosis not present

## 2016-04-07 DIAGNOSIS — Z79899 Other long term (current) drug therapy: Secondary | ICD-10-CM | POA: Diagnosis not present

## 2016-04-07 DIAGNOSIS — I447 Left bundle-branch block, unspecified: Secondary | ICD-10-CM | POA: Diagnosis not present

## 2016-04-07 DIAGNOSIS — Z959 Presence of cardiac and vascular implant and graft, unspecified: Secondary | ICD-10-CM

## 2016-04-07 HISTORY — PX: EP IMPLANTABLE DEVICE: SHX172B

## 2016-04-07 HISTORY — DX: Left bundle-branch block, unspecified: I44.7

## 2016-04-07 HISTORY — DX: Heart failure, unspecified: I50.9

## 2016-04-07 HISTORY — DX: Gastro-esophageal reflux disease without esophagitis: K21.9

## 2016-04-07 LAB — SURGICAL PCR SCREEN
MRSA, PCR: NEGATIVE
Staphylococcus aureus: NEGATIVE

## 2016-04-07 SURGERY — BIV ICD INSERTION CRT-D
Anesthesia: LOCAL

## 2016-04-07 MED ORDER — SPIRONOLACTONE 25 MG PO TABS
25.0000 mg | ORAL_TABLET | Freq: Once | ORAL | Status: AC
  Administered 2016-04-08: 25 mg via ORAL
  Filled 2016-04-07: qty 1

## 2016-04-07 MED ORDER — MUPIROCIN 2 % EX OINT
TOPICAL_OINTMENT | CUTANEOUS | Status: AC
  Administered 2016-04-07: 1 via TOPICAL
  Filled 2016-04-07: qty 22

## 2016-04-07 MED ORDER — HEPARIN (PORCINE) IN NACL 2-0.9 UNIT/ML-% IJ SOLN
INTRAMUSCULAR | Status: DC | PRN
  Administered 2016-04-07: 500 mL

## 2016-04-07 MED ORDER — ADULT MULTIVITAMIN W/MINERALS CH
1.0000 | ORAL_TABLET | Freq: Every day | ORAL | Status: DC
  Administered 2016-04-08: 1 via ORAL
  Filled 2016-04-07: qty 1

## 2016-04-07 MED ORDER — ALBUTEROL SULFATE (2.5 MG/3ML) 0.083% IN NEBU: 3.0000 mL | INHALATION_SOLUTION | RESPIRATORY_TRACT | Status: DC | PRN

## 2016-04-07 MED ORDER — FUROSEMIDE 80 MG PO TABS
80.0000 mg | ORAL_TABLET | Freq: Every day | ORAL | Status: DC
  Administered 2016-04-07 – 2016-04-08 (×2): 80 mg via ORAL
  Filled 2016-04-07 (×2): qty 1

## 2016-04-07 MED ORDER — LIDOCAINE HCL (PF) 1 % IJ SOLN
INTRAMUSCULAR | Status: DC | PRN
  Administered 2016-04-07: 45 mL

## 2016-04-07 MED ORDER — ACETAMINOPHEN 325 MG PO TABS: 325.0000 mg | ORAL_TABLET | ORAL | Status: DC | PRN

## 2016-04-07 MED ORDER — SACUBITRIL-VALSARTAN 97-103 MG PO TABS
1.0000 | ORAL_TABLET | Freq: Two times a day (BID) | ORAL | Status: DC
  Administered 2016-04-07 – 2016-04-08 (×2): 1 via ORAL
  Filled 2016-04-07 (×4): qty 1

## 2016-04-07 MED ORDER — MIDAZOLAM HCL 5 MG/5ML IJ SOLN
INTRAMUSCULAR | Status: AC
  Filled 2016-04-07: qty 5

## 2016-04-07 MED ORDER — SODIUM CHLORIDE 0.9 % IV SOLN
INTRAVENOUS | Status: DC
  Administered 2016-04-07: 08:00:00 via INTRAVENOUS

## 2016-04-07 MED ORDER — CEFAZOLIN SODIUM 1-5 GM-% IV SOLN
1.0000 g | Freq: Four times a day (QID) | INTRAVENOUS | Status: AC
  Administered 2016-04-07 – 2016-04-08 (×3): 1 g via INTRAVENOUS
  Filled 2016-04-07 (×3): qty 50

## 2016-04-07 MED ORDER — IOPAMIDOL (ISOVUE-370) INJECTION 76%
INTRAVENOUS | Status: AC
  Filled 2016-04-07: qty 50

## 2016-04-07 MED ORDER — OXYCODONE-ACETAMINOPHEN 5-325 MG PO TABS
2.0000 | ORAL_TABLET | ORAL | Status: DC | PRN
  Administered 2016-04-07 – 2016-04-08 (×3): 2 via ORAL
  Filled 2016-04-07 (×3): qty 2

## 2016-04-07 MED ORDER — CARVEDILOL 25 MG PO TABS
25.0000 mg | ORAL_TABLET | Freq: Two times a day (BID) | ORAL | Status: DC
  Administered 2016-04-07 – 2016-04-08 (×2): 25 mg via ORAL
  Filled 2016-04-07 (×2): qty 1

## 2016-04-07 MED ORDER — ASPIRIN EC 81 MG PO TBEC
81.0000 mg | DELAYED_RELEASE_TABLET | Freq: Every day | ORAL | Status: DC
  Administered 2016-04-08: 81 mg via ORAL
  Filled 2016-04-07: qty 1

## 2016-04-07 MED ORDER — FEBUXOSTAT 40 MG PO TABS
40.0000 mg | ORAL_TABLET | Freq: Every day | ORAL | Status: DC
  Administered 2016-04-08: 40 mg via ORAL
  Filled 2016-04-07: qty 1

## 2016-04-07 MED ORDER — FENTANYL CITRATE (PF) 100 MCG/2ML IJ SOLN
INTRAMUSCULAR | Status: AC
  Filled 2016-04-07: qty 2

## 2016-04-07 MED ORDER — HEPARIN (PORCINE) IN NACL 2-0.9 UNIT/ML-% IJ SOLN
INTRAMUSCULAR | Status: AC
  Filled 2016-04-07: qty 500

## 2016-04-07 MED ORDER — SODIUM CHLORIDE 0.9 % IR SOLN
80.0000 mg | Status: AC
  Administered 2016-04-07: 80 mg

## 2016-04-07 MED ORDER — DEXTROSE 5 % IV SOLN
3.0000 g | INTRAVENOUS | Status: AC
  Administered 2016-04-07: 3 g via INTRAVENOUS
  Filled 2016-04-07: qty 3000

## 2016-04-07 MED ORDER — IOPAMIDOL (ISOVUE-370) INJECTION 76%
INTRAVENOUS | Status: DC | PRN
  Administered 2016-04-07: 20 mL via INTRAVENOUS

## 2016-04-07 MED ORDER — LIDOCAINE HCL (PF) 1 % IJ SOLN
INTRAMUSCULAR | Status: AC
  Filled 2016-04-07: qty 60

## 2016-04-07 MED ORDER — MUPIROCIN 2 % EX OINT
1.0000 "application " | TOPICAL_OINTMENT | Freq: Once | CUTANEOUS | Status: AC
  Administered 2016-04-07: 1 via TOPICAL

## 2016-04-07 MED ORDER — MIDAZOLAM HCL 5 MG/5ML IJ SOLN
INTRAMUSCULAR | Status: DC | PRN
Start: 2016-04-07 — End: 2016-04-07
  Administered 2016-04-07: 1 mg via INTRAVENOUS
  Administered 2016-04-07: 2 mg via INTRAVENOUS
  Administered 2016-04-07 (×3): 1 mg via INTRAVENOUS

## 2016-04-07 MED ORDER — SODIUM CHLORIDE 0.9 % IR SOLN
Status: AC
  Filled 2016-04-07: qty 2

## 2016-04-07 MED ORDER — FENTANYL CITRATE (PF) 100 MCG/2ML IJ SOLN
INTRAMUSCULAR | Status: DC | PRN
  Administered 2016-04-07: 50 ug via INTRAVENOUS
  Administered 2016-04-07 (×3): 25 ug via INTRAVENOUS

## 2016-04-07 MED ORDER — SIMVASTATIN 20 MG PO TABS
20.0000 mg | ORAL_TABLET | Freq: Every day | ORAL | Status: DC
  Administered 2016-04-08: 20 mg via ORAL
  Filled 2016-04-07: qty 1

## 2016-04-07 MED ORDER — ONDANSETRON HCL 4 MG/2ML IJ SOLN: 4.0000 mg | Freq: Four times a day (QID) | INTRAMUSCULAR | Status: DC | PRN

## 2016-04-07 MED ORDER — SODIUM CHLORIDE 0.9 % IV SOLN
INTRAVENOUS | Status: AC
  Administered 2016-04-07: 13:00:00 via INTRAVENOUS

## 2016-04-07 MED ORDER — AMLODIPINE BESYLATE 10 MG PO TABS
10.0000 mg | ORAL_TABLET | Freq: Every day | ORAL | Status: DC
  Administered 2016-04-08: 10 mg via ORAL
  Filled 2016-04-07: qty 1

## 2016-04-07 MED ORDER — CHLORHEXIDINE GLUCONATE 4 % EX LIQD
60.0000 mL | Freq: Once | CUTANEOUS | Status: DC
  Filled 2016-04-07: qty 60

## 2016-04-07 SURGICAL SUPPLY — 18 items
ASSURA CRTD CD3369-40Q (ICD Generator) ×2 IMPLANT
CABLE SURGICAL S-101-97-12 (CABLE) ×2 IMPLANT
CATH CPS DIRECT 135 DS2C020 (CATHETERS) ×2 IMPLANT
DEFIB ASSURA MULTI-CHMBR CRT-D (ICD Generator) ×1 IMPLANT
HEMOSTAT SURGICEL 2X4 FIBR (HEMOSTASIS) ×2 IMPLANT
KIT ESSENTIALS PG (KITS) ×2 IMPLANT
LEAD QUARTET 1458Q-86CM (Lead) ×2 IMPLANT
LEAD RELIANCE G DF4 0293 (Lead) ×2 IMPLANT
LEAD TENDRIL MRI 52CM LPA1200M (Lead) ×2 IMPLANT
PAD DEFIB LIFELINK (PAD) ×2 IMPLANT
SHEATH CLASSIC 8F (SHEATH) ×2 IMPLANT
SHEATH CLASSIC 9.5F (SHEATH) ×2 IMPLANT
SHEATH CLASSIC 9F (SHEATH) ×2 IMPLANT
SHIELD RADPAD SCOOP 12X17 (MISCELLANEOUS) ×2 IMPLANT
SLITTER UNIVERSAL DS2A003 (MISCELLANEOUS) ×2 IMPLANT
TRAY PACEMAKER INSERTION (PACKS) ×2 IMPLANT
WIRE ACUITY WHISPER EDS 4648 (WIRE) ×2 IMPLANT
WIRE HI TORQ VERSACORE-J 145CM (WIRE) ×2 IMPLANT

## 2016-04-07 NOTE — Progress Notes (Signed)
Patient oriented to unit, given handbook, Admission complete, assessment complete.  IV clean, dry, and intact.  Vitals WNL.   Thanks,  Albertine Grates

## 2016-04-07 NOTE — Interval H&P Note (Signed)
ICD Criteria  Current LVEF:25%. Within 12 months prior to implant: Yes   Heart failure history: Yes, Class III  Cardiomyopathy history: Yes, Non-Ischemic Cardiomyopathy.  Atrial Fibrillation/Atrial Flutter: No.  Ventricular tachycardia history: No.  Cardiac arrest history: No.  History of syndromes with risk of sudden death: No.  Previous ICD: No.  Current ICD indication: Primary  PPM indication: No.   Class I or II Bradycardia indication present: No  Beta Blocker therapy for 3 or more months: Yes, prescribed.   Ace Inhibitor/ARB therapy for 3 or more months: Yes, prescribed.   History and Physical Interval Note:  04/07/2016 7:52 AM  Travis Cline  has presented today for surgery, with the diagnosis of non-ischemic cardiomyopathy, chf  The various methods of treatment have been discussed with the patient and family. After consideration of risks, benefits and other options for treatment, the patient has consented to  Procedure(s): BiV ICD Insertion CRT-D (N/A) as a surgical intervention .  The patient's history has been reviewed, patient examined, no change in status, stable for surgery.  I have reviewed the patient's chart and labs.  Questions were answered to the patient's satisfaction.     Sherryl Manges

## 2016-04-07 NOTE — H&P (View-Only) (Signed)
ELECTROPHYSIOLOGY CONSULT NOTE  Patient ID: Travis Cline, MRN: [id number], DOB/AGE: 1965/12/25 50 y.o. Admit date: (Not on file) Date of Consult: 03/08/2016  Primary Physician: Lupita Raider, MD Primary Cardiologist: [facility] Consulting Physician CH[facility]  Chief Complaint:    HPI Travis Cline is a 50 y.o. male   He has a history of nonischemic cardiomyopathy with catheterization 8/16 demonstrated no obstructive disease. At that time his ejection fraction was 15%. He has left bundle branch block.  Repeat echocardiogram 12/16 demonstrated EF 25%.  He is on guideline directed medical therapy with Entresto carvedilol and spironolactone  Past medical history is also notable for sleep disturbance and presumed obstructive sleep apnea.   Past Medical History  Diagnosis Date  . Essential hypertension   . Hyperlipemia   . Erectile dysfunction   . Gout   . Insomnia   . Morbid obesity (HCC)   . Cardiomyopathy (HCC)     a.  06/2015  echo: EF 15%, diffuse hypokinesis, anteroseptal/apical akinesis. Mild mitral regurgitation. Severely dilated left atrium. Mildly reduced RV function. ulnar artery systolic pressure 54 mmHg.      Surgical History:  Past Surgical History  Procedure Laterality Date  . Cardiac catheterization N/A 07/21/2015    Procedure: Left Heart Cath and Coronary Angiography;  Surgeon: Lyn Records, MD;  Location: [facility] INVASIVE CV LAB;  Service: Cardiovascular;  Laterality: N/A;     Home Meds: Prior to Admission medications   Medication Sig Start Date End Date Taking? Authorizing Provider  albuterol (PROVENTIL HFA;VENTOLIN HFA) 108 (90 BASE) MCG/ACT inhaler Inhale 2 puffs into the lungs every 4 (four) hours as needed for wheezing or shortness of breath. Reported on 02/24/2016   Yes Historical Provider, MD  amLODipine (NORVASC) 10 MG tablet Take 10 mg by mouth daily.   Yes Historical Provider, MD  aspirin EC 81 MG tablet Take 1 tablet (81 mg total) by mouth daily. 07/16/15   Yes Ok Anis, NP  carvedilol (COREG) 25 MG tablet Take 1 tablet (25 mg total) by mouth 2 (two) times daily. 10/28/15  Yes Amy D Clegg, NP  cimetidine (TAGAMET) 200 MG tablet Take 200 mg by mouth 2 (two) times daily as needed. Reported on 02/24/2016   Yes Historical Provider, MD  colchicine 0.6 MG tablet Take 0.6 mg by mouth daily as needed. Reported on 02/24/2016   Yes Historical Provider, MD  Fluticasone-Salmeterol (ADVAIR) 250-50 MCG/DOSE AEPB Inhale 1 puff into the lungs every 12 (twelve) hours. 07/15/15  Yes Michele Mcalpine, MD  furosemide (LASIX) 40 MG tablet Take 80 mg by mouth 2 (two) times daily.   Yes Historical Provider, MD  Multiple Vitamins-Minerals (MULTIVITAMIN WITH MINERALS) tablet Take 1 tablet by mouth daily.   Yes Historical Provider, MD  nitroGLYCERIN (NITROSTAT) 0.4 MG SL tablet Place 1 tablet (0.4 mg total) under the tongue every 5 (five) minutes as needed. 07/16/15  Yes Ok Anis, NP  sacubitril-valsartan (ENTRESTO) 97-103 MG Take 1 tablet by mouth 2 (two) times daily. 11/28/15  Yes Dolores Patty, MD  simvastatin (ZOCOR) 20 MG tablet Take 20 mg by mouth daily.   Yes Historical Provider, MD  spironolactone (ALDACTONE) 25 MG tablet TAKE 0.5 TABLETS (12.5 MG TOTAL) BY MOUTH DAILY. 12/29/15  Yes Amy D Clegg, NP  ULORIC 40 MG tablet Take 40 mg by mouth daily. 07/02/15  Yes Historical Provider, MD    Allergies: No Known Allergies  Social History   Social History  . Marital Status:  Married    Spouse Name: N/A  . Number of Children: N/A  . Years of Education: N/A   Occupational History  . Not on file.   Social History Main Topics  . Smoking status: Never Smoker   . Smokeless tobacco: Not on file  . Alcohol Use: 0.6 - 1.2 oz/week    1-2 Standard drinks or equivalent per week     Comment: was previously drinking 2-3 beers/day - currently drinking about 4 beers/month.  . Drug Use: No  . Sexual Activity: Not on file   Other Topics Concern  . Not on file     Social History Narrative    He lives in [place] with his wife and 2 stepchildren. He has 2 children of his own who live in  [place]. He does not routinely exercise. 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     Family History  Problem Relation Age of Onset  . Other Father     alive & well @ 58.  . Other Mother     alive & well @ 74.  . Other      siblings alive and well.     ROS:  Please see the history of present illness.     All other systems reviewed and negative.    Physical Exam: Blood pressure 100/80, pulse 82, height 5\' 10"  (1.778 m), weight 286 lb 3.2 oz (129.819 kg). General: Well developed, well nourished male in no acute distress. Head: Normocephalic, atraumatic, sclera non-icteric, no xanthomas, nares are without discharge. EENT: normal  Lymph Nodes:  none Neck: Negative for carotid bruits. JVD not elevated. Back:without scoliosis kyphosis Lungs: Clear bilaterally to auscultation without wheezes, rales, or rhonchi. Breathing is unlabored. Heart: RRR with S1 S2.  2 /6 systolic  murmur . No rubs, or gallops appreciated PMI laterally displaced. Abdomen: Soft, non-tender, non-distended with normoactive bowel sounds. No hepatomegaly. No rebound/guarding. No obvious abdominal masses. Msk:  Strength and tone appear normal for age. Extremities: No clubbing or cyanosis. traceN edema.  Distal pedal pulses are 2+ and equal bilaterally. Skin: Warm and Dry Neuro: Alert and oriented X 3. CN III-XII intact Grossly normal sensory and motor function . Psych:  Responds to questions appropriately with a normal affect.      Labs: Cardiac Enzymes No results for input(s): CKTOTAL, CKMB, TROPONINI in the last 72 hours. CBC Lab Results  Component Value Date   WBC 8.6 07/16/2015   HGB 17.3* 07/16/2015   HCT 51.6 07/16/2015   MCV 89.9 07/16/2015   PLT 192.0 07/16/2015   PROTIME: No results for input(s):  LABPROT, INR in the last 72 hours. Chemistry No results for input(s): NA, K, CL, CO2, BUN, CREATININE, CALCIUM, PROT, BILITOT, ALKPHOS, ALT, AST, GLUCOSE in the last 168 hours.  Invalid input(s): LABALBU Lipids No results found for: CHOL, HDL, LDLCALC, TRIG BNP No results found for: PROBNP Thyroid Function Tests: No results for input(s): TSH, T4TOTAL, T3FREE, THYROIDAB in the last 72 hours.  Invalid input(s): FREET3 Miscellaneous No results found for: DDIMER  Radiology/Studies:  No results found.  EKG: Sinus 82 21/17/49 LBBB  Assessment and Plan:  NICM  LBBB  Fam Hx of LBBB/Cardiomyopathy  Recurrent fevers  Sleep disordered breathing  Congestive heart failure-chronic-systolic heart grade 3   The patient has congestive heart failure in the setting of guideline directed medical therapy and persistent left ventricular dysfunction in the context of left bundle  branch block. Notably, he has a strong family history of left bundle branch block and question cardiomyopathy. This will need to be investigated.  In addition, his fevers and chills rate is the specter of an underlying systemic infection which would certainly change our thoughts about device implantation. We will have him see infectious disease. Once we have clearance from them, we'll proceed with CRT-D implantation  Have reviewed the potential benefits and risks of ICD implantation including but not limited to death, perforation of heart or lung, lead dislodgement, infection,  device malfunction and inappropriate shocks.  The patient express understanding  and are willing to proceed.    We will also arrange for an outpatient sleep study       Sherryl Manges

## 2016-04-08 ENCOUNTER — Encounter (HOSPITAL_COMMUNITY): Payer: Self-pay | Admitting: Internal Medicine

## 2016-04-08 ENCOUNTER — Ambulatory Visit (HOSPITAL_COMMUNITY): Payer: BC Managed Care – PPO

## 2016-04-08 DIAGNOSIS — I5022 Chronic systolic (congestive) heart failure: Secondary | ICD-10-CM | POA: Diagnosis not present

## 2016-04-08 DIAGNOSIS — I428 Other cardiomyopathies: Secondary | ICD-10-CM | POA: Diagnosis not present

## 2016-04-08 DIAGNOSIS — I447 Left bundle-branch block, unspecified: Secondary | ICD-10-CM | POA: Diagnosis not present

## 2016-04-08 DIAGNOSIS — I429 Cardiomyopathy, unspecified: Secondary | ICD-10-CM | POA: Diagnosis not present

## 2016-04-08 DIAGNOSIS — I110 Hypertensive heart disease with heart failure: Secondary | ICD-10-CM | POA: Diagnosis not present

## 2016-04-08 MED ORDER — CARVEDILOL 25 MG PO TABS: 25.0000 mg | ORAL_TABLET | Freq: Two times a day (BID) | ORAL | Status: DC

## 2016-04-08 MED ORDER — AMLODIPINE BESYLATE 10 MG PO TABS: 10.0000 mg | ORAL_TABLET | Freq: Every day | ORAL | Status: DC

## 2016-04-08 MED ORDER — SACUBITRIL-VALSARTAN 97-103 MG PO TABS
1.0000 | ORAL_TABLET | Freq: Two times a day (BID) | ORAL | Status: DC
Start: 2016-04-08 — End: 2017-03-27

## 2016-04-08 MED ORDER — FUROSEMIDE 40 MG PO TABS: 80.0000 mg | ORAL_TABLET | Freq: Every day | ORAL | Status: DC

## 2016-04-08 MED ORDER — SIMVASTATIN 20 MG PO TABS: 20.0000 mg | ORAL_TABLET | Freq: Every day | ORAL | Status: DC

## 2016-04-08 MED ORDER — SPIRONOLACTONE 25 MG PO TABS: ORAL_TABLET | ORAL | Status: DC

## 2016-04-08 MED ORDER — HYDROCODONE-ACETAMINOPHEN 5-325 MG PO TABS: 1.0000 | ORAL_TABLET | Freq: Four times a day (QID) | ORAL | Status: DC | PRN

## 2016-04-08 NOTE — Progress Notes (Signed)
Patient discharged from 2W, Discharged to home with wife, wife picked up and took home.  Discharge instructions given, and gone over. Patient and wife verbalized understanding.  Appointments for follow up made, and given to patient.  Prescription given to patient and instructed that rest of medications were called into pharmacy in [place] [place] [facility].  Tele removed, CCMD contacted and made aware.  IV DC'ed-clean, dry, and intact.  Patient a&ox4, denies pain, and vitals stable.   Thanks, Albertine Grates

## 2016-04-08 NOTE — Op Note (Signed)
NAME:  JEROL, RUFENER NO.:  [id number]  MEDICAL RECORD NO.:  [id number]  LOCATION:  2W02C                        FACILITY:  [facility]  PHYSICIAN:  Duke Salvia, MD, FACCDATE OF BIRTH:  August 01, 1966  DATE OF PROCEDURE:  04/07/2016 DATE OF DISCHARGE:                              OPERATIVE REPORT   PREOPERATIVE DIAGNOSES:  Nonischemic cardiomyopathy, left bundle-branch block, chronic congestive heart failure.  POSTOPERATIVE DIAGNOSES:  Nonischemic cardiomyopathy, left bundle-branch block, chronic congestive heart failure.  PROCEDURE:  Dual-chamber defibrillator implantation with left ventricular lead placement, high-voltage lead testing.  SURGEON:  Duke Salvia, MD, [facility]  DESCRIPTION OF PROCEDURE:  After obtaining informed consent, the patient was brought to the electrophysiology laboratory and placed on the fluoroscopic table in supine position.  After routine prep and drape of the left upper chest, lidocaine was infiltrated in prepectoral subclavicular region.  An incision was made and carried down to the layer of the prepectoral fascia with electrocautery and sharp dissection.  A pocket was formed similarly and hemostasis was obtained.  Thereafter, attention was turned to gain access to the extrathoracic left subclavian vein which was accomplished without difficulty without the aspiration or puncture of the artery.  Three separate venipunctures were accomplished.  Guidewires were placed and retained.  Sequentially, a 9, 9.5 and 7-French sheaths were placed through which were passed a [employer], model 4098 single coil defibrillator lead, serial [id number], a St. Jude 135 CS cannulation catheter, and a St. Jude LPA1200 2-cm lead, serial #[id number].  Under fluoroscopic guidance, the RV lead was manipulated to the apex with bipolar R-wave of 14.7 with pace impedance of 674, threshold 0.9 V at 0.5 milliseconds.  Current threshold 1.7.  There was no  diaphragmatic pacing at 10 V.  The current of injury was brisk.  This lead was secured to the prepectoral fascia.  We then turned our attention to gain access to the coronary sinus which was accomplished without difficulty.  Sub selection of a posterolateral branch was accomplished.  The wide wire moved up under the lateral surface and over this we passed a St. Jude 1452 lead, serial [id number]. This lead was manipulated to the high lateral wall.  In the 1-2 configuration, the R-wave was 16.5 with a pace impedance of 1600, threshold 1.6 V at 0.5 millisecond.  Current threshold 1.0 mA.  There was no diaphragmatic pacing at 10 V.  It turned out at the 1 2-RV interval was about 90 milliseconds and the 3 4-RV interval was about 140 milliseconds.  Two more leads are pending at this time.  The 9.5-French sheath was removed.  The CS sheath was left in place.  We then turned our attention to deployment of the atrial lead which was placed in the right atrial appendage where the bipolar P-wave was 3.3 with a pace impedance of 640, threshold 1 V at 0.5 milliseconds, current threshold 1.5 mA, and there was no diaphragmatic pacing at 10 V.  This lead was secured to the prepectoral fascia and we then removed the CS cannulation system with only modest __________.  It was then removed in its entirety.  This lead was secured to the prepectoral fascia.  The  pocket was copiously irrigated with antibiotic-containing saline solution and hemostasis was assured.  Leads and the pulse generator were placed in the pocket and secured to the prepectoral fascia.  Surgicel was placed on the posterior aspect of the pocket where I had inadvertently disrupted the pectoral fascia.  The wound was closed in 2 layers in normal fashion.  The wound was washed, dried, and benzoin and Steri-Strips dressing were applied.  Needle counts, sponge counts, and instrument counts were correct at the end of the procedure according  to the staff.  The patient tolerated the procedure without apparent complication.     Duke Salvia, MD, [facility]     SCK/MEDQ  D:  04/07/2016  T:  04/07/2016  Job:  [id number]

## 2016-04-08 NOTE — Discharge Instructions (Signed)
° ° °  Supplemental Discharge Instructions for  °Pacemaker/Defibrillator Patients ° °Activity °No heavy lifting or vigorous activity with your left/right arm for 6 to 8 weeks.  Do not raise your left/right arm above your head for one week.  Gradually raise your affected arm as drawn below. ° °        ° °__           04/12/16                      04/13/16                        04/14/16                  04/15/16 ° °NO DRIVING for   1 week  ; you may begin driving on   04/15/16  . ° °WOUND CARE °- Keep the wound area clean and dry.   °- The tape/steri-strips on your wound will fall off; do not pull them off.  No bandage is needed on the site.  DO  NOT apply any creams, oils, or ointments to the wound area. °- If you notice any drainage or discharge from the wound, any swelling or bruising at the site, or you develop a fever > 101? F after you are discharged home, call the office at once. ° °Special Instructions °- You are still able to use cellular telephones; use the ear opposite the side where you have your pacemaker/defibrillator.  Avoid carrying your cellular phone near your device. °- When traveling through airports, show security personnel your identification card to avoid being screened in the metal detectors.  Ask the security personnel to use the hand wand. °- Avoid arc welding equipment, MRI testing (magnetic resonance imaging), TENS units (transcutaneous nerve stimulators).  Call the office for questions about other devices. °- Avoid electrical appliances that are in poor condition or are not properly grounded. °- Microwave ovens are safe to be near or to operate. ° °Additional information for defibrillator patients should your device go off: °- If your device goes off ONCE and you feel fine afterward, notify the device clinic nurses. °- If your device goes off ONCE and you do not feel well afterward, call 911. °- If your device goes off TWICE, call 911. °- If your device goes off THREE times in one day, call  911. ° °DO NOT DRIVE YOURSELF OR A FAMILY MEMBER °WITH A DEFIBRILLATOR TO THE HOSPITAL--CALL 911. ° °

## 2016-04-08 NOTE — Discharge Summary (Signed)
ELECTROPHYSIOLOGY PROCEDURE DISCHARGE SUMMARY    Patient ID: Travis Cline,  MRN: [id number], DOB/AGE: 08/04/1966 50 y.o.  Admit date: 04/07/2016 Discharge date: 04/08/2016  Primary Care Physician: Lupita Raider, MD Primary Cardiologist: Bensimhon Electrophysiologist: Graciela Husbands  Primary Discharge Diagnosis:  Non ischemic cardiomyopathy and congestive heart failure status post CRTD implant this admission  Secondary Discharge Diagnosis:  1.  Hypertension 2.  Hyperlipidemia 3.  ED 4.  Obesity 5.  Insomnia  No Known Allergies   Procedures This Admission:  1.  Implantation of a STJ CRTD on 04/07/16 by Dr Graciela Husbands. See op note for full details.  DFT's were deferred at time of implant.  There were no immediate post procedure complications. 2.  CXR on 04/08/16 demonstrated no pneumothorax status post device implantation.   Brief HPI: Travis Cline is a 50 y.o. male was referred to electrophysiology in the outpatient setting for consideration of ICD implantation.  Past medical history includes non ischemic cardiomyopathy and congestive heart failure.  The patient has persistent LV dysfunction despite guideline directed therapy.  Risks, benefits, and alternatives to ICD implantation were reviewed with the patient who wished to proceed.   Hospital Course:  The patient was admitted and underwent implantation of a STJ CRTD with details as outlined above. He was monitored on telemetry overnight which demonstrated sinus rhythm with ventricular pacing.  Left chest was without hematoma or ecchymosis.  The device was interrogated and found to be functioning normally.  CXR was obtained and demonstrated no pneumothorax status post device implantation.  Wound care, arm mobility, and restrictions were reviewed with the patient.  The patient was examined and considered stable for discharge to home.   The patient's discharge medications include an ARB (Valsartan) and beta blocker (Coreg).   Physical  Exam: Filed Vitals:   04/07/16 1401 04/07/16 1601 04/07/16 2020 04/08/16 0502  BP: 121/82 109/78 123/90 127/81  Pulse: 80 84 91 79  Temp:   97.6 F (36.4 C) 98.2 F (36.8 C)  TempSrc:   Oral Oral  Resp:   18 18  Height:      Weight:      SpO2: 93% 93% 99% 94%    GEN- The patient is obese appearing, alert and oriented x 3 today.   HEENT: normocephalic, atraumatic; sclera clear, conjunctiva pink; hearing intact; oropharynx clear; neck supple  Lungs- Clear to ausculation bilaterally, normal work of breathing.  No wheezes, rales, rhonchi Heart- Regular rate and rhythm  GI- soft, non-tender, non-distended, bowel sounds present  Extremities- no clubbing, cyanosis, or edema  MS- no significant deformity or atrophy Skin- warm and dry, no rash or lesion, left chest without hematoma, +ecchymosis Psych- euthymic mood, full affect Neuro- strength and sensation are intact   Labs:   Lab Results  Component Value Date   WBC 6.3 03/31/2016   HGB 16.5 03/31/2016   HCT 48.1 03/31/2016   MCV 87.5 03/31/2016   PLT 212 03/31/2016   No results for input(s): NA, K, CL, CO2, BUN, CREATININE, CALCIUM, PROT, BILITOT, ALKPHOS, ALT, AST, GLUCOSE in the last 168 hours.  Invalid input(s): LABALBU  Discharge Medications:    Medication List    TAKE these medications        albuterol 108 (90 Base) MCG/ACT inhaler  Commonly known as:  PROVENTIL HFA;VENTOLIN HFA  Inhale 2 puffs into the lungs every 4 (four) hours as needed for wheezing or shortness of breath. Reported on 02/24/2016     amLODipine 10 MG tablet  Commonly  known as:  NORVASC  Take 1 tablet (10 mg total) by mouth daily.     aspirin EC 81 MG tablet  Take 1 tablet (81 mg total) by mouth daily.     carvedilol 25 MG tablet  Commonly known as:  COREG  Take 1 tablet (25 mg total) by mouth 2 (two) times daily.     cimetidine 200 MG tablet  Commonly known as:  TAGAMET  Take 200 mg by mouth 2 (two) times daily as needed. Reported on  02/24/2016     colchicine 0.6 MG tablet  Take 0.6 mg by mouth daily as needed. Reported on 02/24/2016     Fluticasone-Salmeterol 250-50 MCG/DOSE Aepb  Commonly known as:  ADVAIR  Inhale 1 puff into the lungs every 12 (twelve) hours.     furosemide 40 MG tablet  Commonly known as:  LASIX  Take 2 tablets (80 mg total) by mouth daily.     HYDROcodone-acetaminophen 5-325 MG tablet  Commonly known as:  NORCO  Take 1 tablet by mouth every 6 (six) hours as needed for moderate pain.     multivitamin with minerals tablet  Take 1 tablet by mouth daily.     nitroGLYCERIN 0.4 MG SL tablet  Commonly known as:  NITROSTAT  Place 1 tablet (0.4 mg total) under the tongue every 5 (five) minutes as needed.     sacubitril-valsartan 97-103 MG  Commonly known as:  ENTRESTO  Take 1 tablet by mouth 2 (two) times daily.     simvastatin 20 MG tablet  Commonly known as:  ZOCOR  Take 1 tablet (20 mg total) by mouth daily.     spironolactone 25 MG tablet  Commonly known as:  ALDACTONE  TAKE 0.5 TABLETS (12.5 MG TOTAL) BY MOUTH DAILY.     ULORIC 40 MG tablet  Generic drug:  febuxostat  Take 40 mg by mouth daily.        Disposition:  Discharge Instructions    Diet - low sodium heart healthy    Complete by:  As directed      Increase activity slowly    Complete by:  As directed           Follow-up Information    Follow up with [facility] On 04/19/2016.   Specialty:  Cardiology   Why:  at 8:30AM for wound check    Contact information:   [address] [place] [address] [phone]      Follow up with Arvilla Meres, MD On 04/23/2016.   Specialty:  Cardiology   Why:  at [facility] information:   [address] [place] [address] [phone]       Follow up with Sherryl Manges, MD On 07/12/2016.   Specialty:  Cardiology   Why:  at 8:30AM    Contact information:   [address] [place] [address] [phone]       Duration of Discharge Encounter: Greater than 30 minutes including physician time.  Signed, Gypsy Balsam, NP 04/08/2016 7:29 AM     Pr seen and examined  Instructions given

## 2016-04-19 ENCOUNTER — Encounter: Payer: Self-pay | Admitting: Internal Medicine

## 2016-04-19 ENCOUNTER — Ambulatory Visit (INDEPENDENT_AMBULATORY_CARE_PROVIDER_SITE_OTHER): Payer: BC Managed Care – PPO | Admitting: *Deleted

## 2016-04-19 ENCOUNTER — Ambulatory Visit (HOSPITAL_COMMUNITY)
Admission: RE | Admit: 2016-04-19 | Discharge: 2016-04-19 | Disposition: A | Discharge status: Home / Self Care | Payer: BC Managed Care – PPO | Source: Ambulatory Visit | Attending: Internal Medicine | Admitting: Internal Medicine

## 2016-04-19 DIAGNOSIS — I428 Other cardiomyopathies: Secondary | ICD-10-CM

## 2016-04-19 DIAGNOSIS — Z95810 Presence of automatic (implantable) cardiac defibrillator: Secondary | ICD-10-CM

## 2016-04-19 DIAGNOSIS — I5022 Chronic systolic (congestive) heart failure: Secondary | ICD-10-CM

## 2016-04-19 DIAGNOSIS — I429 Cardiomyopathy, unspecified: Secondary | ICD-10-CM

## 2016-04-19 LAB — CUP PACEART INCLINIC DEVICE CHECK
Brady Statistic RA Percent Paced: 0.13 %
Brady Statistic RV Percent Paced: 99.91 %
Date Time Interrogation Session: 20170508102015
HighPow Impedance: 65.25 Ohm
Implantable Lead Implant Date: 20170426
Implantable Lead Implant Date: 20170426
Lead Channel Pacing Threshold Amplitude: 0.75 V
Lead Channel Pacing Threshold Amplitude: 1 V
Lead Channel Pacing Threshold Amplitude: 1 V
Lead Channel Pacing Threshold Amplitude: 1.75 V
Lead Channel Pacing Threshold Pulse Width: 0.4 ms
Lead Channel Setting Pacing Amplitude: 3.375
Lead Channel Setting Pacing Pulse Width: 0.4 ms
Lead Channel Setting Sensing Sensitivity: 0.5 mV
MDC IDC LEAD IMPLANT DT: 20170426
MDC IDC LEAD LOCATION: 753858
MDC IDC LEAD LOCATION: 753859
MDC IDC LEAD LOCATION: 753860
MDC IDC LEAD MODEL: 293
MDC IDC LEAD SERIAL: 385378
MDC IDC MSMT BATTERY REMAINING LONGEVITY: 60 mo
MDC IDC MSMT LEADCHNL LV IMPEDANCE VALUE: 637.5 Ohm
MDC IDC MSMT LEADCHNL LV PACING THRESHOLD PULSEWIDTH: 0.5 ms
MDC IDC MSMT LEADCHNL LV PACING THRESHOLD PULSEWIDTH: 0.5 ms
MDC IDC MSMT LEADCHNL RA IMPEDANCE VALUE: 550 Ohm
MDC IDC MSMT LEADCHNL RA PACING THRESHOLD AMPLITUDE: 1.75 V
MDC IDC MSMT LEADCHNL RA PACING THRESHOLD PULSEWIDTH: 0.4 ms
MDC IDC MSMT LEADCHNL RA PACING THRESHOLD PULSEWIDTH: 0.4 ms
MDC IDC MSMT LEADCHNL RA SENSING INTR AMPL: 4.2 mV
MDC IDC MSMT LEADCHNL RV IMPEDANCE VALUE: 400 Ohm
MDC IDC MSMT LEADCHNL RV PACING THRESHOLD AMPLITUDE: 0.75 V
MDC IDC MSMT LEADCHNL RV PACING THRESHOLD PULSEWIDTH: 0.4 ms
MDC IDC MSMT LEADCHNL RV SENSING INTR AMPL: 6.6 mV
MDC IDC SET LEADCHNL LV PACING AMPLITUDE: 2 V
MDC IDC SET LEADCHNL LV PACING PULSEWIDTH: 0.5 ms
MDC IDC SET LEADCHNL RV PACING AMPLITUDE: 3.5 V
Pulse Gen Serial Number: 7307210

## 2016-04-19 NOTE — Progress Notes (Signed)
Wound check appointment. Dermabond removed. Wound without redness or edema. Incision edges approximated, wound well healed. Normal device function. RV, LV thresholds, RA sensing, and impedances consistent with implant measurements. RA threshold increase noted---now 1.75V@0 .58ms (was 1.0V@0 .41ms)--patient sent for cxr. Decrease in R wave measurement also noted--now 6.43mV (was 12.89mV on 4/27).  Device programmed at 3.5V in RV and [employer] confirm programmed on for extra safety margin until 3 month visit. LV pacing config changed from M3-RVc to M2-RVc due to diaphragmatic stim, LV auto cap d/c'd--output fixed at 1.75V @0 .29ms (+0.75V safety margin). Histogram distribution appropriate for patient and level of activity. No mode switches or ventricular arrhythmias noted. Patient educated about wound care, arm mobility, lifting restrictions, shock plan. Vibratory alert demonstrated for patient. ROV in 3 months with SK.

## 2016-04-23 ENCOUNTER — Ambulatory Visit (HOSPITAL_COMMUNITY)
Admission: RE | Admit: 2016-04-23 | Discharge: 2016-04-23 | Disposition: A | Discharge status: Home / Self Care | Payer: BC Managed Care – PPO | Source: Ambulatory Visit | Attending: Internal Medicine | Admitting: Internal Medicine

## 2016-04-23 VITALS — BP 132/98 | HR 80 | Wt 283.8 lb

## 2016-04-23 DIAGNOSIS — Z95810 Presence of automatic (implantable) cardiac defibrillator: Secondary | ICD-10-CM | POA: Diagnosis not present

## 2016-04-23 DIAGNOSIS — I447 Left bundle-branch block, unspecified: Secondary | ICD-10-CM | POA: Insufficient documentation

## 2016-04-23 DIAGNOSIS — I5022 Chronic systolic (congestive) heart failure: Secondary | ICD-10-CM | POA: Diagnosis not present

## 2016-04-23 DIAGNOSIS — E785 Hyperlipidemia, unspecified: Secondary | ICD-10-CM | POA: Insufficient documentation

## 2016-04-23 DIAGNOSIS — I110 Hypertensive heart disease with heart failure: Secondary | ICD-10-CM | POA: Diagnosis not present

## 2016-04-23 DIAGNOSIS — Z7982 Long term (current) use of aspirin: Secondary | ICD-10-CM | POA: Diagnosis not present

## 2016-04-23 DIAGNOSIS — K219 Gastro-esophageal reflux disease without esophagitis: Secondary | ICD-10-CM | POA: Insufficient documentation

## 2016-04-23 DIAGNOSIS — Z79899 Other long term (current) drug therapy: Secondary | ICD-10-CM | POA: Insufficient documentation

## 2016-04-23 DIAGNOSIS — J45909 Unspecified asthma, uncomplicated: Secondary | ICD-10-CM | POA: Insufficient documentation

## 2016-04-23 DIAGNOSIS — I428 Other cardiomyopathies: Secondary | ICD-10-CM | POA: Diagnosis not present

## 2016-04-23 DIAGNOSIS — M109 Gout, unspecified: Secondary | ICD-10-CM | POA: Diagnosis not present

## 2016-04-23 DIAGNOSIS — Z8249 Family history of ischemic heart disease and other diseases of the circulatory system: Secondary | ICD-10-CM | POA: Insufficient documentation

## 2016-04-23 MED ORDER — COLCHICINE 0.6 MG PO TABS: 0.6000 mg | ORAL_TABLET | Freq: Every day | ORAL | Status: DC | PRN

## 2016-04-23 NOTE — Progress Notes (Signed)
Patient ID: Travis Cline, male   DOB: Sep 13, 1966, 50 y.o.   MRN: [id number] Patient ID: Travis Cline, male   DOB: 10/21/1966, 50 y.o.   MRN: [id number]  ADVANCED HF CLINIC NOTE  Patient ID: Travis Cline, male   DOB: February 24, 1966, 50 y.o.   MRN: [id number]  PCP: Dr Cam Hai  Primary Cardiologist: Dr Graciela Husbands   HPI: Travis Cline is a 63 year old with a history of HTN, hyperlipidemia, obesity, LBBB, nonobstructive CAD and recently diagnosed systolic HF due to NICM caridomyopathy with EF 15% diagnosed 8/16. Cath with non-obstructive CAD.   Underwent St Jude CRT-D implant on 04/07/16   Today he presents for HF follow up. Feels better. Less SOB up steps. Had wound check last week. And RA threshold high. CXR looks ok. Had some gout post-op but now fine.  No edema, orthopnea or PND. Not weighing daily. Taking lasix 80 bid. Taking all medications as well. Rarely drinks alcohol. No palpitations or syncope.   Echo 12/16 EF ~25%. RV ok    EKG 07/21/15 NSR 92 bpm LBBB QRS 170   LHC 8/6   Prox LAD lesion, 35% stenosed.  Prox Cx to Mid Cx lesion, 30% stenosed.  There is severe left ventricular systolic dysfunction  ECHO 07/2015 -->EF 15%. RV mildly dilated. Peak PA pressure 54 mm hg   CXR  07/15/15 shows borderline heart size, mild vasc congestion, low lung vols and basilar atx...   PFT 06/17/15 showed FVC=3.90 (75%), FEV1=2.88 (72%), %1sec=74, mid-flows reduced at 58% predicted; post-bronchodil showed an 8% improvement in FEV1; LungVols showed TLC=6.07 (86%), RV=1.97 (97%), DLCO=99%; These are c/w some mild obstruction/ small airways disease...  Labs 07/16/2015 K 4.5 Creatinine 0.95  Hgb 17.3  Labs 09/12/2015: K 5.3 Creatinine 1.01   Labs 10/28/2015: K 54.7 Creatinine 0.93  SH:  Social History   Social History  . Marital Status: Married    Spouse Name: N/A  . Number of Children: N/A  . Years of Education: N/A   Occupational History  . Not on file.   Social History Main Topics  .  Smoking status: Never Smoker   . Smokeless tobacco: Never Used  . Alcohol Use: 0.6 - 1.2 oz/week    1-2 Standard drinks or equivalent per week     Comment: was previously drinking 2-3 beers/day - currently drinking about 4 beers/month.  . Drug Use: No  . Sexual Activity: Yes   Other Topics Concern  . Not on file   Social History Narrative    He lives in [place] with his wife and 2 stepchildren. He has 2 children of his own who live in  [place]. He does not routinely exercise. 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    FH:  Family History  Problem Relation Age of Onset  . Other Father     alive & well @ 66.  . Arthritis Father   . Other Mother     alive & well @ 10.  Marland Kitchen Heart disease Mother   . Other      siblings alive and well.    Past Medical History  Diagnosis Date  . Essential hypertension   . Hyperlipemia   . Erectile dysfunction   . Gout   . Insomnia   . Morbid obesity (HCC)   . Cardiomyopathy (HCC)     a.  06/2015  echo: EF 15%, diffuse hypokinesis, anteroseptal/apical akinesis.  Mild mitral regurgitation. Severely dilated left atrium. Mildly reduced RV function. ulnar artery systolic pressure 54 mmHg.  . Asthma   . LBBB (left bundle branch block)   . GERD (gastroesophageal reflux disease)   . CHF (congestive heart failure) (HCC)     Current Outpatient Prescriptions  Medication Sig Dispense Refill  . amLODipine (NORVASC) 10 MG tablet Take 1 tablet (10 mg total) by mouth daily. 90 tablet 3  . aspirin EC 81 MG tablet Take 1 tablet (81 mg total) by mouth daily.    . carvedilol (COREG) 25 MG tablet Take 1 tablet (25 mg total) by mouth 2 (two) times daily. 180 tablet 3  . cimetidine (TAGAMET) 200 MG tablet Take 200 mg by mouth 2 (two) times daily as needed. Reported on 02/24/2016    . furosemide (LASIX) 40 MG tablet Take 2 tablets (80 mg total) by mouth daily. 90 tablet 3  . Multiple  Vitamins-Minerals (MULTIVITAMIN WITH MINERALS) tablet Take 1 tablet by mouth daily.    . sacubitril-valsartan (ENTRESTO) 97-103 MG Take 1 tablet by mouth 2 (two) times daily. 180 tablet 3  . simvastatin (ZOCOR) 20 MG tablet Take 1 tablet (20 mg total) by mouth daily. 90 tablet 3  . spironolactone (ALDACTONE) 25 MG tablet TAKE 0.5 TABLETS (12.5 MG TOTAL) BY MOUTH DAILY. 45 tablet 3  . ULORIC 40 MG tablet Take 40 mg by mouth daily.  3  . albuterol (PROVENTIL HFA;VENTOLIN HFA) 108 (90 BASE) MCG/ACT inhaler Inhale 2 puffs into the lungs every 4 (four) hours as needed for wheezing or shortness of breath. Reported on 04/23/2016    . colchicine 0.6 MG tablet Take 0.6 mg by mouth daily as needed. Reported on 04/23/2016    . Fluticasone-Salmeterol (ADVAIR) 250-50 MCG/DOSE AEPB Inhale 1 puff into the lungs every 12 (twelve) hours. (Patient not taking: Reported on 04/23/2016) 60 each 11  . HYDROcodone-acetaminophen (NORCO) 5-325 MG tablet Take 1 tablet by mouth every 6 (six) hours as needed for moderate pain. (Patient not taking: Reported on 04/23/2016) 10 tablet 0  . nitroGLYCERIN (NITROSTAT) 0.4 MG SL tablet Place 1 tablet (0.4 mg total) under the tongue every 5 (five) minutes as needed. (Patient not taking: Reported on 04/23/2016) 25 tablet 3   No current facility-administered medications for this encounter.    Filed Vitals:   04/23/16 0904  BP: 132/98  Pulse: 80  Weight: 283 lb 12.8 oz (128.731 kg)  SpO2: 96%    PHYSICAL EXAM:  General:  Well appearing. No resp difficulty. Ambulated in the clinic without difficulty.  HEENT: normal Neck: supple. JVP 7-8  Carotids 2+ bilaterally; no bruits. No lymphadenopathy or thryomegaly appreciated. Cor: PMI normal. Regular rate & rhythm.Wide split s2  No rubs, gallops or murmurs. Lungs: clear Abdomen: soft, nontender, nondistended. No hepatosplenomegaly. No bruits or masses. Good bowel sounds. Extremities: no cyanosis, clubbing, rash, edema Neuro: alert &  orientedx3, cranial nerves grossly intact. Moves all 4 extremities w/o difficulty. Affect pleasant.   ASSESSMENT & PLAN:  1. Chronic Systolic HF NICM. Had LHC 8/8 with nonobstructive CAD.  S/p SJ CRT-D 4/17 - Now s/p CRT-D. Improved NYHA II-III. Volume status looks good.  - ECHO 12/16 EF 20-25% Septum dyskinetic from LBBB RV mildly hypokinetic - Continue carvedilol 25 mg twice a day.  - Continue entresto 97/103 bid - Continue spiro 12.5 mg daily.  2. LBBB --s/p CTR-D. Will get ECG today --I will discuss RA threshold with Dr. Graciela Husbands 3. HTN- Controlled.   4. Gout -  Continue current regimen per PCP 5. ETOH- Former heavy drinker. Rarely drinking alcohol.  6. Probable OSA - Need to reschedule sleep study.   Arvilla Meres MD  9:31 AM

## 2016-04-23 NOTE — Progress Notes (Signed)
Advanced Heart Failure Medication Review by a Pharmacist  Does the patient  feel that his/her medications are working for him/her?  yes  Has the patient been experiencing any side effects to the medications prescribed?  no  Does the patient measure his/her own blood pressure or blood glucose at home?  no   Does the patient have any problems obtaining medications due to transportation or finances?   no  Understanding of regimen: good Understanding of indications: good Potential of compliance: good Patient understands to avoid NSAIDs. Patient understands to avoid decongestants.  Issues to address at subsequent visits: None   Pharmacist comments:  Mr. Lockett is a pleasant 50 yo M presenting without a medication list but with good recall of his regimen. He reports good compliance with his regimen and did not have any specific medication-related questions or concerns for me at this time.   Tyler Deis. Bonnye Fava, PharmD, BCPS, CPP Clinical Pharmacist Pager: [phone] Phone: [phone] 04/23/2016 9:16 AM      Time with patient: 8 minutes Preparation and documentation time: 2 minutes Total time: 10 minutes

## 2016-04-23 NOTE — Addendum Note (Signed)
Encounter addended by: Theresia Bough, CMA on: 04/23/2016  9:45 AM<BR>     Documentation filed: Dx Association, Patient Instructions Section, Orders

## 2016-04-23 NOTE — Patient Instructions (Signed)
Your physician recommends that you schedule a follow-up appointment in: 3 months with Dr. Bensimhon  

## 2016-05-03 ENCOUNTER — Other Ambulatory Visit (HOSPITAL_COMMUNITY): Payer: Self-pay | Admitting: Internal Medicine

## 2016-05-06 ENCOUNTER — Ambulatory Visit (INDEPENDENT_AMBULATORY_CARE_PROVIDER_SITE_OTHER): Payer: BC Managed Care – PPO | Admitting: *Deleted

## 2016-05-06 ENCOUNTER — Telehealth: Payer: Self-pay | Admitting: Internal Medicine

## 2016-05-06 ENCOUNTER — Encounter: Payer: Self-pay | Admitting: Internal Medicine

## 2016-05-06 DIAGNOSIS — Z95810 Presence of automatic (implantable) cardiac defibrillator: Secondary | ICD-10-CM | POA: Diagnosis not present

## 2016-05-06 LAB — CUP PACEART INCLINIC DEVICE CHECK
Battery Remaining Longevity: 57.6
Brady Statistic RA Percent Paced: 1.7 %
Brady Statistic RV Percent Paced: 99.85 %
Date Time Interrogation Session: 20170525171550
HIGH POWER IMPEDANCE MEASURED VALUE: 76.5 Ohm
Implantable Lead Implant Date: 20170426
Implantable Lead Implant Date: 20170426
Implantable Lead Location: 753859
Implantable Lead Model: 293
Lead Channel Impedance Value: 375 Ohm
Lead Channel Impedance Value: 462.5 Ohm
Lead Channel Impedance Value: 550 Ohm
Lead Channel Pacing Threshold Pulse Width: 0.8 ms
Lead Channel Sensing Intrinsic Amplitude: 3.9 mV
Lead Channel Setting Pacing Amplitude: 1.25 V
Lead Channel Setting Pacing Amplitude: 2.125
Lead Channel Setting Pacing Pulse Width: 0.8 ms
Lead Channel Setting Sensing Sensitivity: 0.5 mV
MDC IDC LEAD IMPLANT DT: 20170426
MDC IDC LEAD LOCATION: 753858
MDC IDC LEAD LOCATION: 753860
MDC IDC LEAD SERIAL: 385378
MDC IDC MSMT LEADCHNL LV PACING THRESHOLD AMPLITUDE: 0.75 V
MDC IDC MSMT LEADCHNL RV SENSING INTR AMPL: 12 mV
MDC IDC SET LEADCHNL RV PACING AMPLITUDE: 3.5 V
MDC IDC SET LEADCHNL RV PACING PULSEWIDTH: 0.4 ms
Pulse Gen Serial Number: 7307210

## 2016-05-06 NOTE — Telephone Encounter (Signed)
New Message  Pt requested to speak w/ rN about possibly adjusting his device. Please call back and discuss.

## 2016-05-06 NOTE — Telephone Encounter (Signed)
Pt calling back. C/o diaphragmatic stimulation when he is lying on his right side. He says that he does not feel it when lying on the left side or on his back. He can come to the office today at 4pm for reprogramming.

## 2016-05-06 NOTE — Telephone Encounter (Signed)
LMOM to call back directly to device clinic.

## 2016-05-06 NOTE — Progress Notes (Signed)
CRTD check in clinic for diaphragmatic stimulation. Multivector testing utilized to obtain thresholds. LV pacing vector changed from M2-RVC to distal tip-RVC with a 0.75V safety margin. Patient did not have stim in side-lying positions, on his back, sitting or standing. He will call back tomorrow if he notices stim when he is home. Follow up with SK in July as scheduled.

## 2016-07-12 ENCOUNTER — Encounter: Payer: BC Managed Care – PPO | Admitting: Internal Medicine

## 2016-07-15 NOTE — Progress Notes (Deleted)
Cardiology Office Note Date:  07/15/2016  Patient ID:  Travis Cline Feb 15, 1966, MRN [id number] PCP:  Lupita Raider, MD  Cardiologist:  Dr. Gala Romney Electrophysiologist: Dr. Graciela Husbands  ***refresh   Chief Complaint: device check  History of Present Illness: Travis Cline is a 50 y.o. male with history of NICM, HTN, HLD, LBBB s/p CRT-D implant 04/07/16, she was seen by Dr. Gala Romney 04/23/16 at that time doing well at that time.   He comes in today to be seen for Dr. Graciela Husbands, reports feeling ***  Device information: SJM CRT-D, implanted 04/07/16, primary prevention, Dr. Graciela Husbands Check 05/06/16 secondary to diaphragmatic stimulation, LV pacing vector changed from M2-RVC to distal tip-RVC with a 0.75V safety margin Post op check noted increased RA thresholds and decrease in RV measurement >> CXR noted by dr. Prescott Gum note to look OK     *** thresholds OK? *** diaph stim? *** symptoms? Syncope? Shocks? *** volume status, Corvue, weight   Past Medical History:  Diagnosis Date  . Asthma   . Cardiomyopathy (HCC)    a.  06/2015  echo: EF 15%, diffuse hypokinesis, anteroseptal/apical akinesis. Mild mitral regurgitation. Severely dilated left atrium. Mildly reduced RV function. ulnar artery systolic pressure 54 mmHg.  . CHF (congestive heart failure) (HCC)   . Erectile dysfunction   . Essential hypertension   . GERD (gastroesophageal reflux disease)   . Gout   . Hyperlipemia   . Insomnia   . LBBB (left bundle branch block)   . Morbid obesity (HCC)     Past Surgical History:  Procedure Laterality Date  . CARDIAC CATHETERIZATION N/A 07/21/2015   Procedure: Left Heart Cath and Coronary Angiography;  Surgeon: Lyn Records, MD;  Location: [facility] INVASIVE CV LAB;  Service: Cardiovascular;  Laterality: N/A;  . EP IMPLANTABLE DEVICE  04/07/2016   bvi  . EP IMPLANTABLE DEVICE N/A 04/07/2016   Procedure: BiV ICD Insertion CRT-D;  Surgeon: Duke Salvia, MD;  Location: [facility] INVASIVE CV LAB;   Service: Cardiovascular;  Laterality: N/A;  . HERNIA REPAIR      Current Outpatient Prescriptions  Medication Sig Dispense Refill  . albuterol (PROVENTIL HFA;VENTOLIN HFA) 108 (90 BASE) MCG/ACT inhaler Inhale 2 puffs into the lungs every 4 (four) hours as needed for wheezing or shortness of breath. Reported on 04/23/2016    . amLODipine (NORVASC) 10 MG tablet Take 1 tablet (10 mg total) by mouth daily. 90 tablet 3  . aspirin EC 81 MG tablet Take 1 tablet (81 mg total) by mouth daily.    . carvedilol (COREG) 25 MG tablet Take 1 tablet (25 mg total) by mouth 2 (two) times daily. 180 tablet 3  . cimetidine (TAGAMET) 200 MG tablet Take 200 mg by mouth 2 (two) times daily as needed. Reported on 02/24/2016    . colchicine 0.6 MG tablet Take 1 tablet (0.6 mg total) by mouth daily as needed. Reported on 04/23/2016 60 tablet 2  . Fluticasone-Salmeterol (ADVAIR) 250-50 MCG/DOSE AEPB Inhale 1 puff into the lungs every 12 (twelve) hours. (Patient not taking: Reported on 04/23/2016) 60 each 11  . furosemide (LASIX) 40 MG tablet Take 2 tablets (80 mg total) by mouth daily. 90 tablet 3  . HYDROcodone-acetaminophen (NORCO) 5-325 MG tablet Take 1 tablet by mouth every 6 (six) hours as needed for moderate pain. (Patient not taking: Reported on 04/23/2016) 10 tablet 0  . Multiple Vitamins-Minerals (MULTIVITAMIN WITH MINERALS) tablet Take 1 tablet by mouth daily.    . nitroGLYCERIN (NITROSTAT) 0.4  MG SL tablet Place 1 tablet (0.4 mg total) under the tongue every 5 (five) minutes as needed. (Patient not taking: Reported on 04/23/2016) 25 tablet 3  . sacubitril-valsartan (ENTRESTO) 97-103 MG Take 1 tablet by mouth 2 (two) times daily. 180 tablet 3  . simvastatin (ZOCOR) 20 MG tablet Take 1 tablet (20 mg total) by mouth daily. 90 tablet 3  . spironolactone (ALDACTONE) 25 MG tablet TAKE 0.5 TABLETS (12.5 MG TOTAL) BY MOUTH DAILY. 45 tablet 3  . ULORIC 40 MG tablet Take 40 mg by mouth daily.  3   No current  facility-administered medications for this visit.     Allergies:   Review of patient's allergies indicates no known allergies.   Social History:  The patient  reports that he has never smoked. He has never used smokeless tobacco. He reports that he drinks about 0.6 - 1.2 oz of alcohol per week . He reports that he does not use drugs.   Family History:  The patient's family history includes Arthritis in his father; Heart disease in his mother; Other in his father and mother.  ROS:  Please see the history of present illness.  All other systems are reviewed and otherwise negative.   PHYSICAL EXAM: *** VS:  There were no vitals taken for this visit. BMI: There is no height or weight on file to calculate BMI. Well nourished, well developed, in no acute distress  HEENT: normocephalic, atraumatic  Neck: no JVD, carotid bruits or masses Cardiac: RRR; no significant murmurs, no rubs, or gallops Lungs:  clear to auscultation bilaterally, no wheezing, rhonchi or rales  Abd: soft, nontender MS: no deformity or atrophy Ext: no edema  Skin: warm and dry, no rash Neuro:  No gross deficits appreciated Psych: euthymic mood, full affect  *** ICD site is stable, no tethering or discomfort   EKG:  Done 04/23/16 was SR, V paced ICD interrogation today: ***  LHC 8/6   Prox LAD lesion, 35% stenosed.  Prox Cx to Mid Cx lesion, 30% stenosed.  There is severe left ventricular systolic dysfunction  ECHO 07/2015 -->EF 15%. RV mildly dilated. Peak PA pressure 54 mm hg   CXR  07/15/15 shows borderline heart size, mild vasc congestion, low lung vols and basilar atx...   PFT 06/17/15 showed FVC=3.90 (75%), FEV1=2.88 (72%), %1sec=74, mid-flows reduced at 58% predicted; post-bronchodil showed an 8% improvement in FEV1; LungVols showed TLC=6.07 (86%), RV=1.97 (97%), DLCO=99%; These are c/w some mild obstruction/ small airways disease...  Recent Labs: 03/09/2016: ALT 32 03/31/2016: BUN 17; Creat 0.96;  Hemoglobin 16.5; Platelets 212; Potassium 4.9; Sodium 138  No results found for requested labs within last 8760 hours.   CrCl cannot be calculated (Unknown ideal weight.).   Wt Readings from Last 3 Encounters:  04/23/16 283 lb 12.8 oz (128.7 kg)  04/07/16 285 lb (129.3 kg)  03/16/16 285 lb (129.3 kg)     Other studies reviewed: Additional studies/records reviewed today include: summarized above  ASSESSMENT AND PLAN:  1. NICM, CHF     *** volume status appears stable     On BB/Entresto, lasix/aldactone  2. CRT-D     *** nornmal device function  3. HTN     *** stable   Disposition: F/u with ***  Current medicines are reviewed at length with the patient today.  The patient did not have any concerns regarding medicines.***  Signed, Sherrilee Gilles, PA-C 07/15/2016 1:20 PM     [facility] [address]  [place] [address] [phone] (office)  [phone] (fax)

## 2016-07-16 ENCOUNTER — Encounter: Payer: BC Managed Care – PPO | Admitting: Physician Assistant

## 2016-07-20 ENCOUNTER — Encounter: Payer: Self-pay | Admitting: Internal Medicine

## 2016-07-20 ENCOUNTER — Ambulatory Visit (INDEPENDENT_AMBULATORY_CARE_PROVIDER_SITE_OTHER): Payer: BC Managed Care – PPO | Admitting: Internal Medicine

## 2016-07-20 VITALS — BP 108/80 | HR 81 | Ht 70.0 in | Wt 281.8 lb

## 2016-07-20 DIAGNOSIS — I447 Left bundle-branch block, unspecified: Secondary | ICD-10-CM | POA: Diagnosis not present

## 2016-07-20 DIAGNOSIS — Z95810 Presence of automatic (implantable) cardiac defibrillator: Secondary | ICD-10-CM

## 2016-07-20 DIAGNOSIS — I429 Cardiomyopathy, unspecified: Secondary | ICD-10-CM | POA: Diagnosis not present

## 2016-07-20 DIAGNOSIS — I428 Other cardiomyopathies: Secondary | ICD-10-CM

## 2016-07-20 DIAGNOSIS — I5022 Chronic systolic (congestive) heart failure: Secondary | ICD-10-CM | POA: Diagnosis not present

## 2016-07-20 NOTE — Progress Notes (Signed)
Patient Care Team: Lupita Raider, MD as PCP - General (Family Medicine)   HPI  Travis Cline is a 50 y.o. 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  He is much improved. He is able now to climb a flight of stairs.   12/16 Echo 20-25  %  Past Medical History:  Diagnosis Date  . Asthma   . Cardiomyopathy (HCC)    a.  06/2015  echo: EF 15%, diffuse hypokinesis, anteroseptal/apical akinesis. Mild mitral regurgitation. Severely dilated left atrium. Mildly reduced RV function. ulnar artery systolic pressure 54 mmHg.  . CHF (congestive heart failure) (HCC)   . Erectile dysfunction   . Essential hypertension   . GERD (gastroesophageal reflux disease)   . Gout   . Hyperlipemia   . Insomnia   . LBBB (left bundle branch block)   . Morbid obesity (HCC)     Past Surgical History:  Procedure Laterality Date  . CARDIAC CATHETERIZATION N/A 07/21/2015   Procedure: Left Heart Cath and Coronary Angiography;  Surgeon: Lyn Records, MD;  Location: [facility] INVASIVE CV LAB;  Service: Cardiovascular;  Laterality: N/A;  . EP IMPLANTABLE DEVICE  04/07/2016   bvi  . EP IMPLANTABLE DEVICE N/A 04/07/2016   Procedure: BiV ICD Insertion CRT-D;  Surgeon: Duke Salvia, MD;  Location: [facility] INVASIVE CV LAB;  Service: Cardiovascular;  Laterality: N/A;  . HERNIA REPAIR      Current Outpatient Prescriptions  Medication Sig Dispense Refill  . albuterol (PROVENTIL HFA;VENTOLIN HFA) 108 (90 BASE) MCG/ACT inhaler Inhale 2 puffs into the lungs every 4 (four) hours as needed for wheezing or shortness of breath. Reported on 04/23/2016    . amLODipine (NORVASC) 10 MG tablet Take 1 tablet (10 mg total) by mouth daily. 90 tablet 3  . aspirin EC 81 MG tablet Take 1 tablet (81 mg total) by mouth daily.    . carvedilol (COREG) 25 MG tablet Take 1 tablet (25 mg total) by mouth 2 (two) times daily. 180 tablet 3  . cimetidine  (TAGAMET) 200 MG tablet Take 200 mg by mouth 2 (two) times daily as needed. Reported on 02/24/2016    . colchicine 0.6 MG tablet Take 1 tablet (0.6 mg total) by mouth daily as needed. Reported on 04/23/2016 60 tablet 2  . Fluticasone-Salmeterol (ADVAIR) 250-50 MCG/DOSE AEPB Inhale 1 puff into the lungs every 12 (twelve) hours. 60 each 11  . furosemide (LASIX) 40 MG tablet Take 2 tablets (80 mg total) by mouth daily. 90 tablet 3  . HYDROcodone-acetaminophen (NORCO) 5-325 MG tablet Take 1 tablet by mouth every 6 (six) hours as needed for moderate pain. 10 tablet 0  . Multiple Vitamins-Minerals (MULTIVITAMIN WITH MINERALS) tablet Take 1 tablet by mouth daily.    . nitroGLYCERIN (NITROSTAT) 0.4 MG SL tablet Place 1 tablet (0.4 mg total) under the tongue every 5 (five) minutes as needed. 25 tablet 3  . sacubitril-valsartan (ENTRESTO) 97-103 MG Take 1 tablet by mouth 2 (two) times daily. 180 tablet 3  . simvastatin (ZOCOR) 20 MG tablet Take 1 tablet (20 mg total) by mouth daily. 90 tablet 3  . spironolactone (ALDACTONE) 25 MG tablet TAKE 0.5 TABLETS (12.5 MG TOTAL) BY MOUTH DAILY. 45 tablet 3  . ULORIC 40 MG tablet Take 40 mg by mouth daily.  3   No current facility-administered medications for this visit.     No Known Allergies  Review of Systems negative except from HPI and PMH  Physical Exam BP 108/80 (BP Location: Left Arm, Patient Position: Sitting, Cuff Size: Large)   Pulse 81   Ht  (1.778 m)   Wt 281 lb 12.8 oz (127.8 kg)   BMI 40.43 kg/m  Well developed and well nourished in no acute distress HENT normal E scleral and icterus clear Neck Supple JVP flat; carotids brisk and full Device pocket well healed; without hematoma or erythema.  There is no tethering  Clear to ausculation Regular rate and rhythm, no murmurs gallops or rub Soft with active bowel sounds No clubbing cyanosis  Edema Alert and oriented, grossly normal motor and sensory function Skin Warm and Dry  ECG  demonstrates P synchronous pacing 81 intervals 19/15/41 QRS negative lead 1rS V1  Assessment and  Plan  NICM  LBBB  Fam Hx of LBBB/Cardiomyopathy  Morbidly obese\  Sleep disordered breathing  Congestive heart failure-chronic-systolic heart grade 3   He is much improved  We could consider AV optimization with ECG at next visit if his symptoms dictate looking to make positive the QRS complex in lead V1  We discussed weight loss and the importance. He will try low-carb diet.  I've asked when he see Dr. Dorthea Cove to consider discontinuation or reduction of his amlodipine and up titration of his Aldactone  Will anticipate heart failure clinic doing a follow-up echo

## 2016-07-20 NOTE — Patient Instructions (Addendum)
Medication Instructions: - Your physician recommends that you continue on your current medications as directed. Please refer to the Current Medication list given to you today.  Labwork: - none  Procedures/Testing: - none  Follow-Up: - Remote monitoring is used to monitor your Pacemaker of ICD from home. This monitoring reduces the number of office visits required to check your device to one time per year. It allows [place] to keep an eye on the functioning of your device to ensure it is working properly. You are scheduled for a device check from home on 10/19/16. You may send your transmission at any time that day. If you have a wireless device, the transmission will be sent automatically. After your physician reviews your transmission, you will receive a postcard with your next transmission date.  - Your physician wants you to follow-up in: 9 months with Gypsy Balsam, NP for Dr. Graciela Husbands. You will receive a reminder letter in the mail two months in advance. If you don't receive a letter, please call our office to schedule the follow-up appointment.  Any Additional Special Instructions Will Be Listed Below (If Applicable).     If you need a refill on your cardiac medications before your next appointment, please call your pharmacy.

## 2016-07-22 LAB — CUP PACEART INCLINIC DEVICE CHECK
Brady Statistic RA Percent Paced: 1.8 %
Date Time Interrogation Session: 20170808191012
HighPow Impedance: 83.25 Ohm
Implantable Lead Implant Date: 20170426
Implantable Lead Implant Date: 20170426
Implantable Lead Location: 753858
Implantable Lead Location: 753860
Implantable Lead Model: 293
Implantable Lead Serial Number: 385378
Lead Channel Impedance Value: 400 Ohm
Lead Channel Impedance Value: 475 Ohm
Lead Channel Impedance Value: 612.5 Ohm
Lead Channel Pacing Threshold Amplitude: 0.75 V
Lead Channel Pacing Threshold Amplitude: 0.75 V
Lead Channel Pacing Threshold Amplitude: 1.25 V
Lead Channel Pacing Threshold Pulse Width: 0.4 ms
Lead Channel Pacing Threshold Pulse Width: 0.4 ms
Lead Channel Pacing Threshold Pulse Width: 0.8 ms
Lead Channel Pacing Threshold Pulse Width: 0.8 ms
Lead Channel Sensing Intrinsic Amplitude: 12 mV
Lead Channel Setting Pacing Amplitude: 1.25 V
Lead Channel Setting Pacing Amplitude: 2 V
Lead Channel Setting Pacing Amplitude: 3.5 V
MDC IDC LEAD IMPLANT DT: 20170426
MDC IDC LEAD LOCATION: 753859
MDC IDC MSMT BATTERY REMAINING LONGEVITY: 57.6
MDC IDC MSMT LEADCHNL RA PACING THRESHOLD AMPLITUDE: 1.25 V
MDC IDC MSMT LEADCHNL RA SENSING INTR AMPL: 5 mV
MDC IDC MSMT LEADCHNL RV PACING THRESHOLD AMPLITUDE: 0.75 V
MDC IDC MSMT LEADCHNL RV PACING THRESHOLD AMPLITUDE: 0.75 V
MDC IDC MSMT LEADCHNL RV PACING THRESHOLD PULSEWIDTH: 0.4 ms
MDC IDC MSMT LEADCHNL RV PACING THRESHOLD PULSEWIDTH: 0.4 ms
MDC IDC PG SERIAL: 7307210
MDC IDC SET LEADCHNL LV PACING PULSEWIDTH: 0.8 ms
MDC IDC SET LEADCHNL RV PACING PULSEWIDTH: 0.4 ms
MDC IDC SET LEADCHNL RV SENSING SENSITIVITY: 0.5 mV
MDC IDC STAT BRADY RV PERCENT PACED: 99.98 %

## 2016-10-19 ENCOUNTER — Ambulatory Visit (INDEPENDENT_AMBULATORY_CARE_PROVIDER_SITE_OTHER): Payer: BC Managed Care – PPO | Admitting: *Deleted

## 2016-10-19 DIAGNOSIS — I428 Other cardiomyopathies: Secondary | ICD-10-CM

## 2016-10-19 NOTE — Progress Notes (Signed)
Remote ICD transmission.   

## 2016-10-20 ENCOUNTER — Encounter: Payer: Self-pay | Admitting: Cardiology

## 2016-11-11 ENCOUNTER — Other Ambulatory Visit: Payer: Self-pay | Admitting: Orthopedic Surgery

## 2016-11-16 ENCOUNTER — Encounter (HOSPITAL_BASED_OUTPATIENT_CLINIC_OR_DEPARTMENT_OTHER): Payer: Self-pay | Admitting: *Deleted

## 2016-11-16 LAB — CUP PACEART REMOTE DEVICE CHECK
Brady Statistic AP VP Percent: 1.5 %
Brady Statistic AP VS Percent: 1 %
Brady Statistic RA Percent Paced: 1.5 %
Date Time Interrogation Session: 20171107070016
HighPow Impedance: 82 Ohm
HighPow Impedance: 82 Ohm
Implantable Lead Location: 753859
Implantable Lead Serial Number: 385378
Lead Channel Impedance Value: 530 Ohm
Lead Channel Pacing Threshold Amplitude: 0.75 V
Lead Channel Pacing Threshold Amplitude: 0.75 V
Lead Channel Pacing Threshold Pulse Width: 0.4 ms
Lead Channel Pacing Threshold Pulse Width: 0.8 ms
Lead Channel Setting Pacing Amplitude: 1.25 V
Lead Channel Setting Pacing Pulse Width: 0.4 ms
Lead Channel Setting Sensing Sensitivity: 0.5 mV
MDC IDC LEAD IMPLANT DT: 20170426
MDC IDC LEAD IMPLANT DT: 20170426
MDC IDC LEAD IMPLANT DT: 20170426
MDC IDC LEAD LOCATION: 753858
MDC IDC LEAD LOCATION: 753860
MDC IDC LEAD MODEL: 293
MDC IDC MSMT BATTERY REMAINING LONGEVITY: 60 mo
MDC IDC MSMT BATTERY REMAINING PERCENTAGE: 86 %
MDC IDC MSMT BATTERY VOLTAGE: 3.01 V
MDC IDC MSMT LEADCHNL LV IMPEDANCE VALUE: 680 Ohm
MDC IDC MSMT LEADCHNL RA PACING THRESHOLD AMPLITUDE: 1.25 V
MDC IDC MSMT LEADCHNL RA PACING THRESHOLD PULSEWIDTH: 0.4 ms
MDC IDC MSMT LEADCHNL RA SENSING INTR AMPL: 3.9 mV
MDC IDC MSMT LEADCHNL RV IMPEDANCE VALUE: 400 Ohm
MDC IDC MSMT LEADCHNL RV SENSING INTR AMPL: 12 mV
MDC IDC PG IMPLANT DT: 20170426
MDC IDC SET LEADCHNL LV PACING PULSEWIDTH: 0.8 ms
MDC IDC SET LEADCHNL RA PACING AMPLITUDE: 2.25 V
MDC IDC SET LEADCHNL RV PACING AMPLITUDE: 3.5 V
MDC IDC STAT BRADY AS VP PERCENT: 98 %
MDC IDC STAT BRADY AS VS PERCENT: 1 %
Pulse Gen Serial Number: 7307210

## 2016-11-16 MED ORDER — DEXTROSE 5 % IV SOLN
3.0000 g | INTRAVENOUS | Status: AC
  Administered 2016-11-17: 3 g via INTRAVENOUS
  Filled 2016-11-16: qty 3000

## 2016-11-16 NOTE — H&P (Signed)
Travis RossettiDaniel J Cline is an 50 y.o. male.    Chief Complaint: Right Knee Pain  HPI: Travis BoomDaniel is a 50 year old new patient who comes in and day with complaint of persisting right knee pain.  He has a history of chronic knee pain but over the last couple months the knee pain is gotten worse.  He reports stepping down off a ladder and pivoting.  Upon doing so he felt a pop in his knee and has had ongoing pain since.  Some initial swelling.  His knee has felt weak.  He has difficulty going up or down steps now.  No locking.  No night pain.  He's been using a knee brace which was helpful early on but does not seem be helping as much now.  He's been using oral medications prescribed by his primary care provider.  Past Medical History:  Diagnosis Date  . Asthma   . Cardiomyopathy (HCC)    a.  06/2015  echo: EF 15%, diffuse hypokinesis, anteroseptal/apical akinesis. Mild mitral regurgitation. Severely dilated left atrium. Mildly reduced RV function. ulnar artery systolic pressure 54 mmHg.  . CHF (congestive heart failure) (HCC)   . Erectile dysfunction   . Essential hypertension   . GERD (gastroesophageal reflux disease)   . Gout   . Hyperlipemia   . Insomnia   . LBBB (left bundle branch block)   . Morbid obesity (HCC)     Past Surgical History:  Procedure Laterality Date  . CARDIAC CATHETERIZATION N/A 07/21/2015   Procedure: Left Heart Cath and Coronary Angiography;  Surgeon: Lyn RecordsHenry W Smith, MD;  [facility]VAS[facility] CV LAB;  Service: Cardiovascular;  Laterality: N/A;  . EP IMPLANTABLE DEVICE  04/07/2016   bvi  . EP IMPLANTABLE DEVICE N/A 04/07/2016   Procedure: BiV ICD Insertion CRT-D;  Surgeon: Duke SalviaSteven C Klein, MD;  [facility];  Service: Cardiovascular;  Laterality: N/A;  . HERNIA REPAIR      Family History  Problem Relation Age of Onset  . Other Father     alive & well @ 1472.  . Arthritis Father   . Other Mother     alive & well @ 2870.  Marland Kitchen. Heart disease Mother   . Other       siblings alive and well.   Social History:  reports that he has never smoked. He has never used smokeless tobacco. He reports that he drinks about 0.6 - 1.2 oz of alcohol per week . He reports that he does not use drugs.  Allergies: No Known Allergies  No prescriptions prior to admission.    No results found for this or any previous visit (from the past 48 hour(s)). No results found.  Review of Systems  Constitutional: Negative.   HENT: Negative.   Eyes:       Glasses  Respiratory: Negative.   Cardiovascular:       HTN  Gastrointestinal: Negative.   Genitourinary: Negative.   Musculoskeletal: Positive for joint pain.  Skin: Negative.   Neurological: Negative.   Endo/Heme/Allergies: Negative.   Psychiatric/Behavioral: Negative.     There were no vitals taken for this visit. Physical Exam  Constitutional: He is oriented to person, place, and time. He appears well-developed and well-nourished.  HENT:  Head: Normocephalic and atraumatic.  Eyes: Pupils are equal, round, and reactive to light.  Neck: Normal range of motion. Neck supple.  Cardiovascular: Intact distal pulses.   Respiratory: Effort normal.  Musculoskeletal: He exhibits tenderness.  Examination of the  right knee reveals no obvious erythema, warmth nor effusion.  He has some chronic skin changes along the anterior aspect of the knee.  He has moderate medial joint line tenderness.  McMurray is positive towards the medial joint line.  Normal valgus and varus stress.  Negative Lachman's.  No popliteal masses. Brief neuro: Normal strength.  Light touch sensation is intact.  Normal muscle tone. Skin exam: No signs of this infection.  No breakdown.  Chronic skin changes along the anterior aspect of his knees.   Neurological: He is alert and oriented to person, place, and time.  Skin: Skin is warm and dry.  Psychiatric: He has a normal mood and affect. His behavior is normal. Judgment and thought content normal.      Assessment/Plan Assessment: 1.  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  History of lower extremity edema 4.  History of left bundle branch block status post pacemaker placement  Plan: Due to the significant size and displacement of the medial meniscus along with his current symptoms it is unlikely a steroid injection will provide any significant improvement for a prolonged period of time.  With this in mind I discussed him with Dr. Turner Daniels who has offered the patient a knee arthroscopy to address his situation.    Becca Bayne R, PA-C 11/16/2016, 8:27 AM

## 2016-11-16 NOTE — Progress Notes (Addendum)
I was unable to reach patient by phone.  I left  A message on voice mail.  I instructed the patient to arrive at [facility] Main entrance at0900 , nothing to eat or drink after midnight.   I instructed the patient to take the following medications in the am with just enough water to get them down:  Amlodipine, Carvedilol.; if needed NTG, Colchicine, Vicodin, use inhalers if needed and bring Albuterol inhaler to the hospital with you. asked patient to not wear any lotions, powders, cologne, jewelry, piercing, make-up or nail polish.  I asked the patient to call [phone], in the am if there were any questions or problems.  Travis Cline has a pacemaker, history of CHF- last echo, EF 20- 25, cardiomyopathy.  I informed Dr Aleene Davidson of history, he said they would evaluate him in am.

## 2016-11-17 ENCOUNTER — Ambulatory Visit (HOSPITAL_COMMUNITY): Payer: BC Managed Care – PPO | Admitting: Anesthesiology

## 2016-11-17 ENCOUNTER — Encounter (HOSPITAL_COMMUNITY)
Admission: RE | Disposition: A | Discharge status: Home / Self Care | Payer: Self-pay | Source: Ambulatory Visit | Attending: Orthopedic Surgery

## 2016-11-17 ENCOUNTER — Ambulatory Visit (HOSPITAL_BASED_OUTPATIENT_CLINIC_OR_DEPARTMENT_OTHER)
Admission: RE | Admit: 2016-11-17 | Discharge: 2016-11-17 | Disposition: A | Discharge status: Home / Self Care | Payer: BC Managed Care – PPO | Source: Ambulatory Visit | Attending: Orthopedic Surgery | Admitting: Orthopedic Surgery

## 2016-11-17 ENCOUNTER — Encounter (HOSPITAL_COMMUNITY): Payer: Self-pay | Admitting: Certified Registered Nurse Anesthetist

## 2016-11-17 DIAGNOSIS — G8929 Other chronic pain: Secondary | ICD-10-CM | POA: Insufficient documentation

## 2016-11-17 DIAGNOSIS — I429 Cardiomyopathy, unspecified: Secondary | ICD-10-CM | POA: Insufficient documentation

## 2016-11-17 DIAGNOSIS — I340 Nonrheumatic mitral (valve) insufficiency: Secondary | ICD-10-CM | POA: Diagnosis not present

## 2016-11-17 DIAGNOSIS — E785 Hyperlipidemia, unspecified: Secondary | ICD-10-CM | POA: Insufficient documentation

## 2016-11-17 DIAGNOSIS — M1712 Unilateral primary osteoarthritis, left knee: Secondary | ICD-10-CM | POA: Diagnosis not present

## 2016-11-17 DIAGNOSIS — I447 Left bundle-branch block, unspecified: Secondary | ICD-10-CM | POA: Diagnosis not present

## 2016-11-17 DIAGNOSIS — M94261 Chondromalacia, right knee: Secondary | ICD-10-CM | POA: Insufficient documentation

## 2016-11-17 DIAGNOSIS — M109 Gout, unspecified: Secondary | ICD-10-CM | POA: Diagnosis not present

## 2016-11-17 DIAGNOSIS — S83241A Other tear of medial meniscus, current injury, right knee, initial encounter: Secondary | ICD-10-CM

## 2016-11-17 DIAGNOSIS — I509 Heart failure, unspecified: Secondary | ICD-10-CM | POA: Diagnosis not present

## 2016-11-17 DIAGNOSIS — G4700 Insomnia, unspecified: Secondary | ICD-10-CM | POA: Diagnosis not present

## 2016-11-17 DIAGNOSIS — I110 Hypertensive heart disease with heart failure: Secondary | ICD-10-CM | POA: Insufficient documentation

## 2016-11-17 DIAGNOSIS — M23205 Derangement of unspecified medial meniscus due to old tear or injury, unspecified knee: Secondary | ICD-10-CM | POA: Diagnosis present

## 2016-11-17 DIAGNOSIS — Z950 Presence of cardiac pacemaker: Secondary | ICD-10-CM | POA: Diagnosis not present

## 2016-11-17 DIAGNOSIS — Z8261 Family history of arthritis: Secondary | ICD-10-CM | POA: Diagnosis not present

## 2016-11-17 DIAGNOSIS — Z8249 Family history of ischemic heart disease and other diseases of the circulatory system: Secondary | ICD-10-CM | POA: Diagnosis not present

## 2016-11-17 DIAGNOSIS — M23221 Derangement of posterior horn of medial meniscus due to old tear or injury, right knee: Secondary | ICD-10-CM | POA: Diagnosis not present

## 2016-11-17 DIAGNOSIS — K219 Gastro-esophageal reflux disease without esophagitis: Secondary | ICD-10-CM | POA: Diagnosis not present

## 2016-11-17 DIAGNOSIS — Z6841 Body Mass Index (BMI) 40.0 and over, adult: Secondary | ICD-10-CM | POA: Diagnosis not present

## 2016-11-17 DIAGNOSIS — J45909 Unspecified asthma, uncomplicated: Secondary | ICD-10-CM | POA: Diagnosis not present

## 2016-11-17 HISTORY — PX: STERIOD INJECTION: SHX5046

## 2016-11-17 HISTORY — PX: KNEE ARTHROSCOPY: SHX127

## 2016-11-17 LAB — CBC
HEMATOCRIT: 48.3 % (ref 39.0–52.0)
Hemoglobin: 16.5 g/dL (ref 13.0–17.0)
MCH: 30.7 pg (ref 26.0–34.0)
MCHC: 34.2 g/dL (ref 30.0–36.0)
MCV: 89.9 fL (ref 78.0–100.0)
PLATELETS: 183 10*3/uL (ref 150–400)
RBC: 5.37 MIL/uL (ref 4.22–5.81)
RDW: 13 % (ref 11.5–15.5)
WBC: 8.3 10*3/uL (ref 4.0–10.5)

## 2016-11-17 LAB — COMPREHENSIVE METABOLIC PANEL
ALK PHOS: 54 U/L (ref 38–126)
ALT: 42 U/L (ref 17–63)
ANION GAP: 9 (ref 5–15)
AST: 35 U/L (ref 15–41)
Albumin: 3.9 g/dL (ref 3.5–5.0)
BILIRUBIN TOTAL: 0.7 mg/dL (ref 0.3–1.2)
BUN: 14 mg/dL (ref 6–20)
CALCIUM: 9.6 mg/dL (ref 8.9–10.3)
CO2: 22 mmol/L (ref 22–32)
Chloride: 107 mmol/L (ref 101–111)
Creatinine, Ser: 0.87 mg/dL (ref 0.61–1.24)
GFR calc non Af Amer: >60 mL/min (ref >60)
Glucose, Bld: 110 mg/dL — ABNORMAL HIGH (ref 65–99)
Potassium: 4.2 mmol/L (ref 3.5–5.1)
SODIUM: 138 mmol/L (ref 135–145)
TOTAL PROTEIN: 6.9 g/dL (ref 6.5–8.1)

## 2016-11-17 SURGERY — ARTHROSCOPY, KNEE
Anesthesia: Spinal | Site: Knee | Laterality: Right

## 2016-11-17 MED ORDER — GLYCOPYRROLATE 0.2 MG/ML IJ SOLN
INTRAMUSCULAR | Status: DC | PRN
  Administered 2016-11-17: 0.2 mg via INTRAVENOUS

## 2016-11-17 MED ORDER — EPINEPHRINE PF 1 MG/ML IJ SOLN
INTRAMUSCULAR | Status: DC | PRN
  Administered 2016-11-17: 1 mg

## 2016-11-17 MED ORDER — PHENYLEPHRINE HCL 10 MG/ML IJ SOLN
INTRAVENOUS | Status: DC | PRN
  Administered 2016-11-17: 50 ug/min via INTRAVENOUS

## 2016-11-17 MED ORDER — PROPOFOL 10 MG/ML IV BOLUS
INTRAVENOUS | Status: DC | PRN
  Administered 2016-11-17: 30 mg via INTRAVENOUS

## 2016-11-17 MED ORDER — METHYLPREDNISOLONE ACETATE 80 MG/ML IJ SUSP
INTRAMUSCULAR | Status: DC | PRN
  Administered 2016-11-17: 80 mg

## 2016-11-17 MED ORDER — PROPOFOL 1000 MG/100ML IV EMUL
INTRAVENOUS | Status: AC
  Filled 2016-11-17: qty 100

## 2016-11-17 MED ORDER — FENTANYL CITRATE (PF) 100 MCG/2ML IJ SOLN
INTRAMUSCULAR | Status: DC | PRN
  Administered 2016-11-17 (×2): 50 ug via INTRAVENOUS

## 2016-11-17 MED ORDER — ALBUTEROL SULFATE HFA 108 (90 BASE) MCG/ACT IN AERS
INHALATION_SPRAY | RESPIRATORY_TRACT | Status: AC
  Filled 2016-11-17: qty 6.7

## 2016-11-17 MED ORDER — ONDANSETRON HCL 4 MG/2ML IJ SOLN
INTRAMUSCULAR | Status: DC | PRN
  Administered 2016-11-17: 4 mg via INTRAVENOUS

## 2016-11-17 MED ORDER — FENTANYL CITRATE (PF) 100 MCG/2ML IJ SOLN
INTRAMUSCULAR | Status: AC
  Filled 2016-11-17: qty 2

## 2016-11-17 MED ORDER — DEXTROSE-NACL 5-0.45 % IV SOLN: INTRAVENOUS | Status: DC

## 2016-11-17 MED ORDER — BUPIVACAINE-EPINEPHRINE (PF) 0.25% -1:200000 IJ SOLN
INTRAMUSCULAR | Status: AC
  Filled 2016-11-17: qty 30

## 2016-11-17 MED ORDER — LACTATED RINGERS IV SOLN
INTRAVENOUS | Status: DC
  Administered 2016-11-17 (×2): via INTRAVENOUS

## 2016-11-17 MED ORDER — ONDANSETRON HCL 4 MG/2ML IJ SOLN
INTRAMUSCULAR | Status: AC
  Filled 2016-11-17: qty 2

## 2016-11-17 MED ORDER — CHLORHEXIDINE GLUCONATE 4 % EX LIQD: 60.0000 mL | Freq: Once | CUTANEOUS | Status: DC

## 2016-11-17 MED ORDER — BUPIVACAINE-EPINEPHRINE (PF) 0.25% -1:200000 IJ SOLN
INTRAMUSCULAR | Status: DC | PRN
  Administered 2016-11-17: 10 mL via PERINEURAL
  Administered 2016-11-17: 7 mL via PERINEURAL

## 2016-11-17 MED ORDER — KETAMINE HCL 10 MG/ML IJ SOLN
INTRAMUSCULAR | Status: DC | PRN
  Administered 2016-11-17: 20 mg via INTRAVENOUS
  Administered 2016-11-17: 10 mg via INTRAVENOUS

## 2016-11-17 MED ORDER — SODIUM CHLORIDE 0.9 % IR SOLN
Status: DC | PRN
  Administered 2016-11-17: 3000 mL

## 2016-11-17 MED ORDER — FENTANYL CITRATE (PF) 100 MCG/2ML IJ SOLN
25.0000 ug | INTRAMUSCULAR | Status: DC | PRN
  Administered 2016-11-17: 50 ug via INTRAVENOUS

## 2016-11-17 MED ORDER — MIDAZOLAM HCL 2 MG/2ML IJ SOLN
INTRAMUSCULAR | Status: AC
  Filled 2016-11-17: qty 2

## 2016-11-17 MED ORDER — MIDAZOLAM HCL 2 MG/2ML IJ SOLN
INTRAMUSCULAR | Status: DC | PRN
  Administered 2016-11-17 (×2): 1 mg via INTRAVENOUS

## 2016-11-17 MED ORDER — METHYLPREDNISOLONE ACETATE 80 MG/ML IJ SUSP
INTRAMUSCULAR | Status: AC
  Filled 2016-11-17: qty 1

## 2016-11-17 MED ORDER — EPINEPHRINE PF 1 MG/ML IJ SOLN
INTRAMUSCULAR | Status: AC
  Filled 2016-11-17: qty 2

## 2016-11-17 MED ORDER — LIDOCAINE 2% (20 MG/ML) 5 ML SYRINGE
INTRAMUSCULAR | Status: AC
  Filled 2016-11-17: qty 5

## 2016-11-17 MED ORDER — KETAMINE HCL-SODIUM CHLORIDE 100-0.9 MG/10ML-% IV SOSY
PREFILLED_SYRINGE | INTRAVENOUS | Status: AC
  Filled 2016-11-17: qty 10

## 2016-11-17 MED ORDER — HYDROCODONE-ACETAMINOPHEN 5-325 MG PO TABS: 1.0000 | ORAL_TABLET | Freq: Four times a day (QID) | ORAL | 0 refills | Status: DC | PRN

## 2016-11-17 MED ORDER — GLYCOPYRROLATE 0.2 MG/ML IV SOSY
PREFILLED_SYRINGE | INTRAVENOUS | Status: AC
  Filled 2016-11-17: qty 3

## 2016-11-17 MED ORDER — PROPOFOL 500 MG/50ML IV EMUL
INTRAVENOUS | Status: DC | PRN
  Administered 2016-11-17: 25 ug/kg/min via INTRAVENOUS

## 2016-11-17 MED ORDER — LIDOCAINE HCL (CARDIAC) 20 MG/ML IV SOLN
INTRAVENOUS | Status: DC | PRN
  Administered 2016-11-17: 50 mg via INTRATRACHEAL

## 2016-11-17 SURGICAL SUPPLY — 35 items
BANDAGE ACE 6X5 VEL STRL LF (GAUZE/BANDAGES/DRESSINGS) ×3 IMPLANT
BLADE CUDA 5.5 (BLADE) IMPLANT
BLADE CUTTER GATOR 3.5 (BLADE) ×3 IMPLANT
BLADE GREAT WHITE 4.2 (BLADE) ×3 IMPLANT
BUR OVAL 6.0 (BURR) IMPLANT
DRAPE ARTHROSCOPY W/POUCH 114 (DRAPES) ×3 IMPLANT
DRAPE IMP U-DRAPE 54X76 (DRAPES) ×3 IMPLANT
DRSG PAD ABDOMINAL 8X10 ST (GAUZE/BANDAGES/DRESSINGS) ×3 IMPLANT
DURAPREP 26ML APPLICATOR (WOUND CARE) ×3 IMPLANT
GAUZE SPONGE 4X4 12PLY STRL (GAUZE/BANDAGES/DRESSINGS) ×3 IMPLANT
GAUZE XEROFORM 1X8 LF (GAUZE/BANDAGES/DRESSINGS) ×3 IMPLANT
GLOVE BIO SURGEON STRL SZ7.5 (GLOVE) ×3 IMPLANT
GLOVE BIO SURGEON STRL SZ8.5 (GLOVE) ×6 IMPLANT
GLOVE BIOGEL PI IND STRL 8 (GLOVE) ×4 IMPLANT
GLOVE BIOGEL PI IND STRL 9 (GLOVE) ×2 IMPLANT
GLOVE BIOGEL PI INDICATOR 8 (GLOVE) ×2
GLOVE BIOGEL PI INDICATOR 9 (GLOVE) ×1
GOWN STRL REUS W/ TWL LRG LVL3 (GOWN DISPOSABLE) ×6 IMPLANT
GOWN STRL REUS W/ TWL XL LVL3 (GOWN DISPOSABLE) ×4 IMPLANT
GOWN STRL REUS W/TWL LRG LVL3 (GOWN DISPOSABLE) ×3
GOWN STRL REUS W/TWL XL LVL3 (GOWN DISPOSABLE) ×2
KIT BASIN OR (CUSTOM PROCEDURE TRAY) ×3 IMPLANT
KIT ROOM TURNOVER OR (KITS) ×3 IMPLANT
MANIFOLD NEPTUNE II (INSTRUMENTS) ×3 IMPLANT
PACK ARTHROSCOPY DSU (CUSTOM PROCEDURE TRAY) ×3 IMPLANT
PAD ARMBOARD 7.5X6 YLW CONV (MISCELLANEOUS) ×6 IMPLANT
PADDING CAST ABS 4INX4YD NS (CAST SUPPLIES) ×1
PADDING CAST ABS COTTON 4X4 ST (CAST SUPPLIES) ×2 IMPLANT
SET ARTHROSCOPY TUBING (MISCELLANEOUS) ×1
SET ARTHROSCOPY TUBING LN (MISCELLANEOUS) ×2 IMPLANT
SPONGE GAUZE 4X4 12PLY STER LF (GAUZE/BANDAGES/DRESSINGS) ×3 IMPLANT
SPONGE LAP 4X18 X RAY DECT (DISPOSABLE) ×3 IMPLANT
SYR CONTROL 10ML LL (SYRINGE) ×3 IMPLANT
TOWEL OR 17X24 6PK STRL BLUE (TOWEL DISPOSABLE) ×6 IMPLANT
WATER STERILE IRR 1000ML POUR (IV SOLUTION) ×3 IMPLANT

## 2016-11-17 NOTE — Anesthesia Procedure Notes (Signed)
Spinal  Patient location during procedure: OR Preanesthetic Checklist Completed: patient identified, site marked, surgical consent, pre-op evaluation, timeout performed, IV checked, risks and benefits discussed and monitors and equipment checked Spinal Block Patient position: sitting Prep: DuraPrep Patient monitoring: heart rate, cardiac monitor, continuous pulse ox and blood pressure Approach: midline Location: L3-4 Injection technique: single-shot Needle Needle type: Sprotte  Needle gauge: 24 G Needle length: 9 cm Assessment Sensory level: T4 Additional Notes 1.5 cc Marcaine RLD x 3 min

## 2016-11-17 NOTE — Op Note (Addendum)
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 Inject Left knee joint with 3cc betamethasone and 7cc 1/4% Marciane  withepinephrine  Surgeon: Feliberto Gottron. Turner Daniels M.D.  Assist: Tomi Likens. Gaylene Brooks  (present throughout entire procedure and necessary for timely completion of the procedure) Anes: Spinal EBL: Minimal  Fluids: 800 cc   Indications: Catching popping and pain in the right knee. Patient's been followed by my partner Dr. Althea Charon MRI scan shows posterior horn medial meniscal tear that is displaced. Pt has failed conservative treatment with anti-inflammatory medicines, physical therapy, and modified activites but did get good temporarily from an intra-articular cortisone injection. Pain has recurred and patient desires elective arthroscopic evaluation and treatment of knee. Risks and benefits of surgery have been discussed and questions answered.  Procedure: Patient identified by arm band and taken to the operating room at the day surgery Center. The appropriate anesthetic monitors were attached, and Spinal anesthesia was induced without difficulty. Lateral post was applied to the table and the lower extremity was prepped and draped in usual sterile fashion from the ankle to the midthigh. Time out procedure was performed. We began the operation by making standard inferior lateral and inferior medial peripatellar portals with a #11 blade allowing introduction of the arthroscope through the inferior lateral portal and the out flow to the inferior medial portal. Pump pressure was set at 100 mmHg and diagnostic arthroscopy  revealed patellofemoral joint was in excellent condition with grade 1 chondromalacia moving into the medial compartment  we identified grade 2 to grade 3 chondromalacia of the medial femoral condyle distally and posteriorly with flap tears debrided back to a stable margin using a 3.5 mm Gator sucker shaver. We visualized posterior horn of the medial meniscus large parrot-beak tear that was removed with a 4.2 gray-white sucker shaver and a 3.5 mm Gator sucker shaver. The anterior cruciate ligament and PCL are intact. The lateral compartment was in excellent condition. The gutters were cleared medially and laterally.. The knee was irrigated out normal saline solution. A dressing of xerofoam 4 x 4 dressing sponges, web roll and an Ace wrap was applied. The left knee medial parapatellar skin was sterilely prepped with alcohol, And using a 22-gauge by 1/2 inch needle we injected 3 cc of betamethasone and 7 cc of 1/4% Marcaine with epinephrine into the left knee joint.  A Band-Aid was applied.The patient was awakened extubated and taken to the recovery without difficulty.    Signed: Nestor Lewandowsky, MD

## 2016-11-17 NOTE — Addendum Note (Signed)
Addendum  created 11/17/16 1448 by Cristela Blue, MD   Order list changed, Order sets accessed

## 2016-11-17 NOTE — Anesthesia Postprocedure Evaluation (Signed)
Anesthesia Post Note  Patient: Travis Cline  Procedure(s) Performed: Procedure(s) (LRB): RIGHT ARTHROSCOPY KNEE WITH MEDIAL MENISECTOMY (Right) STEROID INJECTION left knee (Left)  Patient location during evaluation: PACU Anesthesia Type: Spinal Level of consciousness: awake Pain management: satisfactory to patient Vital Signs Assessment: post-procedure vital signs reviewed and stable Respiratory status: spontaneous breathing Cardiovascular status: blood pressure returned to baseline Postop Assessment: no headache and spinal receding Anesthetic complications: no    Last Vitals:  Vitals:   11/17/16 1245 11/17/16 1315  BP: 103/64 111/63  Pulse: 64 60  Resp: 14 10  Temp:      Last Pain:  Vitals:   11/17/16 0920  TempSrc: Oral                 Other Atienza EDWARD

## 2016-11-17 NOTE — Progress Notes (Signed)
Pt attempted to stand, but unable, states legs feel like rubber, Eric Pa aware and states pt has to walk and void prior to dc

## 2016-11-17 NOTE — Anesthesia Preprocedure Evaluation (Addendum)
Anesthesia Evaluation  Patient identified by MRN, date of birth, ID band Patient awake    Reviewed: Allergy & Precautions, H&P , Patient's Chart, lab work & pertinent test results  Airway Mallampati: III  TM Distance: >3 FB Neck ROM: full    Dental no notable dental hx.    Pulmonary    Pulmonary exam normal breath sounds clear to auscultation       Cardiovascular Exercise Tolerance: Good hypertension, +CHF   Rhythm:regular Rate:Normal     Neuro/Psych    GI/Hepatic   Endo/Other  Morbid obesity  Renal/GU      Musculoskeletal   Abdominal   Peds  Hematology   Anesthesia Other Findings   Reproductive/Obstetrics                           Anesthesia Physical Anesthesia Plan  ASA: III  Anesthesia Plan: Spinal   Post-op Pain Management:    Induction:   Airway Management Planned:   Additional Equipment:   Intra-op Plan:   Post-operative Plan:   Informed Consent: I have reviewed the patients History and Physical, chart, labs and discussed the procedure including the risks, benefits and alternatives for the proposed anesthesia with the patient or authorized representative who has indicated his/her understanding and acceptance.   Dental Advisory Given  Plan Discussed with: CRNA  Anesthesia Plan Comments: (Lab work confirmed with CRNA in room. Platelets okay. Discussed spinal anesthetic, and patient consents to the procedure:  included risk of possible headache,backache, failed block, allergic reaction, and nerve injury. This patient was asked if she had any questions or concerns before the procedure started. )        Anesthesia Quick Evaluation

## 2016-11-17 NOTE — Transfer of Care (Signed)
Immediate Anesthesia Transfer of Care Note  Patient: Travis Cline  Procedure(s) Performed: Procedure(s): RIGHT ARTHROSCOPY KNEE WITH MEDIAL MENISECTOMY (Right) STEROID INJECTION left knee (Left)  Patient Location: PACU  Anesthesia Type:MAC and Spinal  Level of Consciousness: awake, alert  and oriented  Airway & Oxygen Therapy: Patient Spontanous Breathing and Patient connected to nasal cannula oxygen  Post-op Assessment: Report given to RN and Post -op Vital signs reviewed and stable  Post vital signs: Reviewed and stable  Last Vitals:  Vitals:   11/17/16 0920  BP: (!) 141/91  Pulse: 84  Resp: 20  Temp: 36.5 C    Last Pain:  Vitals:   11/17/16 0920  TempSrc: Oral      Patients Stated Pain Goal: 4 (11/17/16 1001)  Complications: No apparent anesthesia complications

## 2016-11-17 NOTE — Interval H&P Note (Signed)
History and Physical Interval Note:  11/17/2016 11:07 AM  Mcarthur Rossetti Hilliard Clark  has presented today for surgery, with the diagnosis of RIGHT KNEE MEDIAL MENISCAL TEAR  The various methods of treatment have been discussed with the patient and family. After consideration of risks, benefits and other options for treatment, the patient has consented to  Procedure(s): ARTHROSCOPY KNEE (Right) as a surgical intervention .  The patient's history has been reviewed, patient examined, no change in status, stable for surgery.  I have reviewed the patient's chart and labs.  Questions were answered to the patient's satisfaction.     Nestor Lewandowsky

## 2016-11-17 NOTE — Progress Notes (Signed)
   11/17/16 1002  OBSTRUCTIVE SLEEP APNEA  Have you ever been diagnosed with sleep apnea through a sleep study? No  Do you snore loudly (loud enough to be heard through closed doors)?  1  Do you often feel tired, fatigued, or sleepy during the daytime (such as falling asleep during driving or talking to someone)? 0  Has anyone observed you stop breathing during your sleep? 0  Do you have, or are you being treated for high blood pressure? 1  BMI more than 35 kg/m2? 1  Age > 50 (1-yes) 0  Neck circumference greater than:Male 16 inches or larger, Male 17inches or larger? 1  Male Gender (Yes=1) 1  Obstructive Sleep Apnea Score 5

## 2016-11-17 NOTE — Progress Notes (Signed)
Pt ambulated around unit, voided 100 cc

## 2016-11-18 ENCOUNTER — Encounter (HOSPITAL_COMMUNITY): Payer: Self-pay | Admitting: Orthopedic Surgery

## 2016-12-17 ENCOUNTER — Other Ambulatory Visit (HOSPITAL_COMMUNITY): Payer: Self-pay | Admitting: Internal Medicine

## 2016-12-31 ENCOUNTER — Other Ambulatory Visit: Payer: Self-pay | Admitting: Internal Medicine

## 2017-01-11 ENCOUNTER — Other Ambulatory Visit: Payer: Self-pay | Admitting: Nurse Practitioner

## 2017-01-18 ENCOUNTER — Ambulatory Visit (INDEPENDENT_AMBULATORY_CARE_PROVIDER_SITE_OTHER): Payer: BC Managed Care – PPO | Admitting: *Deleted

## 2017-01-18 DIAGNOSIS — I447 Left bundle-branch block, unspecified: Secondary | ICD-10-CM

## 2017-01-18 NOTE — Progress Notes (Signed)
Remote ICD transmission.   

## 2017-01-20 ENCOUNTER — Other Ambulatory Visit (HOSPITAL_COMMUNITY): Payer: Self-pay | Admitting: *Deleted

## 2017-01-20 MED ORDER — FUROSEMIDE 40 MG PO TABS: 80.0000 mg | ORAL_TABLET | Freq: Every day | ORAL | 0 refills | Status: DC

## 2017-01-21 ENCOUNTER — Encounter: Payer: Self-pay | Admitting: Cardiology

## 2017-01-21 LAB — CUP PACEART REMOTE DEVICE CHECK
Battery Remaining Longevity: 57 mo
Battery Voltage: 2.96 V
Brady Statistic AP VP Percent: 1.2 %
Brady Statistic RA Percent Paced: 1.2 %
Date Time Interrogation Session: 20180206071924
HIGH POWER IMPEDANCE MEASURED VALUE: 82 Ohm
HIGH POWER IMPEDANCE MEASURED VALUE: 82 Ohm
Implantable Lead Implant Date: 20170426
Implantable Lead Location: 753859
Implantable Lead Model: 293
Lead Channel Impedance Value: 530 Ohm
Lead Channel Pacing Threshold Amplitude: 0.75 V
Lead Channel Pacing Threshold Amplitude: 0.75 V
Lead Channel Pacing Threshold Pulse Width: 0.4 ms
Lead Channel Pacing Threshold Pulse Width: 0.8 ms
Lead Channel Sensing Intrinsic Amplitude: 8.5 mV
Lead Channel Setting Pacing Pulse Width: 0.4 ms
Lead Channel Setting Sensing Sensitivity: 0.5 mV
MDC IDC LEAD IMPLANT DT: 20170426
MDC IDC LEAD IMPLANT DT: 20170426
MDC IDC LEAD LOCATION: 753858
MDC IDC LEAD LOCATION: 753860
MDC IDC LEAD SERIAL: 385378
MDC IDC MSMT BATTERY REMAINING PERCENTAGE: 82 %
MDC IDC MSMT LEADCHNL LV IMPEDANCE VALUE: 630 Ohm
MDC IDC MSMT LEADCHNL RA PACING THRESHOLD AMPLITUDE: 1 V
MDC IDC MSMT LEADCHNL RA PACING THRESHOLD PULSEWIDTH: 0.4 ms
MDC IDC MSMT LEADCHNL RA SENSING INTR AMPL: 3.3 mV
MDC IDC MSMT LEADCHNL RV IMPEDANCE VALUE: 400 Ohm
MDC IDC PG IMPLANT DT: 20170426
MDC IDC SET LEADCHNL LV PACING AMPLITUDE: 1.25 V
MDC IDC SET LEADCHNL LV PACING PULSEWIDTH: 0.8 ms
MDC IDC SET LEADCHNL RA PACING AMPLITUDE: 2 V
MDC IDC SET LEADCHNL RV PACING AMPLITUDE: 3.5 V
MDC IDC STAT BRADY AP VS PERCENT: 1 %
MDC IDC STAT BRADY AS VP PERCENT: 99 %
MDC IDC STAT BRADY AS VS PERCENT: 1 %
Pulse Gen Serial Number: 7307210

## 2017-02-10 ENCOUNTER — Encounter: Payer: Self-pay | Admitting: Nurse Practitioner

## 2017-02-14 ENCOUNTER — Other Ambulatory Visit: Payer: Self-pay | Admitting: Nurse Practitioner

## 2017-02-23 ENCOUNTER — Encounter (HOSPITAL_COMMUNITY): Payer: Self-pay | Admitting: Internal Medicine

## 2017-02-23 ENCOUNTER — Ambulatory Visit (HOSPITAL_COMMUNITY)
Admission: RE | Admit: 2017-02-23 | Discharge: 2017-02-23 | Disposition: A | Discharge status: Home / Self Care | Payer: BC Managed Care – PPO | Source: Ambulatory Visit | Attending: Internal Medicine | Admitting: Internal Medicine

## 2017-02-23 VITALS — BP 120/74 | HR 79 | Wt 291.0 lb

## 2017-02-23 DIAGNOSIS — I447 Left bundle-branch block, unspecified: Secondary | ICD-10-CM | POA: Diagnosis not present

## 2017-02-23 DIAGNOSIS — R062 Wheezing: Secondary | ICD-10-CM | POA: Insufficient documentation

## 2017-02-23 DIAGNOSIS — G4700 Insomnia, unspecified: Secondary | ICD-10-CM | POA: Diagnosis not present

## 2017-02-23 DIAGNOSIS — E785 Hyperlipidemia, unspecified: Secondary | ICD-10-CM | POA: Diagnosis not present

## 2017-02-23 DIAGNOSIS — M109 Gout, unspecified: Secondary | ICD-10-CM | POA: Diagnosis not present

## 2017-02-23 DIAGNOSIS — Z7982 Long term (current) use of aspirin: Secondary | ICD-10-CM | POA: Diagnosis not present

## 2017-02-23 DIAGNOSIS — N529 Male erectile dysfunction, unspecified: Secondary | ICD-10-CM | POA: Insufficient documentation

## 2017-02-23 DIAGNOSIS — Z7951 Long term (current) use of inhaled steroids: Secondary | ICD-10-CM | POA: Insufficient documentation

## 2017-02-23 DIAGNOSIS — I2510 Atherosclerotic heart disease of native coronary artery without angina pectoris: Secondary | ICD-10-CM | POA: Diagnosis not present

## 2017-02-23 DIAGNOSIS — K219 Gastro-esophageal reflux disease without esophagitis: Secondary | ICD-10-CM | POA: Diagnosis not present

## 2017-02-23 DIAGNOSIS — I429 Cardiomyopathy, unspecified: Secondary | ICD-10-CM | POA: Insufficient documentation

## 2017-02-23 DIAGNOSIS — I110 Hypertensive heart disease with heart failure: Secondary | ICD-10-CM | POA: Insufficient documentation

## 2017-02-23 DIAGNOSIS — Z6841 Body Mass Index (BMI) 40.0 and over, adult: Secondary | ICD-10-CM | POA: Diagnosis not present

## 2017-02-23 DIAGNOSIS — J45909 Unspecified asthma, uncomplicated: Secondary | ICD-10-CM | POA: Insufficient documentation

## 2017-02-23 DIAGNOSIS — I5022 Chronic systolic (congestive) heart failure: Secondary | ICD-10-CM | POA: Diagnosis not present

## 2017-02-23 MED ORDER — FUROSEMIDE 40 MG PO TABS: 80.0000 mg | ORAL_TABLET | Freq: Every day | ORAL | 3 refills | Status: DC

## 2017-02-23 MED ORDER — SPIRONOLACTONE 25 MG PO TABS: 25.0000 mg | ORAL_TABLET | Freq: Every day | ORAL | 3 refills | Status: DC

## 2017-02-23 NOTE — Patient Instructions (Signed)
Increase Spironolactone to 25 mg (1 Tablet) Once Daily  Lab work in 2 Weeks  Follow up in 6 Months along with Echo.

## 2017-02-23 NOTE — Progress Notes (Signed)
ADVANCED HF CLINIC NOTE  Patient ID: Travis Cline, male   DOB: Jun 02, 1966, 51 y.o.   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 Cath with non-obstructive CAD.   Underwent St Jude CRT-D implant on 04/07/16   Today he presents for HF follow up. Has not been seen in about 1 year. Feels better. 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 Mobility mostly limited by knee pain. Denies SOB, orthopnea or PND. Think BIV upgrade has helped with fatigue and reduced orthopnea.   Echo 12/16 EF ~25%. RV ok    EKG 07/21/15 NSR 92 bpm LBBB QRS 170   LHC 8/6   Prox LAD lesion, 35% stenosed.  Prox Cx to Mid Cx lesion, 30% stenosed.  There is severe left ventricular systolic dysfunction  ECHO 07/2015 -->EF 15%. RV mildly dilated. Peak PA pressure 54 mm hg   CXR  07/15/15 shows borderline heart size, mild vasc congestion, low lung vols and basilar atx...   PFT 06/17/15 showed FVC=3.90 (75%), FEV1=2.88 (72%), %1sec=74, mid-flows reduced at 58% predicted; post-bronchodil showed an 8% improvement in FEV1; LungVols showed TLC=6.07 (86%), RV=1.97 (97%), DLCO=99%; These are c/w some mild obstruction/ small airways disease...  Labs 07/16/2015 K 4.5 Creatinine 0.95  Hgb 17.3  Labs 09/12/2015: K 5.3 Creatinine 1.01   Labs 10/28/2015: K 54.7 Creatinine 0.93  SH:  Social History   Social History  . Marital status: Married    Spouse name: N/A  . Number of children: N/A  . Years of education: N/A   Occupational History  . Not on file.   Social History Main Topics  . Smoking status: Never Smoker  . Smokeless tobacco: Never Used  . Alcohol use 0.6 - 1.2 oz/week    1 - 2 Standard drinks or equivalent per week     Comment: was previously drinking 2-3 beers/day - currently drinking  about 4 beers/month.  . Drug use: No  . Sexual activity: Yes   Other Topics Concern  . Not on file   Social History Narrative    He lives in [place] with his wife and 2 stepchildren. He has 2 children of his own who live in  [place]. He does not routinely exercise. 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    FH:  Family History  Problem Relation Age of Onset  . Other Father     alive & well @ 50.  . Arthritis Father   . Other Mother     alive & well @ 8.  Marland Kitchen Heart disease Mother   . Other      siblings alive and well.    Past Medical History:  Diagnosis Date  . Asthma   . Cardiomyopathy (HCC)    a.  06/2015  echo: EF 15%, diffuse hypokinesis, anteroseptal/apical akinesis. Mild mitral regurgitation. Severely dilated left atrium. Mildly reduced RV function. ulnar artery systolic pressure 54 mmHg.  . CHF (congestive heart failure) (HCC)   . Erectile dysfunction   . Essential hypertension   . GERD (gastroesophageal reflux disease)   . Gout   . Hyperlipemia   . Insomnia   . LBBB (left bundle branch block)   . Morbid obesity (HCC)  Current Outpatient Prescriptions  Medication Sig Dispense Refill  . albuterol (PROVENTIL HFA;VENTOLIN HFA) 108 (90 BASE) MCG/ACT inhaler Inhale 2 puffs into the lungs every 4 (four) hours as needed for wheezing or shortness of breath. Reported on 04/23/2016    . amLODipine (NORVASC) 10 MG tablet Take 1 tablet (10 mg total) by mouth daily. 90 tablet 3  . aspirin EC 81 MG tablet Take 1 tablet (81 mg total) by mouth daily.    . carvedilol (COREG) 25 MG tablet Take 1 tablet (25 mg total) by mouth 2 (two) times daily. 180 tablet 3  . Fluticasone-Salmeterol (ADVAIR) 250-50 MCG/DOSE AEPB Inhale 1 puff into the lungs every 12 (twelve) hours. 60 each 11  . furosemide (LASIX) 40 MG tablet Take 2 tablets (80 mg total) by mouth daily. 60 tablet 0  . meloxicam (MOBIC) 15 MG tablet Take  15 mg by mouth daily. with food  2  . Multiple Vitamins-Minerals (MULTIVITAMIN WITH MINERALS) tablet Take 1 tablet by mouth daily.    . sacubitril-valsartan (ENTRESTO) 97-103 MG Take 1 tablet by mouth 2 (two) times daily. 180 tablet 3  . simvastatin (ZOCOR) 20 MG tablet Take 1 tablet (20 mg total) by mouth daily. 90 tablet 3  . spironolactone (ALDACTONE) 25 MG tablet TAKE 0.5 TABLETS (12.5 MG TOTAL) BY MOUTH DAILY. 45 tablet 3  . ULORIC 40 MG tablet Take 80 mg by mouth daily.   3  . cimetidine (TAGAMET) 200 MG tablet Take 200 mg by mouth 2 (two) times daily as needed. Reported on 02/24/2016    . colchicine 0.6 MG tablet Take 1 tablet (0.6 mg total) by mouth daily as needed. Reported on 04/23/2016 (Patient not taking: Reported on 02/23/2017) 60 tablet 2  . HYDROcodone-acetaminophen (NORCO) 5-325 MG tablet Take 1 tablet by mouth every 6 (six) hours as needed. (Patient not taking: Reported on 02/23/2017) 60 tablet 0  . NITROSTAT 0.4 MG SL tablet PLACE 1 TABLET (0.4 MG TOTAL) UNDER THE TONGUE EVERY 5 (FIVE) MINUTES AS NEEDED. (Patient not taking: Reported on 02/23/2017) 25 tablet 1   No current facility-administered medications for this encounter.     Vitals:   02/23/17 0931  BP: 120/74  Pulse: 79  SpO2: 99%  Weight: 291 lb (132 kg)    PHYSICAL EXAM:  General:  Well appearing. No resp difficulty. Ambulated in the clinic without difficulty.  HEENT: normal Neck: supple. JVP 7  Carotids 2+ bilaterally; no bruits. No lymphadenopathy or thryomegaly appreciated. Cor: PMI normal. Regular rate & rhythm.Wide split s2  No rubs, gallops or murmurs. Lungs: clear Abdomen: soft, nontender, nondistended. No hepatosplenomegaly. No bruits or masses. Good bowel sounds. Extremities: no cyanosis, clubbing, rash, tr-1+  edema Neuro: alert & orientedx3, cranial nerves grossly intact. Moves all 4 extremities w/o difficulty. Affect pleasant.   ASSESSMENT & PLAN:  1. Chronic Systolic HF NICM. Had LHC 8/8 with  nonobstructive CAD.  S/p SJ CRT-D 4/17 - Now s/p CRT-D. Improved NYHA II. Volume status looks slightly elevated.  - ECHO 12/16 EF 20-25% Septum dyskinetic from LBBB RV mildly hypokinetic. Will need repeat Echo.  - Continue carvedilol 25 mg twice a day.  - Continue entresto 97/103 bid - Increase spiro to 25 mg daily.  2. LBBB --s/p CTR-D.  3. HTN - Controlled.   4. Gout  - Continue current regimen per PCP 5. ETOH - Former heavy drinker. Rarely drinking alcohol.  6. Probable OSA - Said he checked with insurance and they wouldn't pay for it. Wife  denies apnea.   Arvilla Meres MD  9:54 AM

## 2017-02-23 NOTE — Addendum Note (Signed)
Encounter addended by: Suezanne Cheshire, RN on: 02/23/2017 10:12 AM<BR>    Actions taken: Order list changed, Diagnosis association updated, Sign clinical note

## 2017-03-09 ENCOUNTER — Ambulatory Visit (HOSPITAL_COMMUNITY)
Admission: RE | Admit: 2017-03-09 | Discharge: 2017-03-09 | Disposition: A | Discharge status: Home / Self Care | Payer: BC Managed Care – PPO | Source: Ambulatory Visit | Attending: Cardiology | Admitting: Cardiology

## 2017-03-09 DIAGNOSIS — I5022 Chronic systolic (congestive) heart failure: Secondary | ICD-10-CM

## 2017-03-09 LAB — BASIC METABOLIC PANEL
ANION GAP: 8 (ref 5–15)
BUN: 15 mg/dL (ref 6–20)
CO2: 24 mmol/L (ref 22–32)
Calcium: 9.7 mg/dL (ref 8.9–10.3)
Chloride: 105 mmol/L (ref 101–111)
Creatinine, Ser: 1.01 mg/dL (ref 0.61–1.24)
GFR calc Af Amer: >60 mL/min (ref >60)
GLUCOSE: 145 mg/dL — AB (ref 65–99)
POTASSIUM: 5.5 mmol/L — AB (ref 3.5–5.1)
Sodium: 137 mmol/L (ref 135–145)

## 2017-03-14 ENCOUNTER — Telehealth (HOSPITAL_COMMUNITY): Payer: Self-pay | Admitting: *Deleted

## 2017-03-14 DIAGNOSIS — I5022 Chronic systolic (congestive) heart failure: Secondary | ICD-10-CM

## 2017-03-14 MED ORDER — SPIRONOLACTONE 25 MG PO TABS: 12.5000 mg | ORAL_TABLET | Freq: Every day | ORAL | 3 refills | Status: DC

## 2017-03-14 NOTE — Telephone Encounter (Signed)
Notes recorded by Noralee Space, RN on 03/14/2017 at 4:57 PM EDT Pt aware and agreeable, labs sch 4/9

## 2017-03-14 NOTE — Telephone Encounter (Signed)
-----   Message from Dolores Patty, MD sent at 03/10/2017 11:13 PM EDT ----- Cut spiro back to 12.5. Repeat 1 week

## 2017-03-21 ENCOUNTER — Ambulatory Visit (HOSPITAL_COMMUNITY)
Admission: RE | Admit: 2017-03-21 | Discharge: 2017-03-21 | Disposition: A | Discharge status: Home / Self Care | Payer: BC Managed Care – PPO | Source: Ambulatory Visit | Attending: Cardiology | Admitting: Cardiology

## 2017-03-21 DIAGNOSIS — I5022 Chronic systolic (congestive) heart failure: Secondary | ICD-10-CM | POA: Diagnosis present

## 2017-03-21 LAB — BASIC METABOLIC PANEL
Anion gap: 11 (ref 5–15)
BUN: 22 mg/dL — AB (ref 6–20)
CALCIUM: 9.8 mg/dL (ref 8.9–10.3)
CO2: 23 mmol/L (ref 22–32)
Chloride: 99 mmol/L — ABNORMAL LOW (ref 101–111)
Creatinine, Ser: 0.99 mg/dL (ref 0.61–1.24)
GFR calc Af Amer: >60 mL/min (ref >60)
GLUCOSE: 156 mg/dL — AB (ref 65–99)
POTASSIUM: 4.8 mmol/L (ref 3.5–5.1)
Sodium: 133 mmol/L — ABNORMAL LOW (ref 135–145)

## 2017-03-27 ENCOUNTER — Other Ambulatory Visit: Payer: Self-pay | Admitting: Nurse Practitioner

## 2017-03-27 DIAGNOSIS — I5022 Chronic systolic (congestive) heart failure: Secondary | ICD-10-CM

## 2017-03-29 NOTE — Progress Notes (Signed)
Electrophysiology Office Note Date: 03/31/2017  ID:  Travis Cline, DOB [birth year], MRN [id number]  PCP: Lupita Raider, MD Primary Cardiologist: Bensimhon Electrophysiologist: Graciela Husbands  CC: Routine ICD follow-up  Travis Cline is a 51 y.o. male seen today for Dr Graciela Husbands.  He presents today for routine electrophysiology followup.  Since last being seen in our clinic, the patient reports doing very well. He denies chest pain, palpitations, dyspnea, PND, orthopnea, nausea, vomiting, dizziness, syncope, edema, weight gain, or early satiety.  He has not had ICD shocks.   Device History: STJ CRTD implanted 2017 for NICM, CHF History of appropriate therapy: No History of AAD therapy: No   Past Medical History:  Diagnosis Date  . Asthma   . Cardiomyopathy (HCC)    a.  06/2015  echo: EF 15%, diffuse hypokinesis, anteroseptal/apical akinesis. Mild mitral regurgitation. Severely dilated left atrium. Mildly reduced RV function. ulnar artery systolic pressure 54 mmHg.  . CHF (congestive heart failure) (HCC)   . Erectile dysfunction   . Essential hypertension   . GERD (gastroesophageal reflux disease)   . Gout   . Hyperlipemia   . Insomnia   . LBBB (left bundle branch block)   . Morbid obesity (HCC)    Past Surgical History:  Procedure Laterality Date  . CARDIAC CATHETERIZATION N/A 07/21/2015   Procedure: Left Heart Cath and Coronary Angiography;  Surgeon: Lyn Records, MD;  Location: [facility] INVASIVE CV LAB;  Service: Cardiovascular;  Laterality: N/A;  . EP IMPLANTABLE DEVICE  04/07/2016   bvi  . EP IMPLANTABLE DEVICE N/A 04/07/2016   Procedure: BiV ICD Insertion CRT-D;  Surgeon: Duke Salvia, MD;  Location: [facility] INVASIVE CV LAB;  Service: Cardiovascular;  Laterality: N/A;  . HERNIA REPAIR    . KNEE ARTHROSCOPY Right 11/17/2016   Procedure: RIGHT ARTHROSCOPY KNEE WITH MEDIAL MENISECTOMY;  Surgeon: Gean Birchwood, MD;  Location: [facility] OR;  Service: Orthopedics;  Laterality: Right;  . STERIOD  INJECTION Left 11/17/2016   Procedure: STEROID INJECTION left knee;  Surgeon: Gean Birchwood, MD;  Location: [facility] OR;  Service: Orthopedics;  Laterality: Left;    Current Outpatient Prescriptions  Medication Sig Dispense Refill  . albuterol (PROVENTIL HFA;VENTOLIN HFA) 108 (90 BASE) MCG/ACT inhaler Inhale 2 puffs into the lungs every 4 (four) hours as needed for wheezing or shortness of breath. Reported on 04/23/2016    . amLODipine (NORVASC) 10 MG tablet Take 1 tablet (10 mg total) by mouth daily. 90 tablet 3  . aspirin EC 81 MG tablet Take 1 tablet (81 mg total) by mouth daily.    . carvedilol (COREG) 25 MG tablet Take 1 tablet (25 mg total) by mouth 2 (two) times daily. 180 tablet 3  . cimetidine (TAGAMET) 200 MG tablet Take 200 mg by mouth 2 (two) times daily as needed. Reported on 02/24/2016    . colchicine 0.6 MG tablet Take 1 tablet (0.6 mg total) by mouth daily as needed. Reported on 04/23/2016 60 tablet 2  . ENTRESTO 97-103 MG TAKE 1 TABLET BY MOUTH 2 (TWO) TIMES DAILY. 180 tablet 3  . Fluticasone-Salmeterol (ADVAIR) 250-50 MCG/DOSE AEPB Inhale 1 puff into the lungs every 12 (twelve) hours. 60 each 11  . furosemide (LASIX) 40 MG tablet Take 2 tablets (80 mg total) by mouth daily. 180 tablet 3  . HYDROcodone-acetaminophen (NORCO) 5-325 MG tablet Take 1 tablet by mouth every 6 (six) hours as needed. 60 tablet 0  . meloxicam (MOBIC) 15 MG tablet Take 15 mg by mouth daily.  with food  2  . Multiple Vitamins-Minerals (MULTIVITAMIN WITH MINERALS) tablet Take 1 tablet by mouth daily.    Marland Kitchen NITROSTAT 0.4 MG SL tablet PLACE 1 TABLET (0.4 MG TOTAL) UNDER THE TONGUE EVERY 5 (FIVE) MINUTES AS NEEDED. 25 tablet 1  . simvastatin (ZOCOR) 20 MG tablet TAKE 1 TABLET (20 MG TOTAL) BY MOUTH DAILY. 90 tablet 3  . spironolactone (ALDACTONE) 25 MG tablet Take 0.5 tablets (12.5 mg total) by mouth daily. 90 tablet 3  . ULORIC 40 MG tablet Take 80 mg by mouth daily.   3   No current facility-administered medications for  this visit.     Allergies:   No known allergies   Social History: Social History   Social History  . Marital status: Married    Spouse name: N/A  . Number of children: N/A  . Years of education: N/A   Occupational History  . Not on file.   Social History Main Topics  . Smoking status: Never Smoker  . Smokeless tobacco: Never Used  . Alcohol use 0.6 - 1.2 oz/week    1 - 2 Standard drinks or equivalent per week     Comment: was previously drinking 2-3 beers/day - currently drinking about 4 beers/month.  . Drug use: No  . Sexual activity: Yes   Other Topics Concern  . Not on file   Social History Narrative    He lives in [place] with his wife and 2 stepchildren. He has 2 children of his own who live in  [place]. He does not routinely exercise. 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    Family History: Family History  Problem Relation Age of Onset  . Other Father     alive & well @ 52.  . Arthritis Father   . Other Mother     alive & well @ 93.  Marland Kitchen Heart disease Mother   . Other      siblings alive and well.    Review of Systems: All other systems reviewed and are otherwise negative except as noted above.   Physical Exam: VS:  BP 106/80   Pulse 83   Ht 5\' 10"  (1.778 m)   Wt 294 lb (133.4 kg)   SpO2 96%   BMI 42.18 kg/m  , BMI Body mass index is 42.18 kg/m.  GEN- The patient is obese appearing, alert and oriented x 3 today.   HEENT: normocephalic, atraumatic; sclera clear, conjunctiva pink; hearing intact; oropharynx clear; neck supple  Lungs- Clear to ausculation bilaterally, normal work of breathing.  No wheezes, rales, rhonchi Heart- Regular rate and rhythm, no murmurs, rubs or gallops  GI- soft, non-tender, non-distended, bowel sounds present  Extremities- no clubbing, cyanosis, or edema  MS- no significant deformity or atrophy Skin- warm and dry, no rash or lesion; ICD pocket  well healed Psych- euthymic mood, full affect Neuro- strength and sensation are intact  ICD interrogation- reviewed in detail today,  See PACEART report  EKG:  EKG is ordered today. The ekg ordered today shows sinus rhythm with V pacing   Recent Labs: 11/17/2016: ALT 42; Hemoglobin 16.5; Platelets 183 03/21/2017: BUN 22; Creatinine, Ser 0.99; Potassium 4.8; Sodium 133   Wt Readings from Last 3 Encounters:  03/31/17 294 lb (133.4 kg)  02/23/17 291 lb (132 kg)  11/17/16 280 lb (127 kg)     Other studies Reviewed: Additional studies/ records that were reviewed  today include: Dr Graciela Husbands and AHF notes   Assessment and Plan:  1.  Chronic systolic dysfunction euvolemic today Stable on an appropriate medical regimen Normal ICD function See Pace Art report No changes today Update echo post CRT. If EF remains depressed, would bring back for VV and AV optimization  Enroll in ICM clinic if EF remains depressed  2.  HTN Stable No change required today   Current medicines are reviewed at length with the patient today.   The patient does not have concerns regarding his medicines.  The following changes were made today:  none  Labs/ tests ordered today include: echo Orders Placed This Encounter  Procedures  . CUP PACEART INCLINIC DEVICE CHECK  . EKG 12-Lead  . ECHOCARDIOGRAM COMPLETE     Disposition:   Follow up with Arsenio Katz, ICM clinic, Dr Graciela Husbands 6 months, AHF clinic as scheduled    Signed, Gypsy Balsam, NP 03/31/2017 8:40 AM  [facility] [address] [place] [address] [phone] (office) [phone] (fax)

## 2017-03-31 ENCOUNTER — Ambulatory Visit (INDEPENDENT_AMBULATORY_CARE_PROVIDER_SITE_OTHER): Payer: BC Managed Care – PPO | Admitting: Nurse Practitioner

## 2017-03-31 ENCOUNTER — Encounter: Payer: Self-pay | Admitting: Nurse Practitioner

## 2017-03-31 VITALS — BP 106/80 | HR 83 | Ht 70.0 in | Wt 294.0 lb

## 2017-03-31 DIAGNOSIS — I10 Essential (primary) hypertension: Secondary | ICD-10-CM | POA: Diagnosis not present

## 2017-03-31 DIAGNOSIS — I5022 Chronic systolic (congestive) heart failure: Secondary | ICD-10-CM

## 2017-03-31 LAB — CUP PACEART INCLINIC DEVICE CHECK
Implantable Lead Implant Date: 20170426
Implantable Lead Implant Date: 20170426
Implantable Lead Location: 753858
Implantable Lead Model: 293
MDC IDC LEAD IMPLANT DT: 20170426
MDC IDC LEAD LOCATION: 753859
MDC IDC LEAD LOCATION: 753860
MDC IDC LEAD SERIAL: 385378
MDC IDC PG IMPLANT DT: 20170426
MDC IDC SESS DTM: 20180419082352
Pulse Gen Serial Number: 7307210

## 2017-03-31 NOTE — Patient Instructions (Addendum)
Medication Instructions:  None Ordered   Labwork: None Ordered   Testing/Procedures: Your physician has requested that you have an echocardiogram. Echocardiography is a painless test that uses sound waves to create images of your heart. It provides your doctor with information about the size and shape of your heart and how well your heart's chambers and valves are working. This procedure takes approximately one hour. There are no restrictions for this procedure.     Follow-Up: Remote monitoring is used to monitor your Pacemaker from home. This monitoring reduces the number of office visits required to check your device to one time per year. It allows [place] to keep an eye on the functioning of your device to ensure it is working properly. You are scheduled for a device check from home on 06/30/2017. You may send your transmission at any time that day. If you have a wireless device, the transmission will be sent automatically. After your physician reviews your transmission, you will receive a postcard with your next transmission date.  Your physician wants you to follow-up in: 6 months with Dr. Graciela Husbands. You will receive a reminder letter in the mail two months in advance. If you don't receive a letter, please call our office to schedule the follow-up appointment.   Any Other Special Instructions Will Be Listed Below (If Applicable).     If you need a refill on your cardiac medications before your next appointment, please call your pharmacy.

## 2017-04-01 ENCOUNTER — Telehealth (HOSPITAL_COMMUNITY): Payer: Self-pay | Admitting: Pharmacist

## 2017-04-01 NOTE — Telephone Encounter (Signed)
Entresto 97-103 mg BID PA approved by [facility] [employer] through 04/01/20.   Tyler Deis. Bonnye Fava, PharmD, BCPS, CPP Clinical Pharmacist Pager: [phone] Phone: [phone] 04/01/2017 3:55 PM

## 2017-04-13 ENCOUNTER — Ambulatory Visit (HOSPITAL_COMMUNITY): Payer: BC Managed Care – PPO | Attending: Cardiology

## 2017-04-13 ENCOUNTER — Other Ambulatory Visit: Payer: Self-pay

## 2017-04-13 ENCOUNTER — Encounter: Payer: BC Managed Care – PPO | Admitting: Nurse Practitioner

## 2017-04-13 DIAGNOSIS — I071 Rheumatic tricuspid insufficiency: Secondary | ICD-10-CM | POA: Diagnosis not present

## 2017-04-13 DIAGNOSIS — I5022 Chronic systolic (congestive) heart failure: Secondary | ICD-10-CM | POA: Insufficient documentation

## 2017-04-13 DIAGNOSIS — I371 Nonrheumatic pulmonary valve insufficiency: Secondary | ICD-10-CM | POA: Diagnosis not present

## 2017-04-13 DIAGNOSIS — E785 Hyperlipidemia, unspecified: Secondary | ICD-10-CM | POA: Diagnosis not present

## 2017-04-13 DIAGNOSIS — I110 Hypertensive heart disease with heart failure: Secondary | ICD-10-CM | POA: Diagnosis not present

## 2017-04-13 LAB — ECHOCARDIOGRAM COMPLETE
E decel time: 180 msec
EERAT: 8.03
FS: 21 % — AB (ref 28–44)
IV/PV OW: 0.68
LA diam end sys: 48 mm
LA vol A4C: 69.2 ml
LA vol index: 25.9 mL/m2
LADIAMINDEX: 1.83 cm/m2
LASIZE: 48 mm
LAVOL: 68.2 mL
LV TDI E'LATERAL: 7.07
LV e' LATERAL: 7.07 cm/s
LVEEAVG: 8.03
LVEEMED: 8.03
LVOT SV: 71 mL
LVOT VTI: 15.7 cm
LVOT area: 4.52 cm2
LVOT peak vel: 72.4 cm/s
LVOTD: 24 mm
MV Dec: 180
MV pk A vel: 84.9 m/s
MVPKEVEL: 56.8 m/s
PW: 14.7 mm — AB (ref 0.6–1.1)
RV LATERAL S' VELOCITY: 12.8 cm/s
RV TAPSE: 20.4 mm
RV sys press: 26 mmHg
Reg peak vel: 240 cm/s
TDI e' medial: 6.74
TR max vel: 240 cm/s
TVPG: 240 mmHg

## 2017-04-15 ENCOUNTER — Encounter: Payer: BC Managed Care – PPO | Admitting: Nurse Practitioner

## 2017-04-15 ENCOUNTER — Encounter: Payer: Self-pay | Admitting: Nurse Practitioner

## 2017-04-15 ENCOUNTER — Telehealth: Payer: Self-pay | Admitting: Internal Medicine

## 2017-04-15 NOTE — Telephone Encounter (Signed)
Pt returning call to Corrine regarding Echo results

## 2017-04-15 NOTE — Addendum Note (Signed)
Addended by: Etheleen Mayhew C on: 04/15/2017 02:48 PM   Modules accepted: Orders

## 2017-04-18 NOTE — Telephone Encounter (Signed)
Follow up ° °Pt voiced returning nurses call. ° °Please f/u °

## 2017-04-18 NOTE — Telephone Encounter (Signed)
Spoke with patient, voiced results.

## 2017-05-13 ENCOUNTER — Other Ambulatory Visit: Payer: Self-pay | Admitting: Nurse Practitioner

## 2017-06-03 IMAGING — CR DG CHEST 2V
2 series · 2 of 2 positions shown · non-contrast
Comparison: None.

CLINICAL DATA: Shortness of breath.

EXAM:
CHEST  2 VIEW

[view not recorded (1 of 2)]
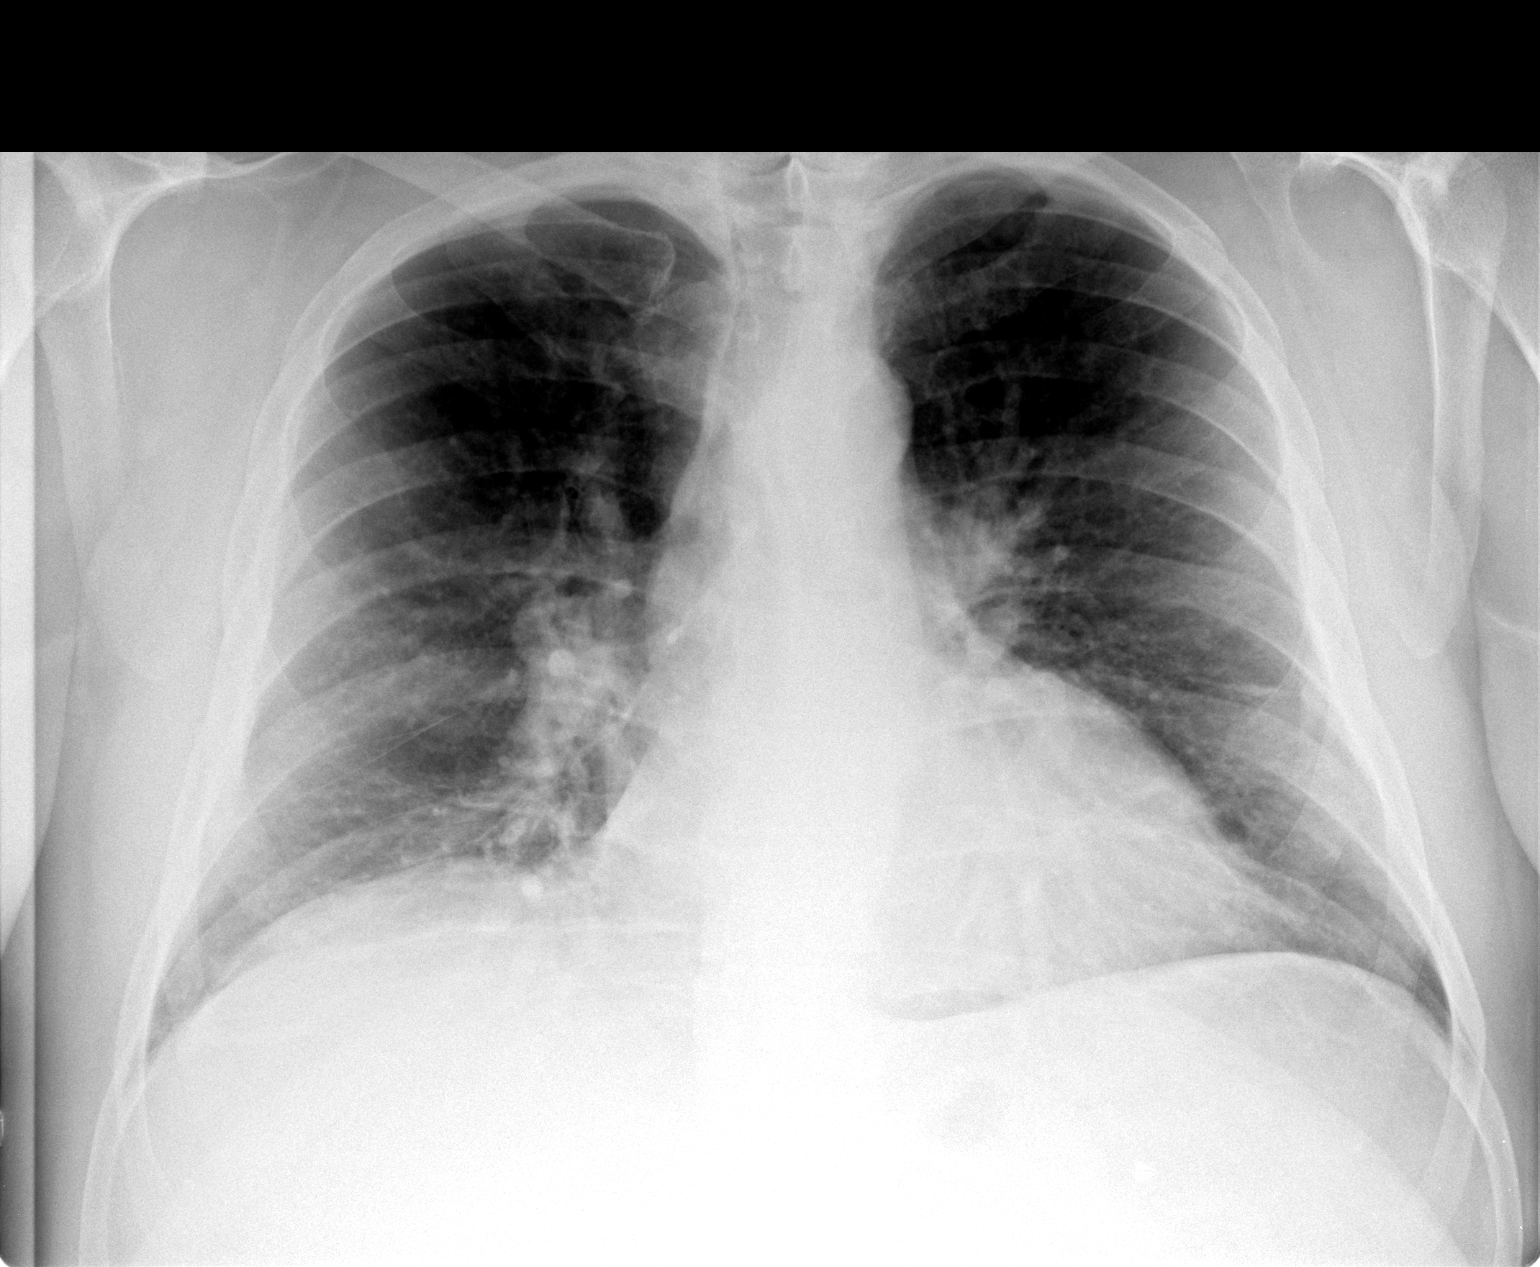

[view not recorded (2 of 2)]
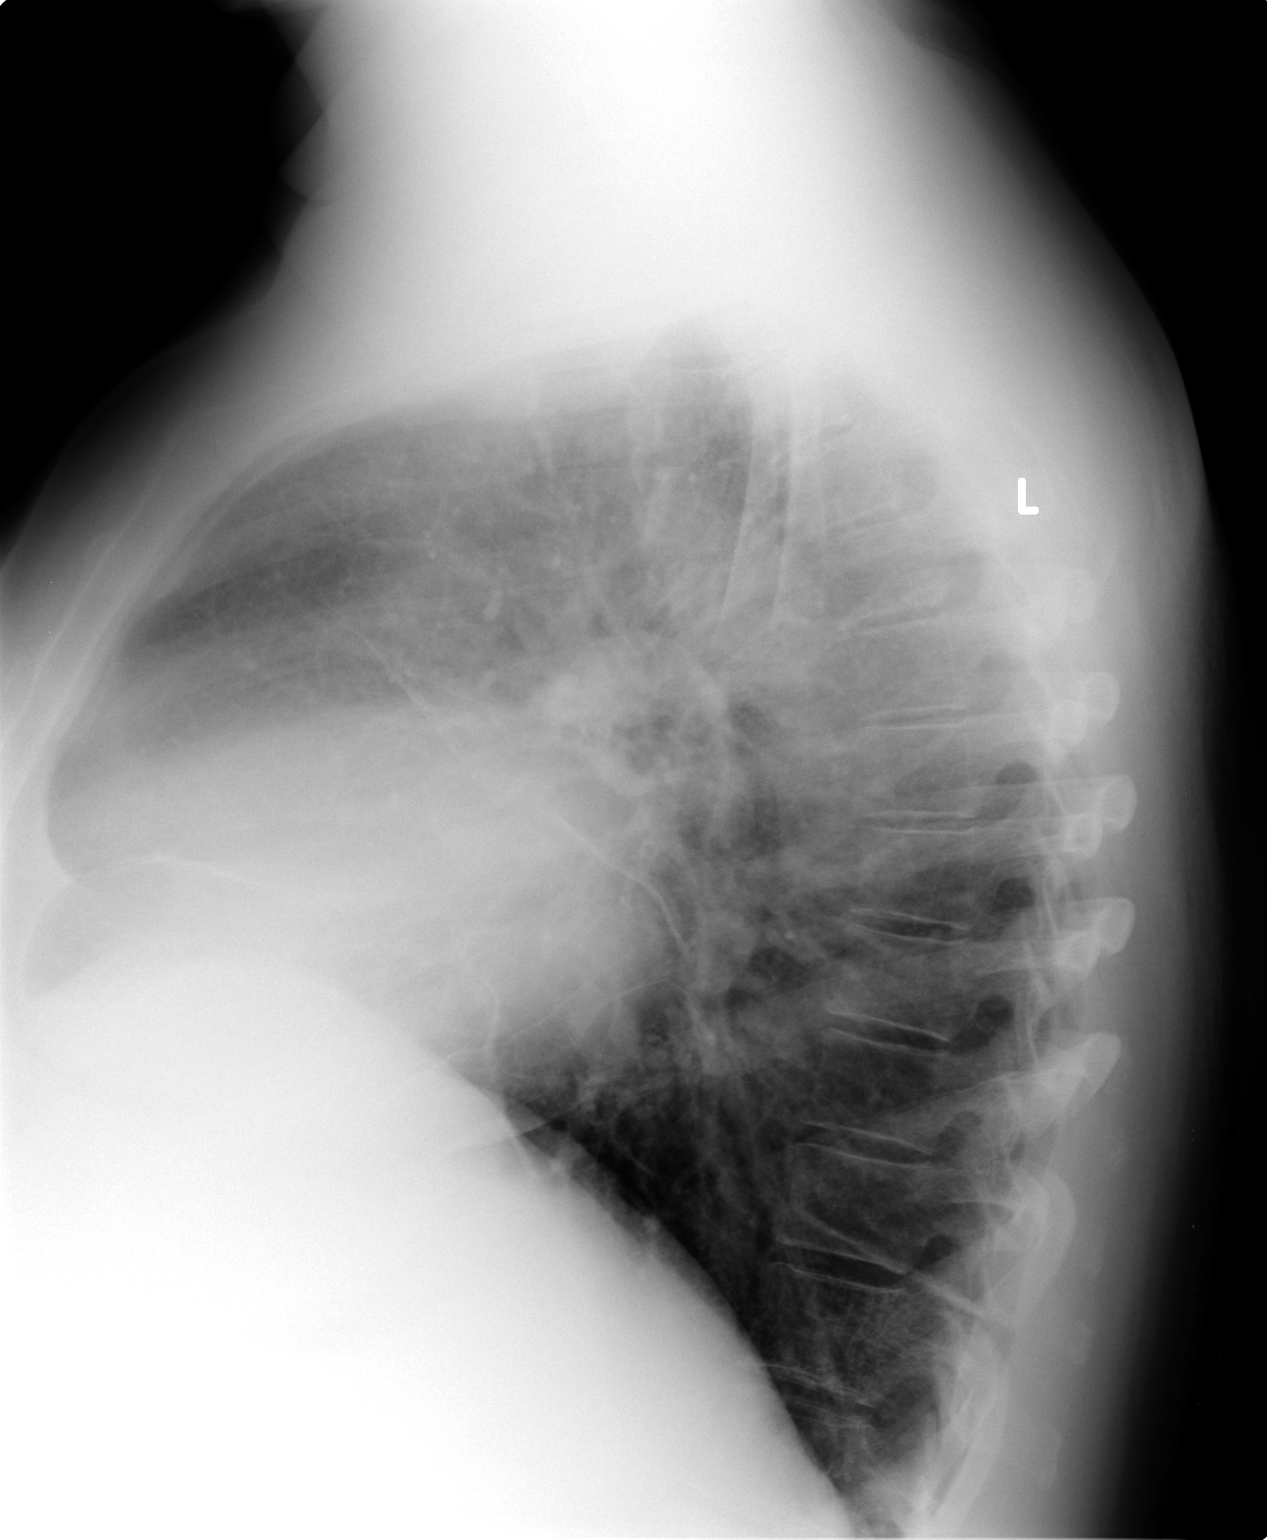

[2 of 2 positions shown; findings below may reference images not displayed]

FINDINGS: Mediastinum hilar structures are normal. Borderline cardiomegaly.
Mild pulmonary venous congestion cannot be excluded. Low lung
volumes with mild bibasilar atelectasis. No pleural effusion or
pneumothorax.
IMPRESSION: 1. Borderline cardiomegaly. Mild pulmonary vascular congestion
cannot be excluded.
2. Low lung volumes with mild bibasilar atelectasis.

## 2017-06-05 NOTE — Progress Notes (Signed)
    AV Optimization Echo Note Date: 06/05/2017  ID:  Mcarthur Rossetti Mcclaskey, DOB [birth year], MRN [id number]  PCP: Lupita Raider, MD Primary Cardiologist: Bensimhon Electrophysiologist: Irven Easterly Abraha is a 51 y.o. male is seen today for Dr Graciela Husbands.  He presents today for AV optimization of CRT.  Since last being seen in our clinic, the patient reports stable HF symptoms.    The patient is predominately atrially sensed.  Device was programmed with a  PAV of 180 and SAV of 160 on presentation.  Sensed AV delays were evaluated from 100 msec to 180 msec.  Optimal separation of E/A waves was noted at 110 msec.    LV timing was evaluated using VTI.  VTI optimal at LV delay of .  Device was reprogrammed today to a SAV of and a PAV of .    The patient will return to see me or Dr Graciela Husbands in 3 months.  Would update echo in 4-6 months.   Signed, Gypsy Balsam, NP  06/05/2017 7:39 PM     [facility] [address] [place] [address] [phone] (office) [phone] (fax)

## 2017-06-09 ENCOUNTER — Ambulatory Visit (INDEPENDENT_AMBULATORY_CARE_PROVIDER_SITE_OTHER): Payer: BC Managed Care – PPO | Admitting: Nurse Practitioner

## 2017-06-09 ENCOUNTER — Other Ambulatory Visit: Payer: Self-pay | Admitting: Nurse Practitioner

## 2017-06-09 ENCOUNTER — Other Ambulatory Visit: Payer: Self-pay

## 2017-06-09 ENCOUNTER — Ambulatory Visit (HOSPITAL_COMMUNITY): Payer: BC Managed Care – PPO | Attending: Internal Medicine

## 2017-06-09 DIAGNOSIS — Z8249 Family history of ischemic heart disease and other diseases of the circulatory system: Secondary | ICD-10-CM | POA: Insufficient documentation

## 2017-06-09 DIAGNOSIS — I5022 Chronic systolic (congestive) heart failure: Secondary | ICD-10-CM

## 2017-06-09 DIAGNOSIS — I447 Left bundle-branch block, unspecified: Secondary | ICD-10-CM | POA: Insufficient documentation

## 2017-06-09 DIAGNOSIS — I110 Hypertensive heart disease with heart failure: Secondary | ICD-10-CM | POA: Diagnosis not present

## 2017-06-09 DIAGNOSIS — E785 Hyperlipidemia, unspecified: Secondary | ICD-10-CM | POA: Diagnosis not present

## 2017-06-09 DIAGNOSIS — I429 Cardiomyopathy, unspecified: Secondary | ICD-10-CM | POA: Diagnosis not present

## 2017-06-09 DIAGNOSIS — E669 Obesity, unspecified: Secondary | ICD-10-CM | POA: Insufficient documentation

## 2017-06-09 DIAGNOSIS — Z6841 Body Mass Index (BMI) 40.0 and over, adult: Secondary | ICD-10-CM | POA: Insufficient documentation

## 2017-06-20 ENCOUNTER — Encounter (HOSPITAL_COMMUNITY): Payer: Self-pay | Admitting: Cardiology

## 2017-06-28 ENCOUNTER — Other Ambulatory Visit (HOSPITAL_COMMUNITY): Payer: Self-pay | Admitting: Internal Medicine

## 2017-06-30 ENCOUNTER — Ambulatory Visit (INDEPENDENT_AMBULATORY_CARE_PROVIDER_SITE_OTHER): Payer: BC Managed Care – PPO | Admitting: *Deleted

## 2017-06-30 ENCOUNTER — Telehealth: Payer: Self-pay | Admitting: Cardiology

## 2017-06-30 DIAGNOSIS — I428 Other cardiomyopathies: Secondary | ICD-10-CM

## 2017-06-30 NOTE — Telephone Encounter (Signed)
LMOVM reminding pt to send remote transmission.   

## 2017-07-01 NOTE — Telephone Encounter (Signed)
Manual transmission successfully received. 

## 2017-07-01 NOTE — Telephone Encounter (Addendum)
Advised patient that Merlin transmission was not received automatically--appears that it was not scheduled in [place], just in Epic.  Walked patient through manual transmission steps and he will try to send when he gets home today.  Advised I will call him back this afternoon if transmission is not received.  Patient also has questions about test results from HF Clinic.  He states he tried to call back and left a message, but has not received a return call.  He received a letter in the mail regarding results recently.  Patient requests a call back.  Advised patient I will forward message for further assistance.  He is Adult nurse.

## 2017-07-05 NOTE — Progress Notes (Signed)
Remote ICD transmission.   

## 2017-07-07 ENCOUNTER — Encounter: Payer: Self-pay | Admitting: Cardiology

## 2017-07-12 LAB — CUP PACEART REMOTE DEVICE CHECK
Battery Remaining Longevity: 64 mo
Battery Remaining Percentage: 75 %
Battery Voltage: 2.95 V
Brady Statistic AP VP Percent: 1 %
Brady Statistic AP VS Percent: 0 %
Brady Statistic AS VP Percent: 99 %
Brady Statistic AS VS Percent: 1 %
Brady Statistic RA Percent Paced: 1 %
Date Time Interrogation Session: 20180720154614
HighPow Impedance: 84 Ohm
HighPow Impedance: 84 Ohm
Implantable Lead Implant Date: 20170426
Implantable Lead Implant Date: 20170426
Implantable Lead Implant Date: 20170426
Implantable Lead Location: 753858
Implantable Lead Location: 753859
Implantable Lead Location: 753860
Implantable Lead Model: 293
Implantable Lead Serial Number: 385378
Implantable Pulse Generator Implant Date: 20170426
Lead Channel Impedance Value: 430 Ohm
Lead Channel Impedance Value: 530 Ohm
Lead Channel Impedance Value: 680 Ohm
Lead Channel Pacing Threshold Amplitude: 0.75 V
Lead Channel Pacing Threshold Amplitude: 0.75 V
Lead Channel Pacing Threshold Amplitude: 1.375 V
Lead Channel Pacing Threshold Pulse Width: 0.4 ms
Lead Channel Pacing Threshold Pulse Width: 0.4 ms
Lead Channel Pacing Threshold Pulse Width: 0.8 ms
Lead Channel Sensing Intrinsic Amplitude: 4.2 mV
Lead Channel Sensing Intrinsic Amplitude: 7.8 mV
Lead Channel Setting Pacing Amplitude: 1.25 V
Lead Channel Setting Pacing Amplitude: 2.375
Lead Channel Setting Pacing Amplitude: 2.5 V
Lead Channel Setting Pacing Pulse Width: 0.4 ms
Lead Channel Setting Pacing Pulse Width: 0.8 ms
Lead Channel Setting Sensing Sensitivity: 0.5 mV
Pulse Gen Serial Number: 7307210

## 2017-07-14 ENCOUNTER — Encounter (HOSPITAL_COMMUNITY): Payer: Self-pay | Admitting: Cardiology

## 2017-07-14 NOTE — Telephone Encounter (Signed)
Unable to reach by phone, letter mailed.

## 2017-09-02 NOTE — Progress Notes (Deleted)
Electrophysiology Office Note Date: 09/02/2017  ID:  Travis Cline, DOB 1966/06/05, MRN [id number]  PCP: Lupita Raider, MD Primary Cardiologist: Bensimhon Electrophysiologist: Graciela Husbands  CC: Routine ICD follow-up  Travis Cline is a 51 y.o. male seen today for Dr Graciela Husbands.  He presents today for routine electrophysiology followup.  Since last being seen in our clinic, the patient reports doing very well. He denies chest pain, palpitations, dyspnea, PND, orthopnea, nausea, vomiting, dizziness, syncope, edema, weight gain, or early satiety.  He has not had ICD shocks.   Device History: STJ CRTD implanted 2017 for NICM, CHF History of appropriate therapy: No History of AAD therapy: No   Past Medical History:  Diagnosis Date  . Asthma   . Cardiomyopathy (HCC)    a.  06/2015  echo: EF 15%, diffuse hypokinesis, anteroseptal/apical akinesis. Mild mitral regurgitation. Severely dilated left atrium. Mildly reduced RV function. ulnar artery systolic pressure 54 mmHg.  . CHF (congestive heart failure) (HCC)   . Erectile dysfunction   . Essential hypertension   . GERD (gastroesophageal reflux disease)   . Gout   . Hyperlipemia   . Insomnia   . LBBB (left bundle branch block)   . Morbid obesity (HCC)    Past Surgical History:  Procedure Laterality Date  . CARDIAC CATHETERIZATION N/A 07/21/2015   Procedure: Left Heart Cath and Coronary Angiography;  Surgeon: Lyn Records, MD;  Location: [facility] INVASIVE CV LAB;  Service: Cardiovascular;  Laterality: N/A;  . EP IMPLANTABLE DEVICE  04/07/2016   bvi  . EP IMPLANTABLE DEVICE N/A 04/07/2016   Procedure: BiV ICD Insertion CRT-D;  Surgeon: Duke Salvia, MD;  Location: [facility] INVASIVE CV LAB;  Service: Cardiovascular;  Laterality: N/A;  . HERNIA REPAIR    . KNEE ARTHROSCOPY Right 11/17/2016   Procedure: RIGHT ARTHROSCOPY KNEE WITH MEDIAL MENISECTOMY;  Surgeon: Gean Birchwood, MD;  Location: [facility] OR;  Service: Orthopedics;  Laterality: Right;  . STERIOD  INJECTION Left 11/17/2016   Procedure: STEROID INJECTION left knee;  Surgeon: Gean Birchwood, MD;  Location: [facility] OR;  Service: Orthopedics;  Laterality: Left;    Current Outpatient Prescriptions  Medication Sig Dispense Refill  . albuterol (PROVENTIL HFA;VENTOLIN HFA) 108 (90 BASE) MCG/ACT inhaler Inhale 2 puffs into the lungs every 4 (four) hours as needed for wheezing or shortness of breath. Reported on 04/23/2016    . amLODipine (NORVASC) 10 MG tablet Take 1 tablet (10 mg total) by mouth daily. 90 tablet 3  . aspirin EC 81 MG tablet Take 1 tablet (81 mg total) by mouth daily.    . carvedilol (COREG) 25 MG tablet TAKE 1 TABLET (25 MG TOTAL) BY MOUTH 2 (TWO) TIMES DAILY. 180 tablet 3  . cimetidine (TAGAMET) 200 MG tablet Take 200 mg by mouth 2 (two) times daily as needed. Reported on 02/24/2016    . colchicine 0.6 MG tablet Take 1 tablet (0.6 mg total) by mouth daily as needed. Reported on 04/23/2016 60 tablet 2  . ENTRESTO 97-103 MG TAKE 1 TABLET BY MOUTH 2 (TWO) TIMES DAILY. 180 tablet 3  . Fluticasone-Salmeterol (ADVAIR) 250-50 MCG/DOSE AEPB Inhale 1 puff into the lungs every 12 (twelve) hours. 60 each 11  . furosemide (LASIX) 40 MG tablet Take 2 tablets (80 mg total) by mouth daily. 180 tablet 3  . HYDROcodone-acetaminophen (NORCO) 5-325 MG tablet Take 1 tablet by mouth every 6 (six) hours as needed. 60 tablet 0  . meloxicam (MOBIC) 15 MG tablet Take 15 mg by mouth  daily. with food  2  . Multiple Vitamins-Minerals (MULTIVITAMIN WITH MINERALS) tablet Take 1 tablet by mouth daily.    Marland Kitchen NITROSTAT 0.4 MG SL tablet PLACE 1 TABLET (0.4 MG TOTAL) UNDER THE TONGUE EVERY 5 (FIVE) MINUTES AS NEEDED. 25 tablet 1  . simvastatin (ZOCOR) 20 MG tablet TAKE 1 TABLET (20 MG TOTAL) BY MOUTH DAILY. 90 tablet 3  . spironolactone (ALDACTONE) 25 MG tablet Take 0.5 tablets (12.5 mg total) by mouth daily. 90 tablet 3  . ULORIC 40 MG tablet Take 80 mg by mouth daily.   3   No current facility-administered medications for  this visit.     Allergies:   No known allergies   Social History: Social History   Social History  . Marital status: Married    Spouse name: N/A  . Number of children: N/A  . Years of education: N/A   Occupational History  . Not on file.   Social History Main Topics  . Smoking status: Never Smoker  . Smokeless tobacco: Never Used  . Alcohol use 0.6 - 1.2 oz/week    1 - 2 Standard drinks or equivalent per week     Comment: was previously drinking 2-3 beers/day - currently drinking about 4 beers/month.  . Drug use: No  . Sexual activity: Yes   Other Topics Concern  . Not on file   Social History Narrative    He lives in [place] with his wife and 2 stepchildren. He has 2 children of his own who live in  [place]. He does not routinely exercise. 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    Family History: Family History  Problem Relation Age of Onset  . Other Father        alive & well @ 66.  . Arthritis Father   . Other Mother        alive & well @ 22.  Marland Kitchen Heart disease Mother   . Other Unknown        siblings alive and well.    Review of Systems: All other systems reviewed and are otherwise negative except as noted above.   Physical Exam: VS:  There were no vitals taken for this visit. , BMI There is no height or weight on file to calculate BMI.  GEN- The patient is obese appearing, alert and oriented x 3 today.   HEENT: normocephalic, atraumatic; sclera clear, conjunctiva pink; hearing intact; oropharynx clear; neck supple  Lungs- Clear to ausculation bilaterally, normal work of breathing.  No wheezes, rales, rhonchi Heart- Regular rate and rhythm, no murmurs, rubs or gallops  GI- soft, non-tender, non-distended, bowel sounds present  Extremities- no clubbing, cyanosis, or edema  MS- no significant deformity or atrophy Skin- warm and dry, no rash or lesion; ICD pocket well healed Psych-  euthymic mood, full affect Neuro- strength and sensation are intact  ICD interrogation- reviewed in detail today,  See PACEART report  EKG:  EKG is not ordered today.   Recent Labs: 11/17/2016: ALT 42; Hemoglobin 16.5; Platelets 183 03/21/2017: BUN 22; Creatinine, Ser 0.99; Potassium 4.8; Sodium 133   Wt Readings from Last 3 Encounters:  03/31/17 294 lb (133.4 kg)  02/23/17 291 lb (132 kg)  11/17/16 280 lb (127 kg)     Other studies Reviewed: Additional studies/ records that were reviewed today include: Dr Graciela Husbands and AHF notes   Assessment and Plan:  1.  Chronic  systolic dysfunction euvolemic today Stable on an appropriate medical regimen Normal ICD function See Pace Art report No changes today Post AV opt echo 05/2017 - update echo to see if improvement in EF 12/18  2.  HTN Stable No change required today   Current medicines are reviewed at length with the patient today.   The patient does not have concerns regarding his medicines.  The following changes were made today:  none  Labs/ tests ordered today include: echo 12/18 No orders of the defined types were placed in this encounter.    Disposition:   Follow up with Arsenio Katz, ICM clinic, Dr Graciela Husbands 6 months, AHF clinic as scheduled    Signed, Gypsy Balsam, NP 09/02/2017 9:44 AM  [facility] [address] [place] [address] [phone] (office) [phone] (fax)

## 2017-09-07 ENCOUNTER — Encounter: Payer: BC Managed Care – PPO | Admitting: Nurse Practitioner

## 2017-09-29 ENCOUNTER — Telehealth: Payer: Self-pay | Admitting: Cardiology

## 2017-09-29 ENCOUNTER — Ambulatory Visit (INDEPENDENT_AMBULATORY_CARE_PROVIDER_SITE_OTHER): Payer: BC Managed Care – PPO | Admitting: *Deleted

## 2017-09-29 DIAGNOSIS — I428 Other cardiomyopathies: Secondary | ICD-10-CM

## 2017-09-29 NOTE — Progress Notes (Signed)
Remote ICD transmission.   

## 2017-09-29 NOTE — Telephone Encounter (Signed)
Spoke with pt and reminded pt of remote transmission that is due today. Pt verbalized understanding.   

## 2017-10-04 ENCOUNTER — Encounter: Payer: Self-pay | Admitting: Internal Medicine

## 2017-10-04 ENCOUNTER — Ambulatory Visit (INDEPENDENT_AMBULATORY_CARE_PROVIDER_SITE_OTHER): Payer: BC Managed Care – PPO | Admitting: Internal Medicine

## 2017-10-04 VITALS — BP 108/86 | HR 96 | Ht 70.0 in | Wt 282.8 lb

## 2017-10-04 DIAGNOSIS — I447 Left bundle-branch block, unspecified: Secondary | ICD-10-CM

## 2017-10-04 DIAGNOSIS — Z23 Encounter for immunization: Secondary | ICD-10-CM | POA: Diagnosis not present

## 2017-10-04 DIAGNOSIS — I5022 Chronic systolic (congestive) heart failure: Secondary | ICD-10-CM | POA: Diagnosis not present

## 2017-10-04 DIAGNOSIS — I428 Other cardiomyopathies: Secondary | ICD-10-CM | POA: Diagnosis not present

## 2017-10-04 DIAGNOSIS — Z95810 Presence of automatic (implantable) cardiac defibrillator: Secondary | ICD-10-CM

## 2017-10-04 MED ORDER — AMLODIPINE BESYLATE 5 MG PO TABS: 5.0000 mg | ORAL_TABLET | Freq: Every day | ORAL | 3 refills | Status: DC

## 2017-10-04 MED ORDER — EPLERENONE 25 MG PO TABS: 25.0000 mg | ORAL_TABLET | Freq: Every day | ORAL | 3 refills | Status: DC

## 2017-10-04 NOTE — Progress Notes (Signed)
Patient Care Team: Lupita Raider, MD as PCP - General (Family Medicine)   HPI  Travis Cline is a 51 y.o. 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  He is much improved. He is able now to climb a flight of stairs.  No chest pain or edema  Breast tenderness;  L shoulder soreness   DATE TEST    12/16    Echo   EF 20-25 %   6/18    Echo   EF 30-35 %         Date Cr K  4/18 0.99 4.8         Past Medical History:  Diagnosis Date  . Asthma   . Cardiomyopathy (HCC)    a.  06/2015  echo: EF 15%, diffuse hypokinesis, anteroseptal/apical akinesis. Mild mitral regurgitation. Severely dilated left atrium. Mildly reduced RV function. ulnar artery systolic pressure 54 mmHg.  . CHF (congestive heart failure) (HCC)   . Erectile dysfunction   . Essential hypertension   . GERD (gastroesophageal reflux disease)   . Gout   . Hyperlipemia   . Insomnia   . LBBB (left bundle branch block)   . Morbid obesity (HCC)     Past Surgical History:  Procedure Laterality Date  . CARDIAC CATHETERIZATION N/A 07/21/2015   Procedure: Left Heart Cath and Coronary Angiography;  Surgeon: Lyn Records, MD;  Location: [facility] INVASIVE CV LAB;  Service: Cardiovascular;  Laterality: N/A;  . EP IMPLANTABLE DEVICE  04/07/2016   bvi  . EP IMPLANTABLE DEVICE N/A 04/07/2016   Procedure: BiV ICD Insertion CRT-D;  Surgeon: Duke Salvia, MD;  Location: [facility] INVASIVE CV LAB;  Service: Cardiovascular;  Laterality: N/A;  . HERNIA REPAIR    . KNEE ARTHROSCOPY Right 11/17/2016   Procedure: RIGHT ARTHROSCOPY KNEE WITH MEDIAL MENISECTOMY;  Surgeon: Gean Birchwood, MD;  Location: [facility] OR;  Service: Orthopedics;  Laterality: Right;  . STERIOD INJECTION Left 11/17/2016   Procedure: STEROID INJECTION left knee;  Surgeon: Gean Birchwood, MD;  Location: [facility] OR;  Service: Orthopedics;  Laterality: Left;    Current Outpatient Prescriptions    Medication Sig Dispense Refill  . nitroGLYCERIN (NITROSTAT) 0.4 MG SL tablet Place 0.4 mg under the tongue every 5 (five) minutes as needed for chest pain.    Marland Kitchen amLODipine (NORVASC) 10 MG tablet Take 1 tablet (10 mg total) by mouth daily. 90 tablet 3  . aspirin EC 81 MG tablet Take 1 tablet (81 mg total) by mouth daily.    . carvedilol (COREG) 25 MG tablet TAKE 1 TABLET (25 MG TOTAL) BY MOUTH 2 (TWO) TIMES DAILY. 180 tablet 3  . cimetidine (TAGAMET) 200 MG tablet Take 200 mg by mouth 2 (two) times daily as needed. Reported on 02/24/2016    . colchicine 0.6 MG tablet Take 1 tablet (0.6 mg total) by mouth daily as needed. Reported on 04/23/2016 60 tablet 2  . ENTRESTO 97-103 MG TAKE 1 TABLET BY MOUTH 2 (TWO) TIMES DAILY. 180 tablet 3  . furosemide (LASIX) 40 MG tablet Take 2 tablets (80 mg total) by mouth daily. 180 tablet 3  . Multiple Vitamins-Minerals (MULTIVITAMIN WITH MINERALS) tablet Take 1 tablet by mouth daily.    . simvastatin (ZOCOR) 20 MG tablet TAKE 1 TABLET (20 MG TOTAL) BY MOUTH DAILY. 90 tablet 3  . spironolactone (ALDACTONE) 25 MG tablet Take 0.5 tablets (12.5 mg total) by  mouth daily. 90 tablet 3  . ULORIC 40 MG tablet Take 80 mg by mouth daily.   3   No current facility-administered medications for this visit.     Allergies  Allergen Reactions  . No Known Allergies       Review of Systems negative except from HPI and PMH  Physical Exam BP 108/86   Pulse 96   Ht 5\' 10"  (1.778 m)   Wt 282 lb 12.8 oz (128.3 kg)   BMI 40.58 kg/m  Well developed and nourished in no acute distress HENT normal Neck supple with JVP-flat Clear Device pocket well healed; without hematoma or erythema.  There is no tethering  Regular rate and rhythm, no murmurs or gallops Abd-soft with active BS No Clubbing cyanosis edema Skin-warm and dry A & Oriented  Grossly normal sensory and motor function   ECG demonstrates P synchronous pacing 96 14/14/43 QRS negative lead lead 1 positive  V1  Assessment and  Plan  NICM  LBBB  Fam Hx of LBBB/Cardiomyopathy  Morbidly obese\  Sleep disordered breathing  Congestive heart failure-chronic-systolic heart grade 2  Hypotension   Gynecomastia  Will change spironolcactone to epleronone  Will decrease amlodipine from 10>>5  Will check surveillance labs  No device changes   Euvolemic continue current meds

## 2017-10-04 NOTE — Patient Instructions (Addendum)
Medication Instructions: Your physician has recommended you make the following change in your medication:  -1) STOP Aldactone (Spironolactone) -2) DECREASE Amlodipine (Norvasc) 5 mg - Take 1 tablet (5 mg) by mouth daily - New RX sent to Pharmacy -3) START Eplerenone 25 mg - Take 1 tablet (25 mg) by mouth daily - New RX sent to Pharmacy  Labwork: Your physician recommends that you have lab work today: BMET  Procedures/Testing: None Ordered  Follow-Up: Remote monitoring is used to monitor your ICD from home. This monitoring reduces the number of office visits required to check your device to one time per year. It allows [place] to keep an eye on the functioning of your device to ensure it is working properly. You are scheduled for a device check from home on 12/29/17. You may send your transmission at any time that day. If you have a wireless device, the transmission will be sent automatically. After your physician reviews your transmission, you will receive a postcard with your next transmission date.  Your physician recommends that you schedule a follow-up appointment in January 2019 with Dr. Jones Broom   Your physician wants you to follow-up in: 1 YEAR with Francis Dowse, PA-C. You will receive a reminder letter in the mail two months in advance. If you don't receive a letter, please call our office to schedule the follow-up appointment.   If you need a refill on your cardiac medications before your next appointment, please call your pharmacy.

## 2017-10-05 LAB — BASIC METABOLIC PANEL
BUN / CREAT RATIO: 17 (ref 9–20)
BUN: 19 mg/dL (ref 6–24)
CO2: 24 mmol/L (ref 20–29)
CREATININE: 1.15 mg/dL (ref 0.76–1.27)
Calcium: 10.4 mg/dL — ABNORMAL HIGH (ref 8.7–10.2)
Chloride: 102 mmol/L (ref 96–106)
GFR calc non Af Amer: 73 mL/min/{1.73_m2} (ref >59)
GFR, EST AFRICAN AMERICAN: 85 mL/min/{1.73_m2} (ref >59)
Glucose: 109 mg/dL — ABNORMAL HIGH (ref 65–99)
Potassium: 5 mmol/L (ref 3.5–5.2)
SODIUM: 143 mmol/L (ref 134–144)

## 2017-10-06 ENCOUNTER — Encounter: Payer: Self-pay | Admitting: Cardiology

## 2017-10-10 ENCOUNTER — Telehealth: Payer: Self-pay

## 2017-10-10 NOTE — Telephone Encounter (Signed)
Pt is aware and agreeable to normal results with elevated blood sugar. He says that Dr. Clelia Croft (PCP) is aware and following elevated blood sugar. We are both agreeable

## 2017-10-15 LAB — CUP PACEART INCLINIC DEVICE CHECK
Brady Statistic RV Percent Paced: 99.49 %
Date Time Interrogation Session: 20181023183925
HighPow Impedance: 85.5 Ohm
Implantable Lead Implant Date: 20170426
Implantable Lead Implant Date: 20170426
Implantable Lead Location: 753859
Implantable Lead Location: 753860
Implantable Lead Serial Number: 385378
Implantable Pulse Generator Implant Date: 20170426
Lead Channel Impedance Value: 675 Ohm
Lead Channel Pacing Threshold Amplitude: 0.5 V
Lead Channel Pacing Threshold Amplitude: 1 V
Lead Channel Pacing Threshold Pulse Width: 0.4 ms
Lead Channel Pacing Threshold Pulse Width: 0.8 ms
Lead Channel Setting Pacing Amplitude: 1.25 V
Lead Channel Setting Pacing Amplitude: 2.5 V
Lead Channel Setting Pacing Pulse Width: 0.8 ms
Lead Channel Setting Sensing Sensitivity: 0.5 mV
MDC IDC LEAD IMPLANT DT: 20170426
MDC IDC LEAD LOCATION: 753858
MDC IDC MSMT BATTERY REMAINING LONGEVITY: 58 mo
MDC IDC MSMT LEADCHNL LV PACING THRESHOLD AMPLITUDE: 0.75 V
MDC IDC MSMT LEADCHNL LV PACING THRESHOLD AMPLITUDE: 0.75 V
MDC IDC MSMT LEADCHNL LV PACING THRESHOLD PULSEWIDTH: 0.8 ms
MDC IDC MSMT LEADCHNL RA IMPEDANCE VALUE: 525 Ohm
MDC IDC MSMT LEADCHNL RA PACING THRESHOLD PULSEWIDTH: 0.4 ms
MDC IDC MSMT LEADCHNL RA SENSING INTR AMPL: 4.2 mV
MDC IDC MSMT LEADCHNL RV IMPEDANCE VALUE: 437.5 Ohm
MDC IDC MSMT LEADCHNL RV SENSING INTR AMPL: 12 mV
MDC IDC SET LEADCHNL RA PACING AMPLITUDE: 2.125
MDC IDC SET LEADCHNL RV PACING PULSEWIDTH: 0.4 ms
MDC IDC STAT BRADY RA PERCENT PACED: 1.1 %
Pulse Gen Serial Number: 7307210

## 2017-10-21 LAB — CUP PACEART REMOTE DEVICE CHECK
Battery Remaining Longevity: 62 mo
Battery Remaining Percentage: 72 %
Brady Statistic AP VP Percent: 1.4 %
Brady Statistic AP VS Percent: 1 %
Brady Statistic AS VS Percent: 1 %
Date Time Interrogation Session: 20181018144041
HighPow Impedance: 90 Ohm
HighPow Impedance: 90 Ohm
Implantable Lead Implant Date: 20170426
Implantable Lead Implant Date: 20170426
Implantable Lead Location: 753859
Implantable Lead Model: 293
Implantable Lead Serial Number: 385378
Implantable Pulse Generator Implant Date: 20170426
Lead Channel Impedance Value: 540 Ohm
Lead Channel Pacing Threshold Amplitude: 0.75 V
Lead Channel Pacing Threshold Amplitude: 1 V
Lead Channel Pacing Threshold Pulse Width: 0.4 ms
Lead Channel Pacing Threshold Pulse Width: 0.8 ms
Lead Channel Sensing Intrinsic Amplitude: 3.5 mV
Lead Channel Sensing Intrinsic Amplitude: 8.4 mV
Lead Channel Setting Pacing Amplitude: 2 V
Lead Channel Setting Pacing Amplitude: 2.5 V
Lead Channel Setting Pacing Pulse Width: 0.4 ms
Lead Channel Setting Pacing Pulse Width: 0.8 ms
MDC IDC LEAD IMPLANT DT: 20170426
MDC IDC LEAD LOCATION: 753858
MDC IDC LEAD LOCATION: 753860
MDC IDC MSMT BATTERY VOLTAGE: 2.93 V
MDC IDC MSMT LEADCHNL LV IMPEDANCE VALUE: 680 Ohm
MDC IDC MSMT LEADCHNL RA PACING THRESHOLD PULSEWIDTH: 0.4 ms
MDC IDC MSMT LEADCHNL RV IMPEDANCE VALUE: 460 Ohm
MDC IDC MSMT LEADCHNL RV PACING THRESHOLD AMPLITUDE: 0.75 V
MDC IDC PG SERIAL: 7307210
MDC IDC SET LEADCHNL LV PACING AMPLITUDE: 1.25 V
MDC IDC SET LEADCHNL RV SENSING SENSITIVITY: 0.5 mV
MDC IDC STAT BRADY AS VP PERCENT: 98 %
MDC IDC STAT BRADY RA PERCENT PACED: 1.2 %

## 2017-12-22 ENCOUNTER — Ambulatory Visit (HOSPITAL_COMMUNITY)
Admission: RE | Admit: 2017-12-22 | Discharge: 2017-12-22 | Disposition: A | Discharge status: Home / Self Care | Payer: BC Managed Care – PPO | Source: Ambulatory Visit | Attending: Internal Medicine | Admitting: Internal Medicine

## 2017-12-22 ENCOUNTER — Encounter (HOSPITAL_COMMUNITY): Payer: Self-pay | Admitting: Internal Medicine

## 2017-12-22 ENCOUNTER — Other Ambulatory Visit: Payer: Self-pay

## 2017-12-22 VITALS — BP 122/80 | HR 78 | Wt 289.2 lb

## 2017-12-22 DIAGNOSIS — Z95810 Presence of automatic (implantable) cardiac defibrillator: Secondary | ICD-10-CM | POA: Diagnosis not present

## 2017-12-22 DIAGNOSIS — I428 Other cardiomyopathies: Secondary | ICD-10-CM

## 2017-12-22 DIAGNOSIS — I447 Left bundle-branch block, unspecified: Secondary | ICD-10-CM | POA: Diagnosis not present

## 2017-12-22 DIAGNOSIS — I110 Hypertensive heart disease with heart failure: Secondary | ICD-10-CM | POA: Insufficient documentation

## 2017-12-22 DIAGNOSIS — I5022 Chronic systolic (congestive) heart failure: Secondary | ICD-10-CM | POA: Diagnosis not present

## 2017-12-22 DIAGNOSIS — Z7982 Long term (current) use of aspirin: Secondary | ICD-10-CM | POA: Insufficient documentation

## 2017-12-22 DIAGNOSIS — I429 Cardiomyopathy, unspecified: Secondary | ICD-10-CM | POA: Insufficient documentation

## 2017-12-22 DIAGNOSIS — K219 Gastro-esophageal reflux disease without esophagitis: Secondary | ICD-10-CM | POA: Diagnosis not present

## 2017-12-22 DIAGNOSIS — I2510 Atherosclerotic heart disease of native coronary artery without angina pectoris: Secondary | ICD-10-CM | POA: Diagnosis not present

## 2017-12-22 DIAGNOSIS — E785 Hyperlipidemia, unspecified: Secondary | ICD-10-CM | POA: Insufficient documentation

## 2017-12-22 DIAGNOSIS — J45909 Unspecified asthma, uncomplicated: Secondary | ICD-10-CM | POA: Diagnosis not present

## 2017-12-22 DIAGNOSIS — M109 Gout, unspecified: Secondary | ICD-10-CM | POA: Insufficient documentation

## 2017-12-22 DIAGNOSIS — Z79899 Other long term (current) drug therapy: Secondary | ICD-10-CM | POA: Insufficient documentation

## 2017-12-22 DIAGNOSIS — I10 Essential (primary) hypertension: Secondary | ICD-10-CM | POA: Diagnosis not present

## 2017-12-22 LAB — BASIC METABOLIC PANEL
Anion gap: 12 (ref 5–15)
BUN: 17 mg/dL (ref 6–20)
CHLORIDE: 103 mmol/L (ref 101–111)
CO2: 22 mmol/L (ref 22–32)
Calcium: 9.7 mg/dL (ref 8.9–10.3)
Creatinine, Ser: 0.91 mg/dL (ref 0.61–1.24)
GFR calc non Af Amer: >60 mL/min (ref >60)
GLUCOSE: 187 mg/dL — AB (ref 65–99)
POTASSIUM: 4.2 mmol/L (ref 3.5–5.1)
Sodium: 137 mmol/L (ref 135–145)

## 2017-12-22 MED ORDER — ASPIRIN EC 81 MG PO TBEC: 81.0000 mg | DELAYED_RELEASE_TABLET | Freq: Every day | ORAL | 3 refills | Status: AC

## 2017-12-22 MED ORDER — FUROSEMIDE 40 MG PO TABS: 80.0000 mg | ORAL_TABLET | Freq: Every day | ORAL | 3 refills | Status: DC

## 2017-12-22 NOTE — Patient Instructions (Signed)
Labs today  We will contact you in 1 year to schedule your next appointment.  

## 2017-12-22 NOTE — Progress Notes (Signed)
ADVANCED HF CLINIC NOTE  Patient ID: Travis Cline, male   DOB: November 22, 1966, 52 y.o.   MRN: [id number]  PCP: Dr Cam Hai  Primary Cardiologist: Dr Graciela Husbands   HPI: Travis Cline is a 27 year old with a history of HTN, hyperlipidemia, obesity, LBBB, nonobstructive CAD and recently diagnosed systolic HF due to NICM caridomyopathy with EF 15% diagnosed 8/16. Cath with non-obstructive CAD.   Underwent St Jude CRT-D implant on 04/07/16   Today he presents for HF follow up. Has not been seen for about 10 months. Feels good. . Feels better. 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 Mobility mostly limited by knee pain. Denies SOB, orthopnea or PND. Think BIV upgrade has helped with fatigue and reduced orthopnea.   Echo 12/16 EF ~25%. RV ok  Echo 6/18 30-35%    EKG 07/21/15 NSR 92 bpm LBBB QRS 170   LHC 8/6   Prox LAD lesion, 35% stenosed.  Prox Cx to Mid Cx lesion, 30% stenosed.  There is severe left ventricular systolic dysfunction  ECHO 07/2015 -->EF 15%. RV mildly dilated. Peak PA pressure 54 mm hg   CXR  07/15/15 shows borderline heart size, mild vasc congestion, low lung vols and basilar atx...   PFT 06/17/15 showed FVC=3.90 (75%), FEV1=2.88 (72%), %1sec=74, mid-flows reduced at 58% predicted; post-bronchodil showed an 8% improvement in FEV1; LungVols showed TLC=6.07 (86%), RV=1.97 (97%), DLCO=99%; These are c/w some mild obstruction/ small airways disease...  Labs 07/16/2015 K 4.5 Creatinine 0.95  Hgb 17.3  Labs 09/12/2015: K 5.3 Creatinine 1.01   Labs 10/28/2015: K 54.7 Creatinine 0.93  SH:  Social History   Socioeconomic History  . Marital status: Married    Spouse name: Not on file  . Number of children: Not on file  . Years of education: Not on file  . Highest education level: Not on file  Social Needs  . Financial resource strain: Not on file  . Food insecurity - worry: Not on file  . Food insecurity - inability: Not on file  . Transportation needs - medical: Not on  file  . Transportation needs - non-medical: Not on file  Occupational History  . Not on file  Tobacco Use  . Smoking status: Never Smoker  . Smokeless tobacco: Never Used  Substance and Sexual Activity  . Alcohol use: Yes    Alcohol/week: 0.6 - 1.2 oz    Types: 1 - 2 Standard drinks or equivalent per week    Comment: was previously drinking 2-3 beers/day - currently drinking about 4 beers/month.  . Drug use: No  . Sexual activity: Yes  Other Topics Concern  . Not on file  Social History Narrative    He lives in [place] with his wife and 2 stepchildren. He has 2 children of his own who live in  [place]. He does not routinely exercise. 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    FH:  Family History  Problem Relation Age of Onset  . Other Father        alive & well @ 57.  . Arthritis Father   . Other Mother        alive & well @ 84.  Marland Kitchen Heart disease Mother   . Other Unknown        siblings alive and well.    Past Medical History:  Diagnosis Date  . Asthma   . Cardiomyopathy (HCC)  a.  06/2015  echo: EF 15%, diffuse hypokinesis, anteroseptal/apical akinesis. Mild mitral regurgitation. Severely dilated left atrium. Mildly reduced RV function. ulnar artery systolic pressure 54 mmHg.  . CHF (congestive heart failure) (HCC)   . Erectile dysfunction   . Essential hypertension   . GERD (gastroesophageal reflux disease)   . Gout   . Hyperlipemia   . Insomnia   . LBBB (left bundle branch block)   . Morbid obesity (HCC)     Current Outpatient Medications  Medication Sig Dispense Refill  . amLODipine (NORVASC) 5 MG tablet Take 1 tablet (5 mg total) by mouth daily. 90 tablet 3  . aspirin EC 81 MG tablet Take 1 tablet (81 mg total) by mouth daily.    . carvedilol (COREG) 25 MG tablet TAKE 1 TABLET (25 MG TOTAL) BY MOUTH 2 (TWO) TIMES DAILY. 180 tablet 3  . cimetidine (TAGAMET) 200 MG tablet Take 200 mg by  mouth 2 (two) times daily as needed. Reported on 02/24/2016    . colchicine 0.6 MG tablet Take 1 tablet (0.6 mg total) by mouth daily as needed. Reported on 04/23/2016 60 tablet 2  . ENTRESTO 97-103 MG TAKE 1 TABLET BY MOUTH 2 (TWO) TIMES DAILY. 180 tablet 3  . eplerenone (INSPRA) 25 MG tablet Take 1 tablet (25 mg total) by mouth daily. 90 tablet 3  . furosemide (LASIX) 40 MG tablet Take 2 tablets (80 mg total) by mouth daily. 180 tablet 3  . Multiple Vitamins-Minerals (MULTIVITAMIN WITH MINERALS) tablet Take 1 tablet by mouth daily.    . nitroGLYCERIN (NITROSTAT) 0.4 MG SL tablet Place 0.4 mg under the tongue every 5 (five) minutes as needed for chest pain.    . simvastatin (ZOCOR) 20 MG tablet TAKE 1 TABLET (20 MG TOTAL) BY MOUTH DAILY. 90 tablet 3  . ULORIC 40 MG tablet Take 80 mg by mouth daily.   3   No current facility-administered medications for this encounter.     Vitals:   12/22/17 0936  BP: 122/80  Pulse: 78  SpO2: 99%  Weight: 289 lb 4 oz (131.2 kg)   Filed Weights   12/22/17 0936  Weight: 289 lb 4 oz (131.2 kg)    PHYSICAL EXAM:  General:  Well appearing. No resp difficulty HEENT: normal Neck: supple. no JVD. Carotids 2+ bilat; no bruits. No lymphadenopathy or thryomegaly appreciated. Cor: PMI nondisplaced. Regular rate & rhythm. No rubs, gallops or murmurs. Lungs: clear Abdomen: obese soft, nontender, nondistended. No hepatosplenomegaly. No bruits or masses. Good bowel sounds. Extremities: no cyanosis, clubbing, rash, edema Neuro: alert & orientedx3, cranial nerves grossly intact. moves all 4 extremities w/o difficulty. Affect pleasant   ASSESSMENT & PLAN:  1. Chronic Systolic HF NICM. Had LHC 8/8 with nonobstructive CAD.  S/p SJ CRT-D 4/17 - Now s/p CRT-D. - Doing well. NYHA II. Volume status looks good.  - ECHO 12/16 EF 20-25% Septum dyskinetic from LBBB RV mildly hypokinetic.  - Echo 6/18 EF 30-35% - Continue carvedilol 25 mg twice a day.  - Continue  entresto 97/103 bid - Continue eplerenone  25 mg daily. (had painful gynecomastia with spiro)  2. LBBB --s/p CTR-D.  3. HTN - Blood pressure well controlled. Continue current regimen. 4. Gout  - Continue current regimen per PCP 5. ETOH - Former heavy drinker. Rarely drinking alcohol.  6. Probable OSA - Said he checked with insurance and they wouldn't pay for it. Wife denies apnea.   Doing well s/p CRT. See back in 1  year with echo.   Arvilla Meres MD  9:51 AM

## 2017-12-29 ENCOUNTER — Ambulatory Visit (INDEPENDENT_AMBULATORY_CARE_PROVIDER_SITE_OTHER): Payer: BC Managed Care – PPO | Admitting: *Deleted

## 2017-12-29 DIAGNOSIS — I428 Other cardiomyopathies: Secondary | ICD-10-CM | POA: Diagnosis not present

## 2017-12-29 NOTE — Progress Notes (Signed)
Remote ICD transmission.   

## 2017-12-30 ENCOUNTER — Encounter: Payer: Self-pay | Admitting: Cardiology

## 2018-01-03 LAB — CUP PACEART REMOTE DEVICE CHECK
Brady Statistic AP VP Percent: 1 %
Brady Statistic AP VS Percent: 1 %
Brady Statistic AS VP Percent: 99 %
Brady Statistic AS VS Percent: 1 %
Date Time Interrogation Session: 20190117070016
HIGH POWER IMPEDANCE MEASURED VALUE: 83 Ohm
HighPow Impedance: 83 Ohm
Implantable Lead Implant Date: 20170426
Implantable Lead Implant Date: 20170426
Implantable Lead Location: 753860
Implantable Lead Serial Number: 385378
Lead Channel Impedance Value: 400 Ohm
Lead Channel Impedance Value: 690 Ohm
Lead Channel Pacing Threshold Amplitude: 0.5 V
Lead Channel Pacing Threshold Amplitude: 0.75 V
Lead Channel Pacing Threshold Amplitude: 1.25 V
Lead Channel Pacing Threshold Pulse Width: 0.4 ms
Lead Channel Pacing Threshold Pulse Width: 0.8 ms
Lead Channel Sensing Intrinsic Amplitude: 3 mV
Lead Channel Setting Pacing Amplitude: 2.5 V
Lead Channel Setting Pacing Pulse Width: 0.8 ms
MDC IDC LEAD IMPLANT DT: 20170426
MDC IDC LEAD LOCATION: 753858
MDC IDC LEAD LOCATION: 753859
MDC IDC MSMT BATTERY REMAINING LONGEVITY: 59 mo
MDC IDC MSMT BATTERY REMAINING PERCENTAGE: 69 %
MDC IDC MSMT BATTERY VOLTAGE: 2.93 V
MDC IDC MSMT LEADCHNL RA IMPEDANCE VALUE: 510 Ohm
MDC IDC MSMT LEADCHNL RA PACING THRESHOLD PULSEWIDTH: 0.4 ms
MDC IDC MSMT LEADCHNL RV SENSING INTR AMPL: 12 mV
MDC IDC PG IMPLANT DT: 20170426
MDC IDC SET LEADCHNL LV PACING AMPLITUDE: 1.25 V
MDC IDC SET LEADCHNL RA PACING AMPLITUDE: 2.25 V
MDC IDC SET LEADCHNL RV PACING PULSEWIDTH: 0.4 ms
MDC IDC SET LEADCHNL RV SENSING SENSITIVITY: 0.5 mV
MDC IDC STAT BRADY RA PERCENT PACED: 1 %
Pulse Gen Serial Number: 7307210

## 2018-02-21 ENCOUNTER — Telehealth: Payer: Self-pay | Admitting: Internal Medicine

## 2018-02-21 NOTE — Telephone Encounter (Signed)
°  1. Has your device fired? no  2. Is you device beeping? no  3. Are you experiencing draining or swelling at device site? no  4. Are you calling to see if we received your device transmission? no  5. Have you passed out? 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    Please route to Device Clinic Pool

## 2018-02-21 NOTE — Telephone Encounter (Signed)
Spoke with patient and encouraged him to take his ID card with him during travel. I explained that the new medal detectors that have you stand inside while it rotates around you are safe to use. I explained that if these are not available then he should request that a wand be used in a consistent motion over the ICD without hovering. Patient verbalized understanding.

## 2018-02-26 IMAGING — CR DG CHEST 2V
3 series · 3 of 3 positions shown · non-contrast
Comparison: 07/15/2015.

CLINICAL DATA: AICD.

EXAM:
CHEST  2 VIEW

[chest pa]
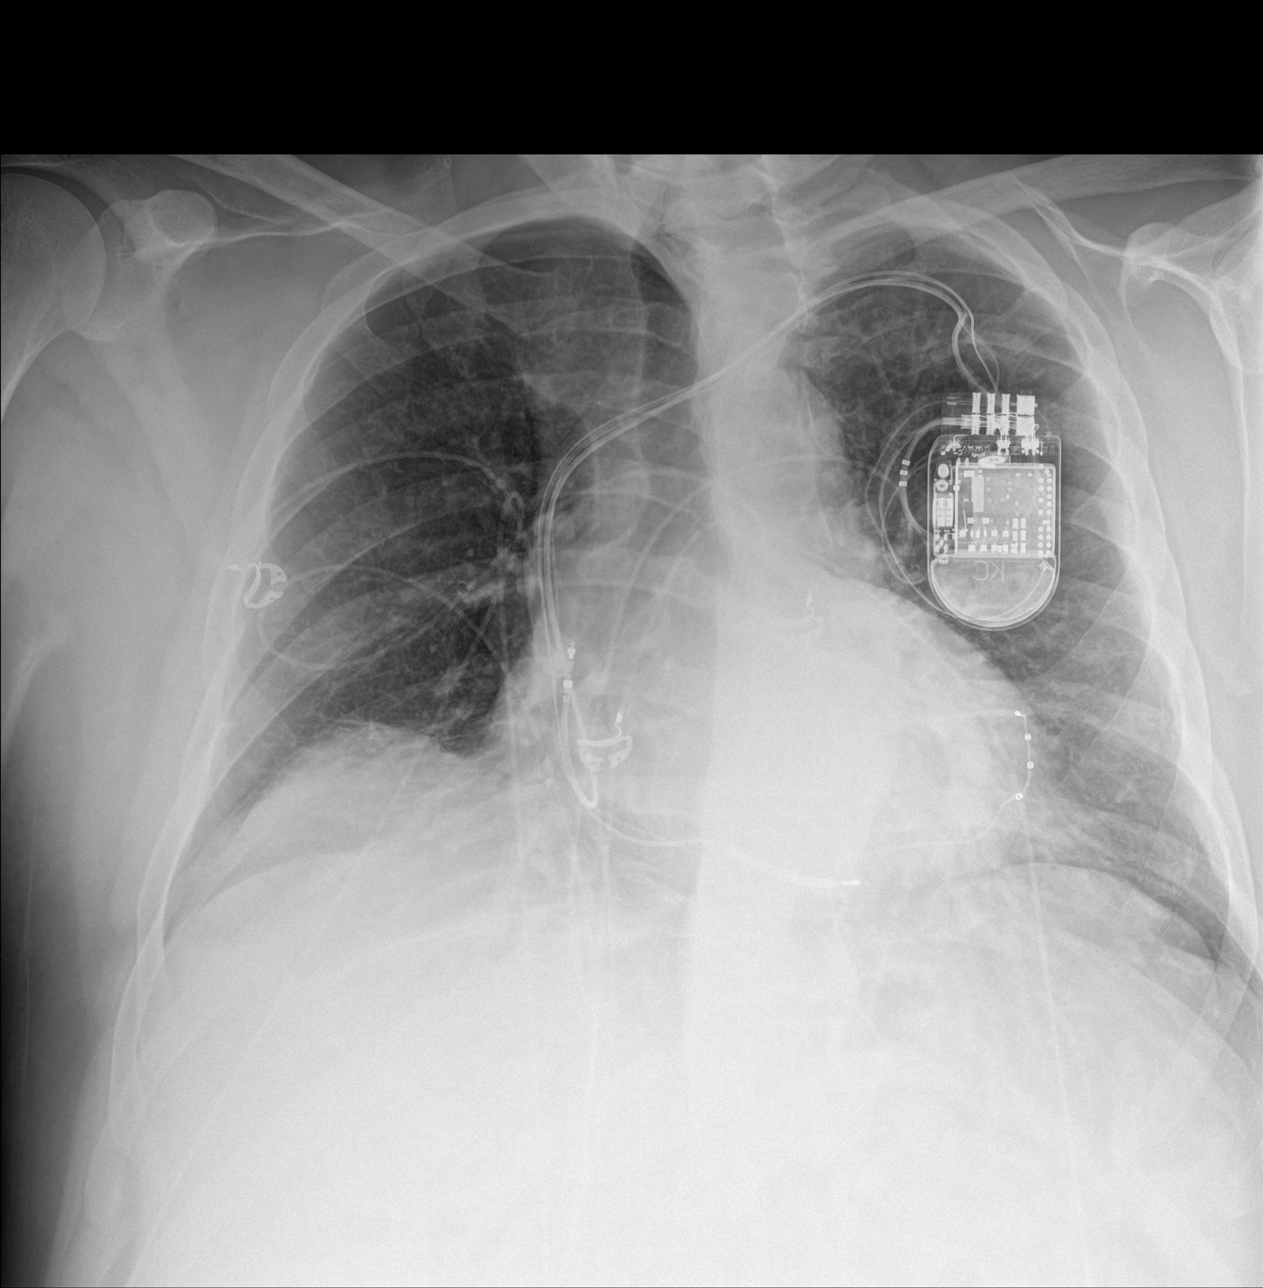

[chest lat]
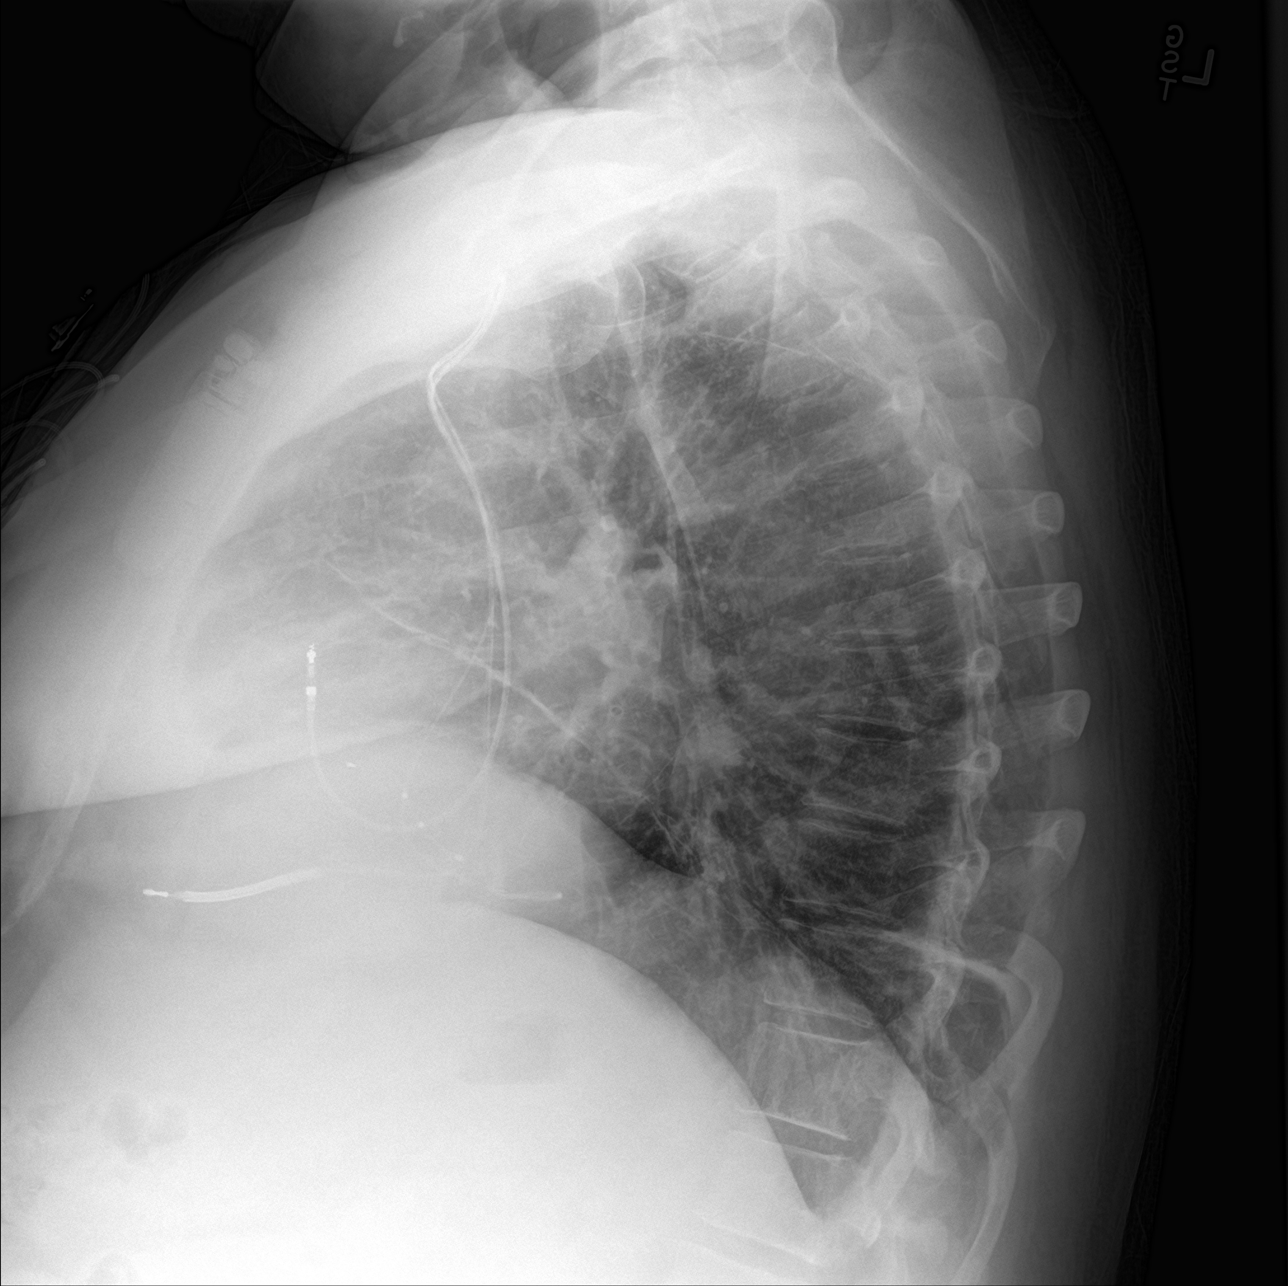

[chest ap]
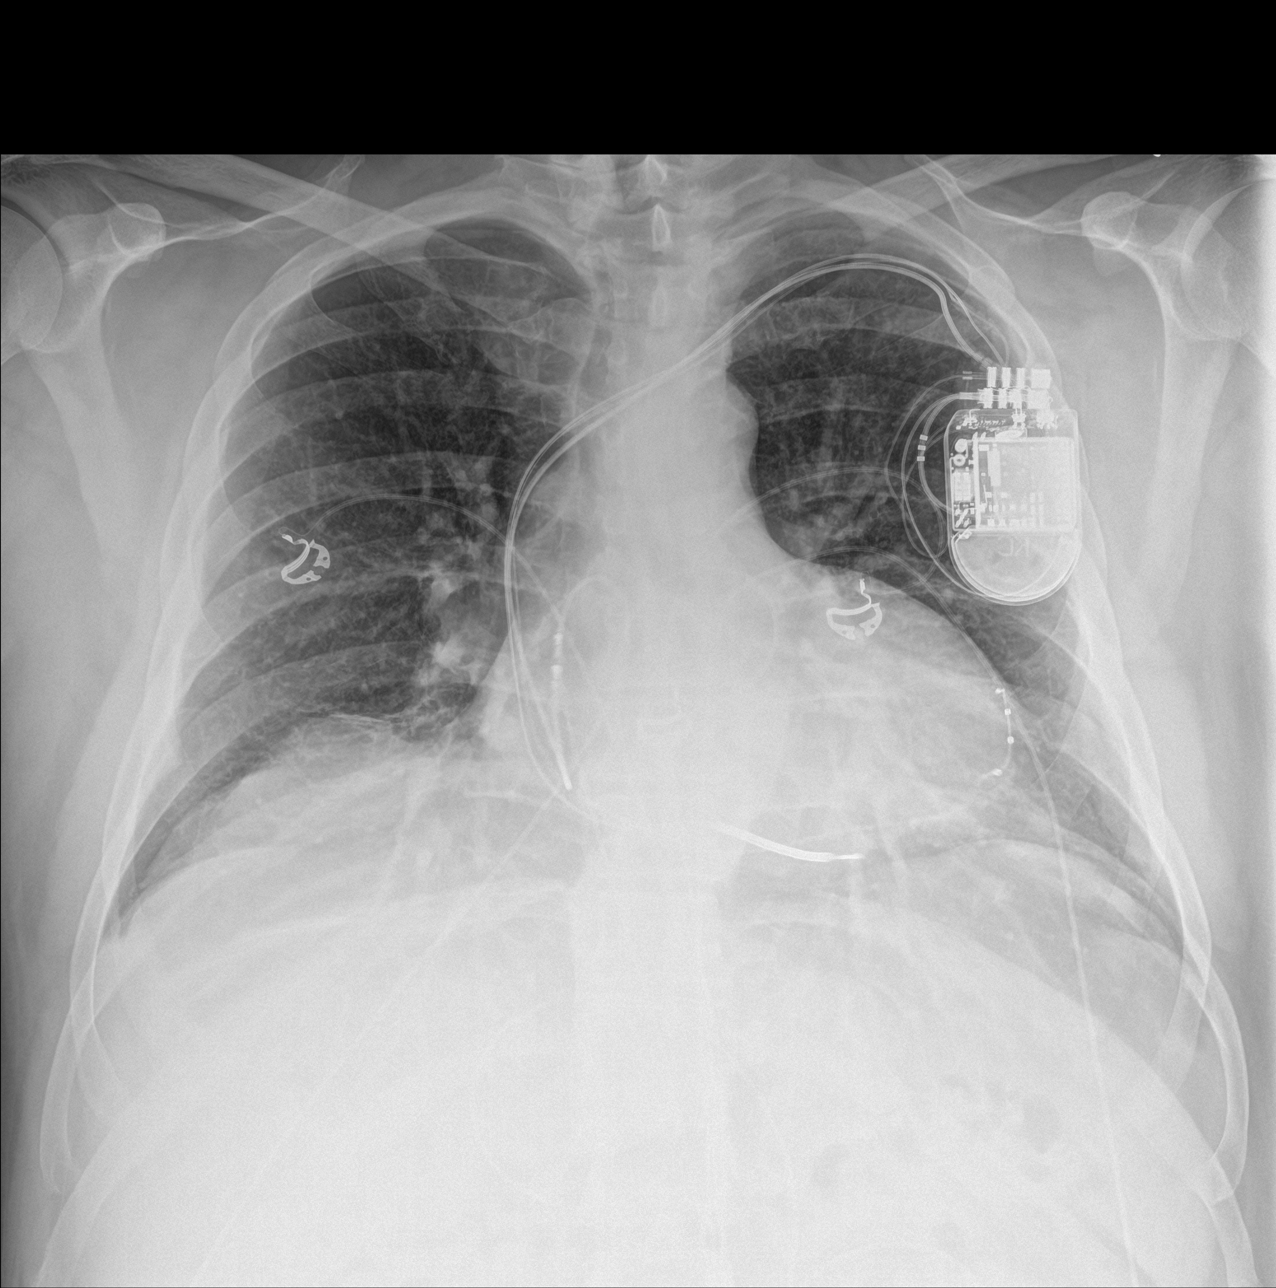

[3 of 3 positions shown; findings below may reference images not displayed]

FINDINGS: Mediastinum hilar structures normal. Interim placement of AICD.
Stable cardiomegaly. No pulmonary venous congestion. Low lung
volumes with basilar atelectasis. No pneumothorax.
IMPRESSION: 1. Interim placement of AICD. Stable cardiomegaly. No pneumothorax.

2. Low lung volumes with mild basilar atelectasis.

## 2018-03-30 ENCOUNTER — Ambulatory Visit (INDEPENDENT_AMBULATORY_CARE_PROVIDER_SITE_OTHER): Payer: BC Managed Care – PPO | Admitting: *Deleted

## 2018-03-30 DIAGNOSIS — I428 Other cardiomyopathies: Secondary | ICD-10-CM

## 2018-03-30 NOTE — Progress Notes (Signed)
Remote ICD transmission.   

## 2018-03-31 ENCOUNTER — Encounter: Payer: Self-pay | Admitting: Cardiology

## 2018-04-01 ENCOUNTER — Other Ambulatory Visit: Payer: Self-pay | Admitting: Internal Medicine

## 2018-04-01 DIAGNOSIS — I5022 Chronic systolic (congestive) heart failure: Secondary | ICD-10-CM

## 2018-04-05 LAB — CUP PACEART REMOTE DEVICE CHECK
Battery Remaining Percentage: 66 %
Brady Statistic AP VS Percent: 1 %
Brady Statistic AS VP Percent: 98 %
Brady Statistic AS VS Percent: 1 %
HIGH POWER IMPEDANCE MEASURED VALUE: 82 Ohm
HighPow Impedance: 82 Ohm
Implantable Lead Implant Date: 20170426
Implantable Lead Implant Date: 20170426
Implantable Lead Location: 753860
Implantable Lead Serial Number: 385378
Lead Channel Impedance Value: 440 Ohm
Lead Channel Pacing Threshold Amplitude: 1.125 V
Lead Channel Sensing Intrinsic Amplitude: 2.7 mV
Lead Channel Setting Pacing Amplitude: 1.25 V
Lead Channel Setting Pacing Amplitude: 2.125
Lead Channel Setting Pacing Amplitude: 2.5 V
Lead Channel Setting Pacing Pulse Width: 0.8 ms
MDC IDC LEAD IMPLANT DT: 20170426
MDC IDC LEAD LOCATION: 753858
MDC IDC LEAD LOCATION: 753859
MDC IDC MSMT BATTERY REMAINING LONGEVITY: 57 mo
MDC IDC MSMT BATTERY VOLTAGE: 2.93 V
MDC IDC MSMT LEADCHNL LV IMPEDANCE VALUE: 680 Ohm
MDC IDC MSMT LEADCHNL LV PACING THRESHOLD AMPLITUDE: 0.75 V
MDC IDC MSMT LEADCHNL LV PACING THRESHOLD PULSEWIDTH: 0.8 ms
MDC IDC MSMT LEADCHNL RA IMPEDANCE VALUE: 480 Ohm
MDC IDC MSMT LEADCHNL RA PACING THRESHOLD PULSEWIDTH: 0.4 ms
MDC IDC MSMT LEADCHNL RV PACING THRESHOLD AMPLITUDE: 0.5 V
MDC IDC MSMT LEADCHNL RV PACING THRESHOLD PULSEWIDTH: 0.4 ms
MDC IDC MSMT LEADCHNL RV SENSING INTR AMPL: 11.4 mV
MDC IDC PG IMPLANT DT: 20170426
MDC IDC SESS DTM: 20190418060016
MDC IDC SET LEADCHNL RV PACING PULSEWIDTH: 0.4 ms
MDC IDC SET LEADCHNL RV SENSING SENSITIVITY: 0.5 mV
MDC IDC STAT BRADY AP VP PERCENT: 1 %
MDC IDC STAT BRADY RA PERCENT PACED: 1 %
Pulse Gen Serial Number: 7307210

## 2018-05-06 ENCOUNTER — Other Ambulatory Visit: Payer: Self-pay | Admitting: Nurse Practitioner

## 2018-06-29 ENCOUNTER — Ambulatory Visit (INDEPENDENT_AMBULATORY_CARE_PROVIDER_SITE_OTHER): Payer: BC Managed Care – PPO | Admitting: *Deleted

## 2018-06-29 DIAGNOSIS — I428 Other cardiomyopathies: Secondary | ICD-10-CM

## 2018-06-29 NOTE — Progress Notes (Signed)
Remote ICD transmission.   

## 2018-06-30 ENCOUNTER — Encounter: Payer: Self-pay | Admitting: Cardiology

## 2018-07-05 ENCOUNTER — Encounter: Payer: Self-pay | Admitting: Internal Medicine

## 2018-07-11 LAB — CUP PACEART REMOTE DEVICE CHECK
Battery Remaining Longevity: 53 mo
Battery Remaining Percentage: 63 %
Brady Statistic AP VP Percent: 1 %
Brady Statistic RA Percent Paced: 1 %
Date Time Interrogation Session: 20190718060016
HIGH POWER IMPEDANCE MEASURED VALUE: 78 Ohm
HighPow Impedance: 78 Ohm
Implantable Lead Implant Date: 20170426
Implantable Lead Implant Date: 20170426
Implantable Lead Location: 753859
Implantable Lead Model: 293
Implantable Lead Serial Number: 385378
Implantable Pulse Generator Implant Date: 20170426
Lead Channel Impedance Value: 430 Ohm
Lead Channel Impedance Value: 450 Ohm
Lead Channel Pacing Threshold Amplitude: 0.5 V
Lead Channel Pacing Threshold Amplitude: 0.75 V
Lead Channel Pacing Threshold Pulse Width: 0.4 ms
Lead Channel Pacing Threshold Pulse Width: 0.8 ms
Lead Channel Setting Pacing Amplitude: 2.125
Lead Channel Setting Pacing Pulse Width: 0.4 ms
Lead Channel Setting Sensing Sensitivity: 0.5 mV
MDC IDC LEAD IMPLANT DT: 20170426
MDC IDC LEAD LOCATION: 753858
MDC IDC LEAD LOCATION: 753860
MDC IDC MSMT BATTERY VOLTAGE: 2.93 V
MDC IDC MSMT LEADCHNL LV IMPEDANCE VALUE: 650 Ohm
MDC IDC MSMT LEADCHNL RA PACING THRESHOLD AMPLITUDE: 1.125 V
MDC IDC MSMT LEADCHNL RA PACING THRESHOLD PULSEWIDTH: 0.4 ms
MDC IDC MSMT LEADCHNL RA SENSING INTR AMPL: 2.5 mV
MDC IDC MSMT LEADCHNL RV SENSING INTR AMPL: 8.5 mV
MDC IDC SET LEADCHNL LV PACING AMPLITUDE: 1.25 V
MDC IDC SET LEADCHNL LV PACING PULSEWIDTH: 0.8 ms
MDC IDC SET LEADCHNL RV PACING AMPLITUDE: 2.5 V
MDC IDC STAT BRADY AP VS PERCENT: 1 %
MDC IDC STAT BRADY AS VP PERCENT: 98 %
MDC IDC STAT BRADY AS VS PERCENT: 1 %
Pulse Gen Serial Number: 7307210

## 2018-07-11 NOTE — Telephone Encounter (Signed)
Let's try switching to torsemide 40mg  daily (can take 20 mg if it is too much). Please call it in for him.   I called him and spoke to him personally and let him know we would make the change. Thanks

## 2018-07-11 NOTE — Telephone Encounter (Signed)
Patient sent a Mychart message to Dr.Kleins office and it was forwarded to [facility] on 7/24. I do not see a note where this has been addressed. Message routed to Dr.Bensimhon for advice (please see note below)   This is for Dan Bensimhon, BUT I CAN NOT EVER REACH HIM OR HIS NURSE. I am having severe diarrhea with the med, furosemide. I have gone down to one pill a day, but I can tell it is still not working to its full potential. I still feel winded when I walk, which I did not feel that way when I was taking two pills, which is what is prescribed. Is there another pill I can take that is just as good, but does not give me diarrhea?     This did not start until about a month ago, and I just figured out what was causing it.     Jesusita Oka Hilliard Clark  [phone]

## 2018-07-12 ENCOUNTER — Other Ambulatory Visit (HOSPITAL_COMMUNITY): Payer: Self-pay | Admitting: *Deleted

## 2018-07-12 MED ORDER — TORSEMIDE 20 MG PO TABS: 40.0000 mg | ORAL_TABLET | Freq: Every day | ORAL | 3 refills | Status: DC

## 2018-09-28 ENCOUNTER — Other Ambulatory Visit: Payer: Self-pay | Admitting: Internal Medicine

## 2018-09-28 ENCOUNTER — Ambulatory Visit (INDEPENDENT_AMBULATORY_CARE_PROVIDER_SITE_OTHER): Payer: BC Managed Care – PPO | Admitting: *Deleted

## 2018-09-28 DIAGNOSIS — I428 Other cardiomyopathies: Secondary | ICD-10-CM | POA: Diagnosis not present

## 2018-09-28 DIAGNOSIS — I5022 Chronic systolic (congestive) heart failure: Secondary | ICD-10-CM

## 2018-09-28 NOTE — Progress Notes (Signed)
Remote ICD transmission.   

## 2018-10-23 ENCOUNTER — Other Ambulatory Visit: Payer: Self-pay | Admitting: Internal Medicine

## 2018-11-13 ENCOUNTER — Other Ambulatory Visit: Payer: Self-pay

## 2018-11-13 NOTE — Telephone Encounter (Signed)
Please review refill for Amlodipine 5 mg

## 2018-11-14 MED ORDER — AMLODIPINE BESYLATE 5 MG PO TABS: 5.0000 mg | ORAL_TABLET | Freq: Every day | ORAL | 0 refills | Status: DC

## 2018-11-21 ENCOUNTER — Other Ambulatory Visit: Payer: Self-pay | Admitting: Internal Medicine

## 2018-12-02 LAB — CUP PACEART REMOTE DEVICE CHECK
Battery Voltage: 2.93 V
Brady Statistic AP VS Percent: 1 %
Brady Statistic RA Percent Paced: 1 %
Date Time Interrogation Session: 20191017060016
HighPow Impedance: 82 Ohm
HighPow Impedance: 82 Ohm
Implantable Lead Implant Date: 20170426
Implantable Lead Location: 753858
Implantable Lead Location: 753859
Implantable Lead Model: 293
Lead Channel Impedance Value: 450 Ohm
Lead Channel Impedance Value: 690 Ohm
Lead Channel Pacing Threshold Amplitude: 0.5 V
Lead Channel Pacing Threshold Amplitude: 0.75 V
Lead Channel Pacing Threshold Pulse Width: 0.4 ms
Lead Channel Pacing Threshold Pulse Width: 0.8 ms
Lead Channel Sensing Intrinsic Amplitude: 10.9 mV
Lead Channel Setting Pacing Amplitude: 2.125
Lead Channel Setting Pacing Pulse Width: 0.4 ms
Lead Channel Setting Sensing Sensitivity: 0.5 mV
MDC IDC LEAD IMPLANT DT: 20170426
MDC IDC LEAD IMPLANT DT: 20170426
MDC IDC LEAD LOCATION: 753860
MDC IDC LEAD SERIAL: 385378
MDC IDC MSMT BATTERY REMAINING LONGEVITY: 51 mo
MDC IDC MSMT BATTERY REMAINING PERCENTAGE: 60 %
MDC IDC MSMT LEADCHNL RA PACING THRESHOLD AMPLITUDE: 1.125 V
MDC IDC MSMT LEADCHNL RA SENSING INTR AMPL: 2.4 mV
MDC IDC MSMT LEADCHNL RV IMPEDANCE VALUE: 400 Ohm
MDC IDC MSMT LEADCHNL RV PACING THRESHOLD PULSEWIDTH: 0.4 ms
MDC IDC PG IMPLANT DT: 20170426
MDC IDC PG SERIAL: 7307210
MDC IDC SET LEADCHNL LV PACING AMPLITUDE: 1.25 V
MDC IDC SET LEADCHNL LV PACING PULSEWIDTH: 0.8 ms
MDC IDC SET LEADCHNL RV PACING AMPLITUDE: 2.5 V
MDC IDC STAT BRADY AP VP PERCENT: 1.2 %
MDC IDC STAT BRADY AS VP PERCENT: 98 %
MDC IDC STAT BRADY AS VS PERCENT: 1 %

## 2018-12-08 ENCOUNTER — Other Ambulatory Visit: Payer: Self-pay | Admitting: *Deleted

## 2018-12-08 MED ORDER — AMLODIPINE BESYLATE 5 MG PO TABS: 5.0000 mg | ORAL_TABLET | Freq: Every day | ORAL | 0 refills | Status: DC

## 2018-12-28 ENCOUNTER — Ambulatory Visit (INDEPENDENT_AMBULATORY_CARE_PROVIDER_SITE_OTHER): Payer: BC Managed Care – PPO

## 2018-12-28 ENCOUNTER — Telehealth: Payer: Self-pay | Admitting: Internal Medicine

## 2018-12-28 ENCOUNTER — Other Ambulatory Visit: Payer: Self-pay | Admitting: *Deleted

## 2018-12-28 DIAGNOSIS — I428 Other cardiomyopathies: Secondary | ICD-10-CM

## 2018-12-28 DIAGNOSIS — I5022 Chronic systolic (congestive) heart failure: Secondary | ICD-10-CM

## 2018-12-28 MED ORDER — AMLODIPINE BESYLATE 5 MG PO TABS: 5.0000 mg | ORAL_TABLET | Freq: Every day | ORAL | 1 refills | Status: DC

## 2018-12-28 MED ORDER — EPLERENONE 25 MG PO TABS: 25.0000 mg | ORAL_TABLET | Freq: Every day | ORAL | 1 refills | Status: DC

## 2018-12-28 NOTE — Telephone Encounter (Signed)
°*  STAT* If patient is at the pharmacy, call can be transferred to refill team.   1. Which medications need to be refilled? (please list name of each medication and dose if known) Amlodipine 5mg  and Inspra 25mg     2. Which pharmacy/location (including street and city if local pharmacy) is medication to be sent to?[facility]  3. Do they need a 30 day or 90 day supply? Until past due appt

## 2018-12-29 NOTE — Progress Notes (Signed)
Remote ICD transmission.   

## 2018-12-30 LAB — CUP PACEART REMOTE DEVICE CHECK
Battery Remaining Longevity: 47 mo
Battery Remaining Percentage: 56 %
Brady Statistic AP VS Percent: 1 %
Brady Statistic AS VP Percent: 97 %
Brady Statistic AS VS Percent: 1 %
Brady Statistic RA Percent Paced: 1.1 %
Date Time Interrogation Session: 20200116070017
HighPow Impedance: 74 Ohm
HighPow Impedance: 74 Ohm
Implantable Lead Implant Date: 20170426
Implantable Lead Implant Date: 20170426
Implantable Lead Implant Date: 20170426
Implantable Lead Location: 753858
Implantable Lead Location: 753859
Implantable Lead Location: 753860
Implantable Lead Model: 293
Implantable Lead Serial Number: 385378
Implantable Pulse Generator Implant Date: 20170426
Lead Channel Impedance Value: 440 Ohm
Lead Channel Impedance Value: 450 Ohm
Lead Channel Impedance Value: 680 Ohm
Lead Channel Pacing Threshold Amplitude: 0.5 V
Lead Channel Pacing Threshold Amplitude: 0.75 V
Lead Channel Pacing Threshold Amplitude: 1 V
Lead Channel Pacing Threshold Pulse Width: 0.4 ms
Lead Channel Pacing Threshold Pulse Width: 0.8 ms
Lead Channel Sensing Intrinsic Amplitude: 2.7 mV
Lead Channel Sensing Intrinsic Amplitude: 8.4 mV
Lead Channel Setting Pacing Amplitude: 1.25 V
Lead Channel Setting Pacing Amplitude: 2 V
Lead Channel Setting Pacing Amplitude: 2.5 V
Lead Channel Setting Pacing Pulse Width: 0.4 ms
Lead Channel Setting Pacing Pulse Width: 0.8 ms
MDC IDC MSMT BATTERY VOLTAGE: 2.92 V
MDC IDC MSMT LEADCHNL RV PACING THRESHOLD PULSEWIDTH: 0.4 ms
MDC IDC SET LEADCHNL RV SENSING SENSITIVITY: 0.5 mV
MDC IDC STAT BRADY AP VP PERCENT: 1.4 %
Pulse Gen Serial Number: 7307210

## 2019-01-23 ENCOUNTER — Other Ambulatory Visit: Payer: Self-pay | Admitting: Internal Medicine

## 2019-02-14 ENCOUNTER — Other Ambulatory Visit: Payer: Self-pay | Admitting: Internal Medicine

## 2019-02-15 ENCOUNTER — Other Ambulatory Visit: Payer: Self-pay | Admitting: Internal Medicine

## 2019-02-15 MED ORDER — AMLODIPINE BESYLATE 5 MG PO TABS: 5.0000 mg | ORAL_TABLET | Freq: Every day | ORAL | 0 refills | Status: DC

## 2019-02-15 MED ORDER — EPLERENONE 25 MG PO TABS: 25.0000 mg | ORAL_TABLET | Freq: Every day | ORAL | 0 refills | Status: DC

## 2019-03-02 ENCOUNTER — Encounter: Payer: BC Managed Care – PPO | Admitting: Physician Assistant

## 2019-03-08 ENCOUNTER — Encounter: Payer: BC Managed Care – PPO | Admitting: Internal Medicine

## 2019-03-10 ENCOUNTER — Other Ambulatory Visit: Payer: Self-pay | Admitting: Internal Medicine

## 2019-03-12 ENCOUNTER — Other Ambulatory Visit: Payer: Self-pay | Admitting: Nurse Practitioner

## 2019-03-27 ENCOUNTER — Other Ambulatory Visit (HOSPITAL_COMMUNITY): Payer: Self-pay | Admitting: Cardiology

## 2019-03-27 DIAGNOSIS — I5022 Chronic systolic (congestive) heart failure: Secondary | ICD-10-CM

## 2019-03-27 MED ORDER — AMLODIPINE BESYLATE 5 MG PO TABS: 5.0000 mg | ORAL_TABLET | Freq: Every day | ORAL | 0 refills | Status: DC

## 2019-03-27 MED ORDER — CARVEDILOL 25 MG PO TABS: 25.0000 mg | ORAL_TABLET | Freq: Two times a day (BID) | ORAL | 0 refills | Status: DC

## 2019-03-27 MED ORDER — SACUBITRIL-VALSARTAN 97-103 MG PO TABS: 1.0000 | ORAL_TABLET | Freq: Two times a day (BID) | ORAL | 0 refills | Status: DC

## 2019-03-27 MED ORDER — TORSEMIDE 20 MG PO TABS: 40.0000 mg | ORAL_TABLET | Freq: Once | ORAL | 0 refills | Status: DC

## 2019-03-27 MED ORDER — EPLERENONE 25 MG PO TABS: 25.0000 mg | ORAL_TABLET | Freq: Every day | ORAL | 0 refills | Status: DC

## 2019-03-27 MED ORDER — SIMVASTATIN 20 MG PO TABS: 20.0000 mg | ORAL_TABLET | Freq: Every day | ORAL | 0 refills | Status: DC

## 2019-03-29 ENCOUNTER — Other Ambulatory Visit: Payer: Self-pay

## 2019-03-29 ENCOUNTER — Ambulatory Visit (INDEPENDENT_AMBULATORY_CARE_PROVIDER_SITE_OTHER): Payer: BC Managed Care – PPO | Admitting: *Deleted

## 2019-03-29 DIAGNOSIS — I428 Other cardiomyopathies: Secondary | ICD-10-CM | POA: Diagnosis not present

## 2019-03-29 LAB — CUP PACEART REMOTE DEVICE CHECK
Battery Remaining Longevity: 45 mo
Battery Remaining Percentage: 54 %
Battery Voltage: 2.92 V
Brady Statistic AP VP Percent: 1.4 %
Brady Statistic AP VS Percent: 1 %
Brady Statistic AS VP Percent: 97 %
Brady Statistic AS VS Percent: 1.1 %
Brady Statistic RA Percent Paced: 1.1 %
Brady Statistic RV Percent Paced: 98 %
Date Time Interrogation Session: 20200416075232
HighPow Impedance: 73 Ohm
HighPow Impedance: 73 Ohm
Implantable Lead Implant Date: 20170426
Implantable Lead Implant Date: 20170426
Implantable Lead Implant Date: 20170426
Implantable Lead Location: 753858
Implantable Lead Location: 753859
Implantable Lead Location: 753860
Implantable Lead Model: 293
Implantable Lead Serial Number: 385378
Implantable Pulse Generator Implant Date: 20170426
Lead Channel Impedance Value: 350 Ohm
Lead Channel Impedance Value: 450 Ohm
Lead Channel Impedance Value: 680 Ohm
Lead Channel Pacing Threshold Amplitude: 0.75 V
Lead Channel Pacing Threshold Amplitude: 1.125 V
Lead Channel Pacing Threshold Pulse Width: 0.4 ms
Lead Channel Pacing Threshold Pulse Width: 0.8 ms
Lead Channel Sensing Intrinsic Amplitude: 2.6 mV
Lead Channel Sensing Intrinsic Amplitude: 7.8 mV
Lead Channel Setting Pacing Amplitude: 1.25 V
Lead Channel Setting Pacing Amplitude: 2.125
Lead Channel Setting Pacing Amplitude: 2.5 V
Lead Channel Setting Pacing Pulse Width: 0.4 ms
Lead Channel Setting Pacing Pulse Width: 0.8 ms
Lead Channel Setting Sensing Sensitivity: 0.5 mV
Pulse Gen Serial Number: 7307210

## 2019-04-04 ENCOUNTER — Encounter: Payer: Self-pay | Admitting: Cardiology

## 2019-04-04 NOTE — Progress Notes (Signed)
Remote ICD transmission.   

## 2019-04-13 ENCOUNTER — Other Ambulatory Visit: Payer: Self-pay

## 2019-04-13 ENCOUNTER — Telehealth (INDEPENDENT_AMBULATORY_CARE_PROVIDER_SITE_OTHER): Payer: BC Managed Care – PPO | Admitting: Internal Medicine

## 2019-04-13 VITALS — Ht 70.0 in | Wt 285.0 lb

## 2019-04-13 DIAGNOSIS — I447 Left bundle-branch block, unspecified: Secondary | ICD-10-CM

## 2019-04-13 DIAGNOSIS — Z95810 Presence of automatic (implantable) cardiac defibrillator: Secondary | ICD-10-CM | POA: Diagnosis not present

## 2019-04-13 DIAGNOSIS — I428 Other cardiomyopathies: Secondary | ICD-10-CM

## 2019-04-13 DIAGNOSIS — I5022 Chronic systolic (congestive) heart failure: Secondary | ICD-10-CM | POA: Diagnosis not present

## 2019-04-13 DIAGNOSIS — I452 Bifascicular block: Secondary | ICD-10-CM | POA: Diagnosis not present

## 2019-04-13 NOTE — Progress Notes (Signed)
Electrophysiology TeleHealth Note   Due to national recommendations of social distancing due to COVID 19, an audio/video telehealth visit is felt to be most appropriate for this patient at this time.  See MyChart message from today for the patient's consent to telehealth for [facility].   Date:  04/13/2019   ID:  Travis Cline, DOB 1966/04/10, MRN [id number]  Location: patient's home  Provider location: [address] [place]  Evaluation Performed: Follow-up visit  PCP:  Lupita Raider, MD  Cardiologist:    Db Electrophysiologist:  SK   Chief Complaint:  *Congestive heart failure and an ICD  History of Present Illness:    Travis Cline is a 53 y.o. male who presents via audio/video conferencing for a telehealth visit today.  Since last being seen in our clinic for CRT-D for nonischemic cardiomyopathy and CHF , the patient reports he relatively well.  He is still living in a camper as they are building their house.  He is eating out less and has lost a little bit of weight.  He remains significantly overweight.  No edema.  Nocturnal dyspnea sleep disordered breathing.   DATE TEST EF   12/16    Echo 20-25 %   6/18    Echo 30-35 %         Date Cr K  4/18 0.99 4.8  1/19  0.91 4.2     The patient denies symptoms of fevers, chills, cough, or new SOB worrisome for COVID 19.    Past Medical History:  Diagnosis Date  . Asthma   . Cardiomyopathy (HCC)    a.  06/2015  echo: EF 15%, diffuse hypokinesis, anteroseptal/apical akinesis. Mild mitral regurgitation. Severely dilated left atrium. Mildly reduced RV function. ulnar artery systolic pressure 54 mmHg.  . CHF (congestive heart failure) (HCC)   . Erectile dysfunction   . Essential hypertension   . GERD (gastroesophageal reflux disease)   . Gout   . Hyperlipemia   . Insomnia   . LBBB (left bundle branch block)   . Morbid obesity (HCC)     Past Surgical History:  Procedure Laterality  Date  . CARDIAC CATHETERIZATION N/A 07/21/2015   Procedure: Left Heart Cath and Coronary Angiography;  Surgeon: Lyn Records, MD;  Location: [facility] INVASIVE CV LAB;  Service: Cardiovascular;  Laterality: N/A;  . EP IMPLANTABLE DEVICE  04/07/2016   bvi  . EP IMPLANTABLE DEVICE N/A 04/07/2016   Procedure: BiV ICD Insertion CRT-D;  Surgeon: Duke Salvia, MD;  Location: [facility] INVASIVE CV LAB;  Service: Cardiovascular;  Laterality: N/A;  . HERNIA REPAIR    . KNEE ARTHROSCOPY Right 11/17/2016   Procedure: RIGHT ARTHROSCOPY KNEE WITH MEDIAL MENISECTOMY;  Surgeon: Gean Birchwood, MD;  Location: [facility] OR;  Service: Orthopedics;  Laterality: Right;  . STERIOD INJECTION Left 11/17/2016   Procedure: STEROID INJECTION left knee;  Surgeon: Gean Birchwood, MD;  Location: [facility] OR;  Service: Orthopedics;  Laterality: Left;    Current Outpatient Medications  Medication Sig Dispense Refill  . amLODipine (NORVASC) 5 MG tablet Take 1 tablet (5 mg total) by mouth daily. 90 tablet 0  . aspirin EC 81 MG tablet Take 1 tablet (81 mg total) by mouth daily. 90 tablet 3  . carvedilol (COREG) 25 MG tablet Take 1 tablet (25 mg total) by mouth 2 (two) times daily with a meal. 180 tablet 0  . cimetidine (TAGAMET) 200 MG tablet Take 200 mg by mouth 2 (two) times  daily as needed. Reported on 02/24/2016    . colchicine 0.6 MG tablet Take 1 tablet (0.6 mg total) by mouth daily as needed. Reported on 04/23/2016 60 tablet 2  . eplerenone (INSPRA) 25 MG tablet Take 1 tablet (25 mg total) by mouth daily. Please keep upcoming appt in March for future refills. Thank you 90 tablet 0  . Multiple Vitamins-Minerals (MULTIVITAMIN WITH MINERALS) tablet Take 1 tablet by mouth daily.    . nitroGLYCERIN (NITROSTAT) 0.4 MG SL tablet Place 0.4 mg under the tongue every 5 (five) minutes as needed for chest pain.    . sacubitril-valsartan (ENTRESTO) 97-103 MG Take 1 tablet by mouth 2 (two) times daily. 180 tablet 0  . simvastatin (ZOCOR) 20 MG tablet Take 1 tablet (20  mg total) by mouth daily. 90 tablet 0  . torsemide (DEMADEX) 20 MG tablet Take 2 tablets (40 mg total) by mouth once for 1 dose. 180 tablet 0  . ULORIC 40 MG tablet Take 80 mg by mouth daily.   3   No current facility-administered medications for this visit.     Allergies:   No known allergies   Social History:  The patient  reports that he has never smoked. He has never used smokeless tobacco. He reports current alcohol use of about 1.0 - 2.0 standard drinks of alcohol per week. He reports that he does not use drugs.   Family History:  The patient's   family history includes Arthritis in his father; Heart disease in his mother; Other in his father, mother, and unknown relative.   ROS:  Please see the history of present illness.   All other systems are personally reviewed and negative.    Exam:    Vital Signs:  Ht 5\' 10"  (1.778 m)   Wt 285 lb (129.3 kg)   BMI 40.89 kg/m     Well appearing, alert and conversant, regular work of breathing,  good skin color Eyes- anicteric, neuro- grossly intact, skin- no apparent rash or lesions or cyanosis, mouth- oral mucosa is pink   Labs/Other Tests and Data Reviewed:    Recent Labs: No results found for requested labs within last 8760 hours.   Wt Readings from Last 3 Encounters:  04/13/19 285 lb (129.3 kg)  12/22/17 289 lb 4 oz (131.2 kg)  10/04/17 282 lb 12.8 oz (128.3 kg)     Other studies personally reviewed: Additional studies/ records that were reviewed today include:    *  Last device remote is reviewed from [employer] PDF dated 4/20 which reveals normal device function,   arrhythmias - none     ASSESSMENT & PLAN:    NICM  LBBB  ICD St Jude  CRT  Fam Hx of LBBB/Cardiomyopathy  Morbidly obese\  Sleep disordered breathing   Congestive heart failure-chronic-systolic heart grade 2  Patient sounds like he is euvolemic.  Having an unusual symptom of shortness of breath after he walks.  No chest pain.  Still  significantly overweight.  Discussed diet, low carbs, need for concomitant exercise.  We will try and arrange for home sleep study.    COVID 19 screen The patient denies symptoms of COVID 19 at this time.  The importance of social distancing was discussed today.  Follow-up: 62 m Next remote: As Scheduled   Current medicines are reviewed at length with the patient today.    The patient does not have concerns regarding his medicines.  The following changes were made today:  none  Labs/ tests  ordered today include: Home sleep study metabolic profile  No orders of the defined types were placed in this encounter.   Future tests ( post COVID )    Patient Risk:  after full review of this patients clinical status, I feel that they are at moderate risk at this time.  Today, I have spent 13  minutes with the patient with telehealth technology discussing the above.  Signed, Sherryl Manges, MD  04/13/2019 3:45 PM     [facility] [address] [place] [address] [phone] (office) [phone] (fax)

## 2019-06-24 ENCOUNTER — Other Ambulatory Visit (HOSPITAL_COMMUNITY): Payer: Self-pay | Admitting: Internal Medicine

## 2019-06-27 ENCOUNTER — Other Ambulatory Visit (HOSPITAL_COMMUNITY): Payer: Self-pay | Admitting: Internal Medicine

## 2019-06-28 ENCOUNTER — Ambulatory Visit (INDEPENDENT_AMBULATORY_CARE_PROVIDER_SITE_OTHER): Payer: BC Managed Care – PPO | Admitting: *Deleted

## 2019-06-28 DIAGNOSIS — I428 Other cardiomyopathies: Secondary | ICD-10-CM | POA: Diagnosis not present

## 2019-06-28 LAB — CUP PACEART REMOTE DEVICE CHECK
Battery Remaining Longevity: 42 mo
Battery Remaining Percentage: 51 %
Brady Statistic RA Percent Paced: 1.2 %
Brady Statistic RV Percent Paced: 98 %
Date Time Interrogation Session: 20200716115811
HighPow Impedance: 69 Ohm
Implantable Lead Implant Date: 20170426
Implantable Lead Implant Date: 20170426
Implantable Lead Implant Date: 20170426
Implantable Lead Location: 753858
Implantable Lead Location: 753859
Implantable Lead Location: 753860
Implantable Lead Model: 293
Implantable Lead Serial Number: 385378
Implantable Pulse Generator Implant Date: 20170426
Lead Channel Impedance Value: 390 Ohm
Lead Channel Impedance Value: 450 Ohm
Lead Channel Impedance Value: 630 Ohm
Lead Channel Pacing Threshold Amplitude: 1.125 V
Lead Channel Pacing Threshold Pulse Width: 0.4 ms
Lead Channel Sensing Intrinsic Amplitude: 10.8 mV
Lead Channel Sensing Intrinsic Amplitude: 2.8 mV
Lead Channel Setting Pacing Amplitude: 1.25 V
Lead Channel Setting Pacing Amplitude: 2.125
Lead Channel Setting Pacing Amplitude: 2.5 V
Lead Channel Setting Pacing Pulse Width: 0.4 ms
Lead Channel Setting Pacing Pulse Width: 0.8 ms
Lead Channel Setting Sensing Sensitivity: 0.5 mV
Pulse Gen Serial Number: 7307210

## 2019-07-05 ENCOUNTER — Encounter: Payer: Self-pay | Admitting: Cardiology

## 2019-07-05 NOTE — Progress Notes (Signed)
Remote ICD transmission.   

## 2019-07-18 ENCOUNTER — Other Ambulatory Visit (HOSPITAL_COMMUNITY): Payer: Self-pay | Admitting: Internal Medicine

## 2019-07-18 DIAGNOSIS — I5022 Chronic systolic (congestive) heart failure: Secondary | ICD-10-CM

## 2019-07-20 ENCOUNTER — Other Ambulatory Visit (HOSPITAL_COMMUNITY): Payer: Self-pay | Admitting: Internal Medicine

## 2019-08-11 ENCOUNTER — Other Ambulatory Visit (HOSPITAL_COMMUNITY): Payer: Self-pay | Admitting: Internal Medicine

## 2019-08-12 ENCOUNTER — Other Ambulatory Visit (HOSPITAL_COMMUNITY): Payer: Self-pay | Admitting: Internal Medicine

## 2019-08-12 DIAGNOSIS — I5022 Chronic systolic (congestive) heart failure: Secondary | ICD-10-CM

## 2019-08-15 ENCOUNTER — Other Ambulatory Visit (HOSPITAL_COMMUNITY): Payer: Self-pay | Admitting: Internal Medicine

## 2019-08-15 DIAGNOSIS — I5022 Chronic systolic (congestive) heart failure: Secondary | ICD-10-CM

## 2019-08-29 ENCOUNTER — Other Ambulatory Visit (HOSPITAL_COMMUNITY): Payer: Self-pay | Admitting: Internal Medicine

## 2019-09-27 ENCOUNTER — Ambulatory Visit (INDEPENDENT_AMBULATORY_CARE_PROVIDER_SITE_OTHER): Payer: BC Managed Care – PPO | Admitting: *Deleted

## 2019-09-27 DIAGNOSIS — I5022 Chronic systolic (congestive) heart failure: Secondary | ICD-10-CM

## 2019-09-27 DIAGNOSIS — I447 Left bundle-branch block, unspecified: Secondary | ICD-10-CM | POA: Diagnosis not present

## 2019-09-29 LAB — CUP PACEART REMOTE DEVICE CHECK
Battery Remaining Longevity: 40 mo
Battery Remaining Percentage: 47 %
Battery Voltage: 2.92 V
Brady Statistic AP VP Percent: 1.8 %
Brady Statistic AP VS Percent: 1 %
Brady Statistic AS VP Percent: 96 %
Brady Statistic AS VS Percent: 1.5 %
Brady Statistic RA Percent Paced: 1.3 %
Date Time Interrogation Session: 20201016055025
HighPow Impedance: 71 Ohm
HighPow Impedance: 71 Ohm
Implantable Lead Implant Date: 20170426
Implantable Lead Implant Date: 20170426
Implantable Lead Implant Date: 20170426
Implantable Lead Location: 753858
Implantable Lead Location: 753859
Implantable Lead Location: 753860
Implantable Lead Model: 293
Implantable Lead Serial Number: 385378
Implantable Pulse Generator Implant Date: 20170426
Lead Channel Impedance Value: 400 Ohm
Lead Channel Impedance Value: 450 Ohm
Lead Channel Impedance Value: 680 Ohm
Lead Channel Pacing Threshold Amplitude: 0.5 V
Lead Channel Pacing Threshold Amplitude: 0.75 V
Lead Channel Pacing Threshold Amplitude: 1.125 V
Lead Channel Pacing Threshold Pulse Width: 0.4 ms
Lead Channel Pacing Threshold Pulse Width: 0.4 ms
Lead Channel Pacing Threshold Pulse Width: 0.8 ms
Lead Channel Sensing Intrinsic Amplitude: 10.8 mV
Lead Channel Sensing Intrinsic Amplitude: 2.6 mV
Lead Channel Setting Pacing Amplitude: 1.25 V
Lead Channel Setting Pacing Amplitude: 2.125
Lead Channel Setting Pacing Amplitude: 2.5 V
Lead Channel Setting Pacing Pulse Width: 0.4 ms
Lead Channel Setting Pacing Pulse Width: 0.8 ms
Lead Channel Setting Sensing Sensitivity: 0.5 mV
Pulse Gen Serial Number: 7307210

## 2019-10-03 ENCOUNTER — Other Ambulatory Visit (HOSPITAL_COMMUNITY): Payer: Self-pay | Admitting: Internal Medicine

## 2019-10-07 ENCOUNTER — Other Ambulatory Visit (HOSPITAL_COMMUNITY): Payer: Self-pay | Admitting: Internal Medicine

## 2019-10-07 DIAGNOSIS — I5022 Chronic systolic (congestive) heart failure: Secondary | ICD-10-CM

## 2019-10-11 NOTE — Progress Notes (Signed)
Remote ICD transmission.   

## 2019-10-29 ENCOUNTER — Other Ambulatory Visit (HOSPITAL_COMMUNITY): Payer: Self-pay | Admitting: Internal Medicine

## 2019-11-06 ENCOUNTER — Other Ambulatory Visit (HOSPITAL_COMMUNITY): Payer: Self-pay

## 2019-11-06 MED ORDER — SIMVASTATIN 20 MG PO TABS: 20.0000 mg | ORAL_TABLET | Freq: Every day | ORAL | 0 refills | Status: DC

## 2019-11-12 ENCOUNTER — Other Ambulatory Visit (HOSPITAL_COMMUNITY): Payer: Self-pay | Admitting: Internal Medicine

## 2019-11-12 DIAGNOSIS — I5022 Chronic systolic (congestive) heart failure: Secondary | ICD-10-CM

## 2019-11-26 ENCOUNTER — Other Ambulatory Visit (HOSPITAL_COMMUNITY): Payer: Self-pay | Admitting: Internal Medicine

## 2019-11-27 ENCOUNTER — Other Ambulatory Visit (HOSPITAL_COMMUNITY): Payer: Self-pay | Admitting: Internal Medicine

## 2019-12-06 ENCOUNTER — Other Ambulatory Visit (HOSPITAL_COMMUNITY): Payer: Self-pay | Admitting: Internal Medicine

## 2019-12-12 ENCOUNTER — Other Ambulatory Visit (HOSPITAL_COMMUNITY): Payer: Self-pay | Admitting: Internal Medicine

## 2019-12-12 DIAGNOSIS — I5022 Chronic systolic (congestive) heart failure: Secondary | ICD-10-CM

## 2019-12-17 ENCOUNTER — Telehealth (HOSPITAL_COMMUNITY): Payer: Self-pay | Admitting: Vascular Surgery

## 2019-12-17 NOTE — Telephone Encounter (Signed)
Left pt message to make 16yr f/u w/ db w/ echo in JAN or FEB

## 2019-12-19 ENCOUNTER — Other Ambulatory Visit (HOSPITAL_COMMUNITY): Payer: Self-pay | Admitting: Internal Medicine

## 2019-12-20 ENCOUNTER — Other Ambulatory Visit (HOSPITAL_COMMUNITY): Payer: Self-pay | Admitting: Internal Medicine

## 2019-12-24 ENCOUNTER — Telehealth (HOSPITAL_COMMUNITY): Payer: Self-pay | Admitting: Vascular Surgery

## 2019-12-24 NOTE — Telephone Encounter (Signed)
2nd attempt .Marland Kitchen Left pt message to make 40yr f/u w/ db w/ echo

## 2019-12-27 ENCOUNTER — Ambulatory Visit (INDEPENDENT_AMBULATORY_CARE_PROVIDER_SITE_OTHER): Payer: BC Managed Care – PPO | Admitting: *Deleted

## 2019-12-27 DIAGNOSIS — I5022 Chronic systolic (congestive) heart failure: Secondary | ICD-10-CM

## 2019-12-27 LAB — CUP PACEART REMOTE DEVICE CHECK
Battery Remaining Longevity: 36 mo
Battery Remaining Percentage: 44 %
Battery Voltage: 2.9 V
Brady Statistic AP VP Percent: 2.1 %
Brady Statistic AP VS Percent: 1 %
Brady Statistic AS VP Percent: 95 %
Brady Statistic AS VS Percent: 1.6 %
Brady Statistic RA Percent Paced: 1.5 %
Brady Statistic RV Percent Paced: 97 %
Date Time Interrogation Session: 20210114020017
HighPow Impedance: 73 Ohm
HighPow Impedance: 73 Ohm
Implantable Lead Implant Date: 20170426
Implantable Lead Implant Date: 20170426
Implantable Lead Implant Date: 20170426
Implantable Lead Location: 753858
Implantable Lead Location: 753859
Implantable Lead Location: 753860
Implantable Lead Model: 293
Implantable Lead Serial Number: 385378
Implantable Pulse Generator Implant Date: 20170426
Lead Channel Impedance Value: 390 Ohm
Lead Channel Impedance Value: 450 Ohm
Lead Channel Impedance Value: 680 Ohm
Lead Channel Pacing Threshold Amplitude: 0.5 V
Lead Channel Pacing Threshold Amplitude: 0.75 V
Lead Channel Pacing Threshold Amplitude: 1.125 V
Lead Channel Pacing Threshold Pulse Width: 0.4 ms
Lead Channel Pacing Threshold Pulse Width: 0.4 ms
Lead Channel Pacing Threshold Pulse Width: 0.8 ms
Lead Channel Sensing Intrinsic Amplitude: 2.9 mV
Lead Channel Sensing Intrinsic Amplitude: 8.6 mV
Lead Channel Setting Pacing Amplitude: 1.25 V
Lead Channel Setting Pacing Amplitude: 2.125
Lead Channel Setting Pacing Amplitude: 2.5 V
Lead Channel Setting Pacing Pulse Width: 0.4 ms
Lead Channel Setting Pacing Pulse Width: 0.8 ms
Lead Channel Setting Sensing Sensitivity: 0.5 mV
Pulse Gen Serial Number: 7307210

## 2019-12-28 NOTE — Progress Notes (Signed)
ICD remote 

## 2020-01-06 ENCOUNTER — Other Ambulatory Visit (HOSPITAL_COMMUNITY): Payer: Self-pay | Admitting: Internal Medicine

## 2020-01-06 DIAGNOSIS — I5022 Chronic systolic (congestive) heart failure: Secondary | ICD-10-CM

## 2020-01-17 ENCOUNTER — Other Ambulatory Visit (HOSPITAL_COMMUNITY): Payer: Self-pay | Admitting: Internal Medicine

## 2020-01-25 ENCOUNTER — Other Ambulatory Visit (HOSPITAL_COMMUNITY): Payer: Self-pay | Admitting: Internal Medicine

## 2020-01-25 DIAGNOSIS — I5022 Chronic systolic (congestive) heart failure: Secondary | ICD-10-CM

## 2020-01-25 NOTE — Telephone Encounter (Signed)
Electronic refill request received Multiple phone attempts have been made to contact office to schedule follow up Letter mailed 30 day supply given as last office visit 2019

## 2020-02-11 ENCOUNTER — Other Ambulatory Visit (HOSPITAL_COMMUNITY): Payer: Self-pay | Admitting: Internal Medicine

## 2020-02-11 DIAGNOSIS — I5022 Chronic systolic (congestive) heart failure: Secondary | ICD-10-CM

## 2020-02-12 NOTE — Telephone Encounter (Signed)
Please contact patient to schedule an appt w/ db has not been seen since 2019

## 2020-02-25 ENCOUNTER — Other Ambulatory Visit (HOSPITAL_COMMUNITY): Payer: Self-pay | Admitting: Internal Medicine

## 2020-02-25 DIAGNOSIS — I5022 Chronic systolic (congestive) heart failure: Secondary | ICD-10-CM

## 2020-03-08 ENCOUNTER — Other Ambulatory Visit (HOSPITAL_COMMUNITY): Payer: Self-pay | Admitting: Internal Medicine

## 2020-03-19 ENCOUNTER — Encounter (HOSPITAL_COMMUNITY): Payer: Self-pay | Admitting: Internal Medicine

## 2020-03-19 ENCOUNTER — Ambulatory Visit (HOSPITAL_BASED_OUTPATIENT_CLINIC_OR_DEPARTMENT_OTHER)
Admission: RE | Admit: 2020-03-19 | Discharge: 2020-03-19 | Disposition: A | Discharge status: Home / Self Care | Payer: BC Managed Care – PPO | Source: Ambulatory Visit | Attending: Internal Medicine | Admitting: Internal Medicine

## 2020-03-19 ENCOUNTER — Ambulatory Visit (HOSPITAL_COMMUNITY)
Admission: RE | Admit: 2020-03-19 | Discharge: 2020-03-19 | Disposition: A | Discharge status: Home / Self Care | Payer: BC Managed Care – PPO | Source: Ambulatory Visit | Attending: Internal Medicine | Admitting: Internal Medicine

## 2020-03-19 ENCOUNTER — Other Ambulatory Visit: Payer: Self-pay

## 2020-03-19 VITALS — BP 124/88 | HR 89 | Wt 281.4 lb

## 2020-03-19 DIAGNOSIS — I5043 Acute on chronic combined systolic (congestive) and diastolic (congestive) heart failure: Secondary | ICD-10-CM | POA: Diagnosis present

## 2020-03-19 DIAGNOSIS — I2510 Atherosclerotic heart disease of native coronary artery without angina pectoris: Secondary | ICD-10-CM | POA: Diagnosis not present

## 2020-03-19 DIAGNOSIS — Z7901 Long term (current) use of anticoagulants: Secondary | ICD-10-CM | POA: Insufficient documentation

## 2020-03-19 DIAGNOSIS — I5022 Chronic systolic (congestive) heart failure: Secondary | ICD-10-CM

## 2020-03-19 DIAGNOSIS — I447 Left bundle-branch block, unspecified: Secondary | ICD-10-CM | POA: Insufficient documentation

## 2020-03-19 DIAGNOSIS — J45909 Unspecified asthma, uncomplicated: Secondary | ICD-10-CM | POA: Insufficient documentation

## 2020-03-19 DIAGNOSIS — I10 Essential (primary) hypertension: Secondary | ICD-10-CM | POA: Diagnosis not present

## 2020-03-19 DIAGNOSIS — I110 Hypertensive heart disease with heart failure: Secondary | ICD-10-CM | POA: Diagnosis not present

## 2020-03-19 DIAGNOSIS — I428 Other cardiomyopathies: Secondary | ICD-10-CM | POA: Diagnosis not present

## 2020-03-19 DIAGNOSIS — Z79899 Other long term (current) drug therapy: Secondary | ICD-10-CM | POA: Insufficient documentation

## 2020-03-19 DIAGNOSIS — Z8249 Family history of ischemic heart disease and other diseases of the circulatory system: Secondary | ICD-10-CM | POA: Insufficient documentation

## 2020-03-19 DIAGNOSIS — Z7982 Long term (current) use of aspirin: Secondary | ICD-10-CM | POA: Diagnosis not present

## 2020-03-19 DIAGNOSIS — M109 Gout, unspecified: Secondary | ICD-10-CM | POA: Diagnosis not present

## 2020-03-19 DIAGNOSIS — E785 Hyperlipidemia, unspecified: Secondary | ICD-10-CM | POA: Diagnosis not present

## 2020-03-19 LAB — BASIC METABOLIC PANEL
Anion gap: 12 (ref 5–15)
BUN: 15 mg/dL (ref 6–20)
CO2: 25 mmol/L (ref 22–32)
Calcium: 9.6 mg/dL (ref 8.9–10.3)
Chloride: 104 mmol/L (ref 98–111)
Creatinine, Ser: 0.93 mg/dL (ref 0.61–1.24)
GFR calc Af Amer: >60 mL/min (ref >60)
GFR calc non Af Amer: >60 mL/min (ref >60)
Glucose, Bld: 183 mg/dL — ABNORMAL HIGH (ref 70–99)
Potassium: 4.6 mmol/L (ref 3.5–5.1)
Sodium: 141 mmol/L (ref 135–145)

## 2020-03-19 LAB — BRAIN NATRIURETIC PEPTIDE: B Natriuretic Peptide: 55.5 pg/mL (ref 0.0–100.0)

## 2020-03-19 NOTE — Patient Instructions (Signed)
Labs today We will only contact you if something comes back abnormal or we need to make some changes. Otherwise no news is good news!  Your physician recommends that you schedule a follow-up appointment in: 12 months with Dr Gala Romney. Please call our office in January 2022 to schedule this appointment.   Please call office at [phone] option 2 if you have any questions or concerns.   At the [facility], you and your health needs are our priority. As part of our continuing mission to provide you with exceptional heart care, we have created designated Provider Care Teams. These Care Teams include your primary Cardiologist (physician) and Advanced Practice Providers (APPs- Physician Assistants and Nurse Practitioners) who all work together to provide you with the care you need, when you need it.   You may see any of the following providers on your designated Care Team at your next follow up: Marland Kitchen Dr Arvilla Meres . Dr Marca Ancona . Tonye Becket, NP . Robbie Lis, PA . Karle Plumber, PharmD   Please be sure to bring in all your medications bottles to every appointment.

## 2020-03-19 NOTE — Addendum Note (Signed)
Encounter addended by: Marisa Hua, RN on: 03/19/2020 3:40 PM  Actions taken: Order list changed, Diagnosis association updated, Clinical Note Signed, Charge Capture section accepted

## 2020-03-19 NOTE — Progress Notes (Signed)
  Echocardiogram 2D Echocardiogram has been performed.  Alanni Vader A Tiann Saha 03/19/2020, 2:50 PM

## 2020-03-19 NOTE — Progress Notes (Signed)
ADVANCED HF CLINIC NOTE  Patient ID: Travis Cline, male   DOB: 01-28-1966, 54 y.o.   MRN: [id number]  PCP: Dr Cam Hai  Primary Cardiologist: Dr Graciela Husbands   HPI: Travis Cline is a 17 year old with a history of HTN, hyperlipidemia, obesity, LBBB, nonobstructive CAD and recently diagnosed systolic HF due to NICM caridomyopathy with EF 15% diagnosed 8/16. Cath with non-obstructive CAD.   Underwent St Jude CRT-D implant on 04/07/16   Today he presents for HF follow up. Feeling good. No CP or SOB. No edema. Back to working a Biochemist, clinical. No ICD firings. No problems with meds.   Echo 12/16 EF ~25%. RV ok  Echo 6/18 30-35%    EKG 07/21/15 NSR 92 bpm LBBB QRS 170   LHC 8/6   Prox LAD lesion, 35% stenosed.  Prox Cx to Mid Cx lesion, 30% stenosed.  There is severe left ventricular systolic dysfunction  ECHO 07/2015 -->EF 15%. RV mildly dilated. Peak PA pressure 54 mm hg   CXR  07/15/15 shows borderline heart size, mild vasc congestion, low lung vols and basilar atx...   PFT 06/17/15 showed FVC=3.90 (75%), FEV1=2.88 (72%), %1sec=74, mid-flows reduced at 58% predicted; post-bronchodil showed an 8% improvement in FEV1; LungVols showed TLC=6.07 (86%), RV=1.97 (97%), DLCO=99%; These are c/w some mild obstruction/ small airways disease...  Labs 07/16/2015 K 4.5 Creatinine 0.95  Hgb 17.3  Labs 09/12/2015: K 5.3 Creatinine 1.01   Labs 10/28/2015: K 54.7 Creatinine 0.93  SH:  Social History   Socioeconomic History  . Marital status: Married    Spouse name: Not on file  . Number of children: Not on file  . Years of education: Not on file  . Highest education level: Not on file  Occupational History  . Not on file  Tobacco Use  . Smoking status: Never Smoker  . Smokeless tobacco: Never Used  Substance and Sexual Activity  . Alcohol use: Yes    Alcohol/week: 1.0 - 2.0 standard drinks    Types: 1 - 2 Standard drinks or equivalent per week    Comment: was previously drinking 2-3  beers/day - currently drinking about 4 beers/month.  . Drug use: No  . Sexual activity: Yes  Other Topics Concern  . Not on file  Social History Narrative    He lives in [place] with his wife and 2 stepchildren. He has 2 children of his own who live in  [place]. He does not routinely exercise. 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   Social Determinants of Health   Financial Resource Strain:   . Difficulty of Paying Living Expenses:   Food Insecurity:   . Worried About Programme researcher, broadcasting/film/video in the Last Year:   . Barista in the Last Year:   Transportation Needs:   . Freight forwarder (Medical):   Marland Kitchen Lack of Transportation (Non-Medical):   Physical Activity:   . Days of Exercise per Week:   . Minutes of Exercise per Session:   Stress:   . Feeling of Stress :   Social Connections:   . Frequency of Communication with Friends and Family:   . Frequency of Social Gatherings with Friends and Family:   . Attends Religious Services:   . Active Member of Clubs or Organizations:   . Attends Banker Meetings:   Marland Kitchen Marital Status:   Intimate Partner Violence:   .  Fear of Current or Ex-Partner:   . Emotionally Abused:   Marland Kitchen Physically Abused:   . Sexually Abused:     FH:  Family History  Problem Relation Age of Onset  . Other Father        alive & well @ 25.  . Arthritis Father   . Other Mother        alive & well @ 38.  Marland Kitchen Heart disease Mother   . Other Unknown        siblings alive and well.    Past Medical History:  Diagnosis Date  . Asthma   . Cardiomyopathy (HCC)    a.  06/2015  echo: EF 15%, diffuse hypokinesis, anteroseptal/apical akinesis. Mild mitral regurgitation. Severely dilated left atrium. Mildly reduced RV function. ulnar artery systolic pressure 54 mmHg.  . CHF (congestive heart failure) (HCC)   . Erectile dysfunction   . Essential hypertension   . GERD  (gastroesophageal reflux disease)   . Gout   . Hyperlipemia   . Insomnia   . LBBB (left bundle branch block)   . Morbid obesity (HCC)     Current Outpatient Medications  Medication Sig Dispense Refill  . amLODipine (NORVASC) 5 MG tablet Take 1 tablet (5 mg total) by mouth daily. Needs appt for further refills 30 tablet 0  . aspirin EC 81 MG tablet Take 1 tablet (81 mg total) by mouth daily. 90 tablet 3  . carvedilol (COREG) 25 MG tablet TAKE 1 TABLET (25 MG TOTAL) BY MOUTH 2 (TWO) TIMES DAILY WITH A MEAL. 180 tablet 0  . cimetidine (TAGAMET) 200 MG tablet Take 200 mg by mouth 2 (two) times daily as needed. Reported on 02/24/2016    . colchicine 0.6 MG tablet Take 1 tablet (0.6 mg total) by mouth daily as needed. Reported on 04/23/2016 60 tablet 2  . ENTRESTO 97-103 MG TAKE 1 TABLET BY MOUTH TWICE A DAY *LAST REFILL WITHOUT OFFICE VISIT* 60 tablet 0  . eplerenone (INSPRA) 25 MG tablet TAKE 1 TABLET BY MOUTH EVERY DAY 90 tablet 1  . Multiple Vitamins-Minerals (MULTIVITAMIN WITH MINERALS) tablet Take 1 tablet by mouth daily.    . nitroGLYCERIN (NITROSTAT) 0.4 MG SL tablet Place 0.4 mg under the tongue every 5 (five) minutes as needed for chest pain.    . simvastatin (ZOCOR) 20 MG tablet Take 1 tablet (20 mg total) by mouth daily. 90 tablet 0  . torsemide (DEMADEX) 20 MG tablet TAKE 2 TABLETS (40 MG TOTAL) BY MOUTH DAILY 180 tablet 0  . ULORIC 40 MG tablet Take 40 mg by mouth daily.   3   No current facility-administered medications for this encounter.    Vitals:   03/19/20 1503  BP: 124/88  Pulse: 89  SpO2: 97%  Weight: 127.6 kg (281 lb 6.4 oz)   Filed Weights   03/19/20 1503  Weight: 127.6 kg (281 lb 6.4 oz)    PHYSICAL EXAM:  General:  Well appearing. No resp difficulty HEENT: normal Neck: supple. no JVD. Carotids 2+ bilat; no bruits. No lymphadenopathy or thryomegaly appreciated. Cor: PMI nondisplaced. Regular rate & rhythm. No rubs, gallops or murmurs. Lungs: clear  Abdomen: obese soft, nontender, nondistended. No hepatosplenomegaly. No bruits or masses. Good bowel sounds. Extremities: no cyanosis, clubbing, rash, edema Neuro: alert & orientedx3, cranial nerves grossly intact. moves all 4 extremities w/o difficulty. Affect pleasant  NSR 89 bivpacing. QRS Personally reviewed    ASSESSMENT & PLAN:  1. Chronic Systolic HF NICM. Had  LHC 8/8 with nonobstructive CAD.  S/p SJ CRT-D 4/17 - Now s/p CRT-D. - Doing well. NYHA I-II  - ECHO 12/16 EF 20-25% Septum dyskinetic from LBBB RV mildly hypokinetic.  - Echo 6/18 EF 30-35% - Echo today EF 45% Personally reviewed - Continue carvedilol 25 mg twice a day.  - Continue entresto 97/103 bid - Continue eplerenone  25 mg daily. (had painful gynecomastia with spiro)  - Blood work 12/20 K 4.5  - ICD interrogated in clinic: No VT/AF. 97% biv pacing.  Activity > 4hr/day fluid ok Personally reviewed 2. LBBB --s/p CTR-D.  3. HTN - Blood pressure well controlled. Continue current regimen. 4. Gout  - Continue current regimen per PCP 5. ETOH - Former heavy drinker. Rarely drinking alcohol.    Glori Bickers MD  3:22 PM

## 2020-03-20 ENCOUNTER — Telehealth (HOSPITAL_COMMUNITY): Payer: Self-pay | Admitting: Pharmacist

## 2020-03-20 NOTE — Telephone Encounter (Signed)
Advanced Heart Failure Patient Advocate Encounter  Prior Authorization for Sherryll Burger has been approved.    PA# [id number] Effective dates: 03/20/20 through 03/20/21  Karle Plumber, PharmD, BCPS, BCCP, CPP Heart Failure Clinic Pharmacist [phone]

## 2020-03-27 ENCOUNTER — Ambulatory Visit (INDEPENDENT_AMBULATORY_CARE_PROVIDER_SITE_OTHER): Payer: BC Managed Care – PPO | Admitting: *Deleted

## 2020-03-27 DIAGNOSIS — I5022 Chronic systolic (congestive) heart failure: Secondary | ICD-10-CM

## 2020-03-27 LAB — CUP PACEART REMOTE DEVICE CHECK
Battery Remaining Longevity: 34 mo
Battery Remaining Percentage: 42 %
Battery Voltage: 2.89 V
Brady Statistic AP VP Percent: 2.1 %
Brady Statistic AP VS Percent: 1 %
Brady Statistic AS VP Percent: 95 %
Brady Statistic AS VS Percent: 1.6 %
Brady Statistic RA Percent Paced: 1.5 %
Date Time Interrogation Session: 20210415060309
HighPow Impedance: 71 Ohm
HighPow Impedance: 71 Ohm
Implantable Lead Implant Date: 20170426
Implantable Lead Implant Date: 20170426
Implantable Lead Implant Date: 20170426
Implantable Lead Location: 753858
Implantable Lead Location: 753859
Implantable Lead Location: 753860
Implantable Lead Model: 293
Implantable Lead Serial Number: 385378
Implantable Pulse Generator Implant Date: 20170426
Lead Channel Impedance Value: 340 Ohm
Lead Channel Impedance Value: 450 Ohm
Lead Channel Impedance Value: 690 Ohm
Lead Channel Pacing Threshold Amplitude: 0.5 V
Lead Channel Pacing Threshold Amplitude: 0.75 V
Lead Channel Pacing Threshold Amplitude: 1.375 V
Lead Channel Pacing Threshold Pulse Width: 0.4 ms
Lead Channel Pacing Threshold Pulse Width: 0.4 ms
Lead Channel Pacing Threshold Pulse Width: 0.8 ms
Lead Channel Sensing Intrinsic Amplitude: 10.2 mV
Lead Channel Sensing Intrinsic Amplitude: 2.7 mV
Lead Channel Setting Pacing Amplitude: 1.25 V
Lead Channel Setting Pacing Amplitude: 2.375
Lead Channel Setting Pacing Amplitude: 2.5 V
Lead Channel Setting Pacing Pulse Width: 0.4 ms
Lead Channel Setting Pacing Pulse Width: 0.8 ms
Lead Channel Setting Sensing Sensitivity: 0.5 mV
Pulse Gen Serial Number: 7307210

## 2020-03-28 NOTE — Progress Notes (Signed)
ICD Remote  

## 2020-04-22 ENCOUNTER — Other Ambulatory Visit: Payer: Self-pay | Admitting: Internal Medicine

## 2020-05-03 ENCOUNTER — Other Ambulatory Visit (HOSPITAL_COMMUNITY): Payer: Self-pay | Admitting: Internal Medicine

## 2020-05-03 DIAGNOSIS — I5022 Chronic systolic (congestive) heart failure: Secondary | ICD-10-CM

## 2020-06-26 ENCOUNTER — Ambulatory Visit (INDEPENDENT_AMBULATORY_CARE_PROVIDER_SITE_OTHER): Payer: BC Managed Care – PPO | Admitting: *Deleted

## 2020-06-26 DIAGNOSIS — I5022 Chronic systolic (congestive) heart failure: Secondary | ICD-10-CM

## 2020-06-26 DIAGNOSIS — I428 Other cardiomyopathies: Secondary | ICD-10-CM | POA: Diagnosis not present

## 2020-06-26 LAB — CUP PACEART REMOTE DEVICE CHECK
Battery Remaining Longevity: 32 mo
Battery Remaining Percentage: 39 %
Battery Voltage: 2.89 V
Brady Statistic AP VP Percent: 2.2 %
Brady Statistic AP VS Percent: 1 %
Brady Statistic AS VP Percent: 95 %
Brady Statistic AS VS Percent: 1.7 %
Brady Statistic RA Percent Paced: 1.6 %
Date Time Interrogation Session: 20210715020017
HighPow Impedance: 78 Ohm
HighPow Impedance: 78 Ohm
Implantable Lead Implant Date: 20170426
Implantable Lead Implant Date: 20170426
Implantable Lead Implant Date: 20170426
Implantable Lead Location: 753858
Implantable Lead Location: 753859
Implantable Lead Location: 753860
Implantable Lead Model: 293
Implantable Lead Serial Number: 385378
Implantable Pulse Generator Implant Date: 20170426
Lead Channel Impedance Value: 400 Ohm
Lead Channel Impedance Value: 480 Ohm
Lead Channel Impedance Value: 760 Ohm
Lead Channel Pacing Threshold Amplitude: 0.5 V
Lead Channel Pacing Threshold Amplitude: 0.75 V
Lead Channel Pacing Threshold Amplitude: 1.25 V
Lead Channel Pacing Threshold Pulse Width: 0.4 ms
Lead Channel Pacing Threshold Pulse Width: 0.4 ms
Lead Channel Pacing Threshold Pulse Width: 0.8 ms
Lead Channel Sensing Intrinsic Amplitude: 10.1 mV
Lead Channel Sensing Intrinsic Amplitude: 3.4 mV
Lead Channel Setting Pacing Amplitude: 1.25 V
Lead Channel Setting Pacing Amplitude: 2.25 V
Lead Channel Setting Pacing Amplitude: 2.5 V
Lead Channel Setting Pacing Pulse Width: 0.4 ms
Lead Channel Setting Pacing Pulse Width: 0.8 ms
Lead Channel Setting Sensing Sensitivity: 0.5 mV
Pulse Gen Serial Number: 7307210

## 2020-06-27 NOTE — Progress Notes (Signed)
Remote ICD transmission.   

## 2020-07-22 ENCOUNTER — Other Ambulatory Visit (HOSPITAL_COMMUNITY): Payer: Self-pay

## 2020-07-22 MED ORDER — AMLODIPINE BESYLATE 5 MG PO TABS: 5.0000 mg | ORAL_TABLET | Freq: Every day | ORAL | 3 refills | Status: DC

## 2020-08-11 ENCOUNTER — Telehealth (HOSPITAL_COMMUNITY): Payer: Self-pay

## 2020-08-11 NOTE — Telephone Encounter (Signed)
Cardiac Clearance requested for Colonoscopy by [facility]  Per DB: "ok to proceed"  Faxed to [phone] on 8/30

## 2020-09-03 ENCOUNTER — Other Ambulatory Visit (HOSPITAL_COMMUNITY): Payer: Self-pay | Admitting: Internal Medicine

## 2020-09-25 ENCOUNTER — Ambulatory Visit (INDEPENDENT_AMBULATORY_CARE_PROVIDER_SITE_OTHER): Payer: BC Managed Care – PPO

## 2020-09-25 DIAGNOSIS — I428 Other cardiomyopathies: Secondary | ICD-10-CM

## 2020-09-25 LAB — CUP PACEART REMOTE DEVICE CHECK
Battery Remaining Longevity: 30 mo
Battery Remaining Percentage: 36 %
Battery Voltage: 2.89 V
Brady Statistic AP VP Percent: 2.3 %
Brady Statistic AP VS Percent: 1 %
Brady Statistic AS VP Percent: 95 %
Brady Statistic AS VS Percent: 1.8 %
Brady Statistic RA Percent Paced: 1.7 %
Date Time Interrogation Session: 20211014020017
HighPow Impedance: 82 Ohm
HighPow Impedance: 82 Ohm
Implantable Lead Implant Date: 20170426
Implantable Lead Implant Date: 20170426
Implantable Lead Implant Date: 20170426
Implantable Lead Location: 753858
Implantable Lead Location: 753859
Implantable Lead Location: 753860
Implantable Lead Model: 293
Implantable Lead Serial Number: 385378
Implantable Pulse Generator Implant Date: 20170426
Lead Channel Impedance Value: 400 Ohm
Lead Channel Impedance Value: 490 Ohm
Lead Channel Impedance Value: 800 Ohm
Lead Channel Pacing Threshold Amplitude: 0.5 V
Lead Channel Pacing Threshold Amplitude: 0.75 V
Lead Channel Pacing Threshold Amplitude: 1.125 V
Lead Channel Pacing Threshold Pulse Width: 0.4 ms
Lead Channel Pacing Threshold Pulse Width: 0.4 ms
Lead Channel Pacing Threshold Pulse Width: 0.8 ms
Lead Channel Sensing Intrinsic Amplitude: 11.6 mV
Lead Channel Sensing Intrinsic Amplitude: 3 mV
Lead Channel Setting Pacing Amplitude: 1.25 V
Lead Channel Setting Pacing Amplitude: 2.125
Lead Channel Setting Pacing Amplitude: 2.5 V
Lead Channel Setting Pacing Pulse Width: 0.4 ms
Lead Channel Setting Pacing Pulse Width: 0.8 ms
Lead Channel Setting Sensing Sensitivity: 0.5 mV
Pulse Gen Serial Number: 7307210

## 2020-09-30 NOTE — Progress Notes (Signed)
Remote ICD transmission.   

## 2020-10-01 ENCOUNTER — Other Ambulatory Visit: Payer: Self-pay | Admitting: Gastroenterology

## 2020-10-29 ENCOUNTER — Encounter (HOSPITAL_COMMUNITY): Payer: Self-pay | Admitting: Gastroenterology

## 2020-10-29 ENCOUNTER — Other Ambulatory Visit: Payer: Self-pay

## 2020-11-08 ENCOUNTER — Inpatient Hospital Stay (HOSPITAL_COMMUNITY): Admission: RE | Admit: 2020-11-08 | Payer: BC Managed Care – PPO | Source: Ambulatory Visit

## 2020-11-10 ENCOUNTER — Other Ambulatory Visit (HOSPITAL_COMMUNITY)
Admission: RE | Admit: 2020-11-10 | Discharge: 2020-11-10 | Disposition: A | Discharge status: Home / Self Care | Payer: BC Managed Care – PPO | Source: Ambulatory Visit | Attending: Gastroenterology | Admitting: Gastroenterology

## 2020-11-10 DIAGNOSIS — Z01812 Encounter for preprocedural laboratory examination: Secondary | ICD-10-CM | POA: Insufficient documentation

## 2020-11-10 DIAGNOSIS — K5730 Diverticulosis of large intestine without perforation or abscess without bleeding: Secondary | ICD-10-CM | POA: Diagnosis not present

## 2020-11-10 DIAGNOSIS — Z95810 Presence of automatic (implantable) cardiac defibrillator: Secondary | ICD-10-CM | POA: Diagnosis not present

## 2020-11-10 DIAGNOSIS — Z7982 Long term (current) use of aspirin: Secondary | ICD-10-CM | POA: Diagnosis not present

## 2020-11-10 DIAGNOSIS — Z20822 Contact with and (suspected) exposure to covid-19: Secondary | ICD-10-CM | POA: Insufficient documentation

## 2020-11-10 DIAGNOSIS — Z1211 Encounter for screening for malignant neoplasm of colon: Secondary | ICD-10-CM | POA: Diagnosis not present

## 2020-11-10 DIAGNOSIS — K648 Other hemorrhoids: Secondary | ICD-10-CM | POA: Diagnosis not present

## 2020-11-10 DIAGNOSIS — Z79899 Other long term (current) drug therapy: Secondary | ICD-10-CM | POA: Diagnosis not present

## 2020-11-10 LAB — SARS CORONAVIRUS 2 (TAT 6-24 HRS): SARS Coronavirus 2: NEGATIVE

## 2020-11-11 NOTE — Anesthesia Preprocedure Evaluation (Addendum)
Anesthesia Evaluation  Patient identified by MRN, date of birth, ID band Patient awake    Reviewed: Allergy & Precautions, NPO status , Patient's Chart, lab work & pertinent test results  Airway Mallampati: II  TM Distance: >3 FB Neck ROM: Full    Dental no notable dental hx.    Pulmonary neg pulmonary ROS,    Pulmonary exam normal breath sounds clear to auscultation       Cardiovascular hypertension, +CHF  Normal cardiovascular exam+ pacemaker  Rhythm:Regular Rate:Normal  03/2020  . Left ventricular ejection fraction, by estimation, is 45 to 50%. Left  ventricular ejection fraction by 2D MOD biplane is 46.7 %. The left  ventricle has mildly decreased function. The left ventricle demonstrates  global hypokinesis. Left ventricular  diastolic parameters are consistent with Grade I diastolic dysfunction  (impaired relaxation).  2. Right ventricular systolic function is mildly reduced. The right  ventricular size is normal. Tricuspid regurgitation signal is inadequate  for assessing PA pressure.  3. The mitral valve is normal in structure. No evidence of mitral valve  regurgitation.  4. The aortic valve is tricuspid. Aortic valve regurgitation is not  visualized. No aortic stenosis is present.    Neuro/Psych negative neurological ROS  negative psych ROS   GI/Hepatic negative GI ROS, Neg liver ROS,   Endo/Other  Morbid obesity  Renal/GU negative Renal ROS  negative genitourinary   Musculoskeletal negative musculoskeletal ROS (+)   Abdominal   Peds negative pediatric ROS (+)  Hematology negative hematology ROS (+)   Anesthesia Other Findings   Reproductive/Obstetrics negative OB ROS                           Anesthesia Physical Anesthesia Plan  ASA: III  Anesthesia Plan: MAC   Post-op Pain Management:    Induction: Intravenous  PONV Risk Score and Plan: 0  Airway Management  Planned: Simple Face Mask  Additional Equipment:   Intra-op Plan:   Post-operative Plan:   Informed Consent: I have reviewed the patients History and Physical, chart, labs and discussed the procedure including the risks, benefits and alternatives for the proposed anesthesia with the patient or authorized representative who has indicated his/her understanding and acceptance.     Dental advisory given  Plan Discussed with: CRNA and Surgeon  Anesthesia Plan Comments:        Anesthesia Quick Evaluation

## 2020-11-12 ENCOUNTER — Ambulatory Visit (HOSPITAL_COMMUNITY)
Admission: RE | Admit: 2020-11-12 | Discharge: 2020-11-12 | Disposition: A | Discharge status: Home / Self Care | Payer: BC Managed Care – PPO | Attending: Gastroenterology | Admitting: Gastroenterology

## 2020-11-12 ENCOUNTER — Other Ambulatory Visit: Payer: Self-pay

## 2020-11-12 ENCOUNTER — Encounter (HOSPITAL_COMMUNITY): Payer: Self-pay | Admitting: Gastroenterology

## 2020-11-12 ENCOUNTER — Ambulatory Visit (HOSPITAL_COMMUNITY): Payer: BC Managed Care – PPO | Admitting: Anesthesiology

## 2020-11-12 ENCOUNTER — Encounter (HOSPITAL_COMMUNITY)
Admission: RE | Disposition: A | Discharge status: Home / Self Care | Payer: Self-pay | Source: Home / Self Care | Attending: Gastroenterology

## 2020-11-12 DIAGNOSIS — Z7982 Long term (current) use of aspirin: Secondary | ICD-10-CM | POA: Insufficient documentation

## 2020-11-12 DIAGNOSIS — Z20822 Contact with and (suspected) exposure to covid-19: Secondary | ICD-10-CM | POA: Insufficient documentation

## 2020-11-12 DIAGNOSIS — Z1211 Encounter for screening for malignant neoplasm of colon: Secondary | ICD-10-CM | POA: Insufficient documentation

## 2020-11-12 DIAGNOSIS — Z95810 Presence of automatic (implantable) cardiac defibrillator: Secondary | ICD-10-CM | POA: Insufficient documentation

## 2020-11-12 DIAGNOSIS — K648 Other hemorrhoids: Secondary | ICD-10-CM | POA: Insufficient documentation

## 2020-11-12 DIAGNOSIS — Z79899 Other long term (current) drug therapy: Secondary | ICD-10-CM | POA: Insufficient documentation

## 2020-11-12 DIAGNOSIS — K5730 Diverticulosis of large intestine without perforation or abscess without bleeding: Secondary | ICD-10-CM | POA: Insufficient documentation

## 2020-11-12 HISTORY — PX: COLONOSCOPY WITH PROPOFOL: SHX5780

## 2020-11-12 HISTORY — DX: Presence of spectacles and contact lenses: Z97.3

## 2020-11-12 SURGERY — COLONOSCOPY WITH PROPOFOL
Anesthesia: Monitor Anesthesia Care

## 2020-11-12 MED ORDER — LIDOCAINE 2% (20 MG/ML) 5 ML SYRINGE
INTRAMUSCULAR | Status: DC | PRN
  Administered 2020-11-12 (×2): 40 mg via INTRAVENOUS

## 2020-11-12 MED ORDER — PROPOFOL 10 MG/ML IV BOLUS
INTRAVENOUS | Status: DC | PRN
  Administered 2020-11-12 (×6): 50 mg via INTRAVENOUS

## 2020-11-12 MED ORDER — LACTATED RINGERS IV SOLN
INTRAVENOUS | Status: DC
  Administered 2020-11-12: 1000 mL via INTRAVENOUS

## 2020-11-12 MED ORDER — SODIUM CHLORIDE 0.9 % IV SOLN: INTRAVENOUS | Status: DC

## 2020-11-12 MED ORDER — PROPOFOL 500 MG/50ML IV EMUL
INTRAVENOUS | Status: AC
  Filled 2020-11-12: qty 350

## 2020-11-12 SURGICAL SUPPLY — 21 items

## 2020-11-12 NOTE — Discharge Instructions (Signed)
YOU HAD AN ENDOSCOPIC PROCEDURE TODAY: Refer to the procedure report and other information in the discharge instructions given to you for any specific questions about what was found during the examination. If this information does not answer your questions, please call [facility] office at [phone] to clarify.   YOU SHOULD EXPECT: Some feelings of bloating in the abdomen. Passage of more gas than usual. Walking can help get rid of the air that was put into your GI tract during the procedure and reduce the bloating. If you had a lower endoscopy (such as a colonoscopy or flexible sigmoidoscopy) you may notice spotting of blood in your stool or on the toilet paper. Some abdominal soreness may be present for a day or two, also.  DIET: Your first meal following the procedure should be a light meal and then it is ok to progress to your normal diet. A half-sandwich or bowl of soup is an example of a good first meal. Heavy or fried foods are harder to digest and may make you feel nauseous or bloated. Drink plenty of fluids but you should avoid alcoholic beverages for 24 hours. If you had a esophageal dilation, please see attached instructions for diet.    ACTIVITY: Your care partner should take you home directly after the procedure. You should plan to take it easy, moving slowly for the rest of the day. You can resume normal activity the day after the procedure however YOU SHOULD NOT DRIVE, use power tools, machinery or perform tasks that involve climbing or major physical exertion for 24 hours (because of the sedation medicines used during the test).   SYMPTOMS TO REPORT IMMEDIATELY: A gastroenterologist can be reached at any hour. Please call [phone]  for any of the following symptoms:  . Following lower endoscopy (colonoscopy, flexible sigmoidoscopy) Excessive amounts of blood in the stool  Significant tenderness, worsening of abdominal pains  Swelling of the abdomen that is new, acute  Fever of 100  or higher  . Following upper endoscopy (EGD, EUS, ERCP, esophageal dilation) Vomiting of blood or coffee ground material  New, significant abdominal pain  New, significant chest pain or pain under the shoulder blades  Painful or persistently difficult swallowing  New shortness of breath  Black, tarry-looking or red, bloody stools  FOLLOW UP:  If any biopsies were taken you will be contacted by phone or by letter within the next 1-3 weeks. Call [phone]  if you have not heard about the biopsies in 3 weeks.  Please also call with any specific questions about appointments or follow up tests. Colonoscopy, Adult, Care After This sheet gives you information about how to care for yourself after your procedure. Your doctor may also give you more specific instructions. If you have problems or questions, call your doctor. What can I expect after the procedure? After the procedure, it is common to have:  A small amount of blood in your poop (stool) for 24 hours.  Some gas.  Mild cramping or bloating in your belly (abdomen). Follow these instructions at home: Eating and drinking   Drink enough fluid to keep your pee (urine) pale yellow.  Follow instructions from your doctor about what you cannot eat or drink.  Return to your normal diet as told by your doctor. Avoid heavy or fried foods that are hard to digest. Activity  Rest as told by your doctor.  Do not sit for a long time without moving. Get up to take short walks every 1-2 hours. This  is important. Ask for help if you feel weak or unsteady.  Return to your normal activities as told by your doctor. Ask your doctor what activities are safe for you. To help cramping and bloating:   Try walking around.  Put heat on your belly as told by your doctor. Use the heat source that your doctor recommends, such as a moist heat pack or a heating pad. ? Put a towel between your skin and the heat source. ? Leave the heat on for 20-30  minutes. ? Remove the heat if your skin turns bright red. This is very important if you are unable to feel pain, heat, or cold. You may have a greater risk of getting burned. General instructions  For the first 24 hours after the procedure: ? Do not drive or use machinery. ? Do not sign important documents. ? Do not drink alcohol. ? Do your daily activities more slowly than normal. ? Eat foods that are soft and easy to digest.  Take over-the-counter or prescription medicines only as told by your doctor.  Keep all follow-up visits as told by your doctor. This is important. Contact a doctor if:  You have blood in your poop 2-3 days after the procedure. Get help right away if:  You have more than a small amount of blood in your poop.  You see large clumps of tissue (blood clots) in your poop.  Your belly is swollen.  You feel like you may vomit (nauseous).  You vomit.  You have a fever.  You have belly pain that gets worse, and medicine does not help your pain. Summary  After the procedure, it is common to have a small amount of blood in your poop. You may also have mild cramping and bloating in your belly.  For the first 24 hours after the procedure, do not drive or use machinery, do not sign important documents, and do not drink alcohol.  Get help right away if you have a lot of blood in your poop, feel like you may vomit, have a fever, or have more belly pain. This information is not intended to replace advice given to you by your health care provider. Make sure you discuss any questions you have with your health care provider. Document Revised: 06/25/2019 Document Reviewed: 06/25/2019 Elsevier Patient Education  2020 [employer].

## 2020-11-12 NOTE — H&P (Signed)
[facility] Admission Note  Chief Complaint: colon cancer screening.  HPI: Travis Cline is an 54 y.o. male.  Seen today for colonoscopy for average-risk colon cancer screening.  No symptoms.  Being done in hospital due to cardiac comorbidities.  No prior colonoscopy.  No known family history of colon cancer or polyps.  Past Medical History:  Diagnosis Date  . Asthma   . Cardiomyopathy (HCC)    a.  06/2015  echo: EF 15%, diffuse hypokinesis, anteroseptal/apical akinesis. Mild mitral regurgitation. Severely dilated left atrium. Mildly reduced RV function. ulnar artery systolic pressure 54 mmHg.  . CHF (congestive heart failure) (HCC)   . Erectile dysfunction   . Essential hypertension   . GERD (gastroesophageal reflux disease)   . Gout   . Hyperlipemia   . Insomnia   . LBBB (left bundle branch block)   . Morbid obesity (HCC)   . Wears glasses     Past Surgical History:  Procedure Laterality Date  . CARDIAC CATHETERIZATION N/A 07/21/2015   Procedure: Left Heart Cath and Coronary Angiography;  Surgeon: Lyn Records, MD;  Location: [facility] INVASIVE CV LAB;  Service: Cardiovascular;  Laterality: N/A;  . EP IMPLANTABLE DEVICE  04/07/2016   bvi  . EP IMPLANTABLE DEVICE N/A 04/07/2016   Procedure: BiV ICD Insertion CRT-D;  Surgeon: Duke Salvia, MD;  Location: [facility] INVASIVE CV LAB;  Service: Cardiovascular;  Laterality: N/A;  . HERNIA REPAIR    . KNEE ARTHROSCOPY Right 11/17/2016   Procedure: RIGHT ARTHROSCOPY KNEE WITH MEDIAL MENISECTOMY;  Surgeon: Gean Birchwood, MD;  Location: [facility] OR;  Service: Orthopedics;  Laterality: Right;  . STERIOD INJECTION Left 11/17/2016   Procedure: STEROID INJECTION left knee;  Surgeon: Gean Birchwood, MD;  Location: [facility] OR;  Service: Orthopedics;  Laterality: Left;    Medications Prior to Admission  Medication Sig Dispense Refill  . allopurinol (ZYLOPRIM) 300 MG tablet Take 300 mg by mouth daily.    Marland Kitchen amLODipine (NORVASC) 5 MG tablet Take 1 tablet (5 mg  total) by mouth daily. 90 tablet 3  . aspirin EC 81 MG tablet Take 1 tablet (81 mg total) by mouth daily. 90 tablet 3  . carvedilol (COREG) 25 MG tablet TAKE 1 TABLET (25 MG TOTAL) BY MOUTH 2 (TWO) TIMES DAILY WITH A MEAL. 180 tablet 0  . cimetidine (TAGAMET) 200 MG tablet Take 200 mg by mouth 2 (two) times daily as needed (Heartburn).     Marland Kitchen eplerenone (INSPRA) 25 MG tablet TAKE 1 TABLET BY MOUTH EVERY DAY (Patient taking differently: Take 25 mg by mouth daily. ) 90 tablet 3  . mometasone (ELOCON) 0.1 % cream Apply 1 application topically daily as needed (Skin rash).     . Multiple Vitamins-Minerals (MULTIVITAMIN WITH MINERALS) tablet Take 1 tablet by mouth daily.    . Omega-3 Fatty Acids (OMEGA-3 FISH OIL) 1200 MG CAPS Take 1,200 mg by mouth daily.    . sacubitril-valsartan (ENTRESTO) 97-103 MG Take 1 tablet by mouth 2 (two) times daily. 180 tablet 3  . simvastatin (ZOCOR) 20 MG tablet Take 1 tablet (20 mg total) by mouth daily. 90 tablet 0  . torsemide (DEMADEX) 20 MG tablet TAKE 2 TABLETS (40 MG TOTAL) BY MOUTH DAILY (Patient taking differently: Take 40 mg by mouth daily. ) 180 tablet 3  . nitroGLYCERIN (NITROSTAT) 0.4 MG SL tablet Place 0.4 mg under the tongue every 5 (five) minutes as needed for chest pain.      Allergies: No Known Allergies  Family History  Problem Relation Age of Onset  . Other Father        alive & well @ 71.  . Arthritis Father   . Other Mother        alive & well @ 47.  Marland Kitchen Heart disease Mother   . Other Other        siblings alive and well.    Social History:  reports that he has never smoked. He has never used smokeless tobacco. He reports current alcohol use of about 1.0 - 2.0 standard drink of alcohol per week. He reports that he does not use drugs.   ROS: As per HPI, all others negative   Blood pressure (!) 137/94, pulse 91, temperature 98.8 F (37.1 C), temperature source Oral, resp. rate 13, height 5\' 10"  (1.778 m), weight 127 kg, SpO2 97 %. General  appearance: NAD, overweight HEENT:  NCAT, anicteric sclera ABD:  Protuberant  No results found for this or any previous visit (from the past 48 hour(s)). No results found.  Assessment/Plan   1.  Colon cancer screening. 2.  Risks (bleeding, infection, bowel perforation that could require surgery, sedation-related changes in cardiopulmonary systems), benefits (identification and possible treatment of source of symptoms, exclusion of certain causes of symptoms), and alternatives (watchful waiting, radiographic imaging studies, empiric medical treatment) of colonoscopy were explained to patient/family in detail and patient wishes to proceed. 3.  Idiopathic cardiomyopathy. 4.  AICD.  11/12/2020, 8:41 AM

## 2020-11-12 NOTE — Op Note (Signed)
[facility] Patient Name: Travis Cline Procedure Date: 11/12/2020 MRN: [id number] Attending MD: Willis Modena , MD Date of Birth: 1965/12/26 CSN: [id number] Age: 54 Admit Type: Inpatient Procedure:                Colonoscopy Indications:              Screening for colorectal malignant neoplasm, This                            is the patient's first colonoscopy Providers:                Willis Modena, MD, Dwain Sarna, RN, Rozetta Nunnery, Technician, Brion Aliment, Technician Referring MD:             Dr. Lupita Raider Medicines:                Monitored Anesthesia Care Complications:            No immediate complications. Estimated Blood Loss:     Estimated blood loss: none. Procedure:                Pre-Anesthesia Assessment:                           - Prior to the procedure, a History and Physical                            was performed, and patient medications and                            allergies were reviewed. The patient's tolerance of                            previous anesthesia was also reviewed. The risks                            and benefits of the procedure and the sedation                            options and risks were discussed with the patient.                            All questions were answered, and informed consent                            was obtained. Prior Anticoagulants: The patient has                            taken no previous anticoagulant or antiplatelet                            agents except for aspirin. ASA Grade Assessment:  III - A patient with severe systemic disease. After                            reviewing the risks and benefits, the patient was                            deemed in satisfactory condition to undergo the                            procedure.                           After obtaining informed consent, the colonoscope                             was passed under direct vision. Throughout the                            procedure, the patient's blood pressure, pulse, and                            oxygen saturations were monitored continuously. The                            CF-HQ190L ([id number]) Olympus colonoscope was                            introduced through the anus and advanced to the the                            cecum, identified by appendiceal orifice and                            ileocecal valve. The ileocecal valve, appendiceal                            orifice, and rectum were photographed. The entire                            colon was examined. The colonoscopy was performed                            without difficulty. The patient tolerated the                            procedure well. The quality of the bowel                            preparation was fair. Scope In: 8:51:15 AM Scope Out: 9:05:53 AM Scope Withdrawal Time: 0 hours 10 minutes 27 seconds  Total Procedure Duration: 0 hours 14 minutes 38 seconds  Findings:      The perianal and digital rectal examinations were normal.      Internal hemorrhoids were found during retroflexion. The hemorrhoids  were mild.      No additional abnormalities were found on retroflexion.      Bowel prep diffusely fair; thick viscous dark covering obscured some       mucosal views throughout colon, despite extensive washing; diminutive or       subtle sessile polyps could easily have been missed.      Many medium-mouthed diverticula were found in the sigmoid colon,       descending colon and transverse colon.      Colon otherwise normal; no other polyps, masses, vascular ectasias, or       inflammatory changes were seen. Impression:               - Preparation of the colon was fair.                           - Internal hemorrhoids.                           - Diverticulosis in the sigmoid colon, in the                            descending colon and in the  transverse colon.                           - Colonoscopy otherwise normal to cecum. Moderate Sedation:      Not Applicable - Patient had care per Anesthesia. Recommendation:           - Patient has a contact number available for                            emergencies. The signs and symptoms of potential                            delayed complications were discussed with the                            patient. Return to normal activities tomorrow.                            Written discharge instructions were provided to the                            patient.                           - Discharge patient to home (via wheelchair).                           - High fiber diet indefinitely.                           - Continue present medications.                           - Repeat colonoscopy in 5 years for screening  purposes, consider two-day bowel preparation.                           - Return to GI clinic PRN.                           - Return to referring physician as previously                            scheduled. Procedure Code(s):        --- Professional ---                           [phone], Colonoscopy, flexible; diagnostic, including                            collection of specimen(s) by brushing or washing,                            when performed (separate procedure) Diagnosis Code(s):        --- Professional ---                           Z12.11, Encounter for screening for malignant                            neoplasm of colon                           K64.8, Other hemorrhoids                           K57.30, Diverticulosis of large intestine without                            perforation or abscess without bleeding CPT copyright 2019 American Medical Association. All rights reserved. The codes documented in this report are preliminary and upon coder review may  be revised to meet current compliance requirements. Willis Modena, MD 11/12/2020  9:13:40 AM This report has been signed electronically. Number of Addenda: 0

## 2020-11-12 NOTE — Anesthesia Procedure Notes (Signed)
Procedure Name: MAC Date/Time: 11/12/2020 8:41 AM Performed by: Cynda Familia, CRNA Pre-anesthesia Checklist: Patient identified, Emergency Drugs available, Suction available, Patient being monitored and Timeout performed Patient Re-evaluated:Patient Re-evaluated prior to induction Oxygen Delivery Method: Simple face mask Placement Confirmation: positive ETCO2 and breath sounds checked- equal and bilateral Dental Injury: Teeth and Oropharynx as per pre-operative assessment

## 2020-11-12 NOTE — Anesthesia Postprocedure Evaluation (Signed)
Anesthesia Post Note  Patient: Travis Cline  Procedure(s) Performed: COLONOSCOPY WITH PROPOFOL (N/A )     Patient location during evaluation: PACU Anesthesia Type: MAC Level of consciousness: awake and alert Pain management: pain level controlled Vital Signs Assessment: post-procedure vital signs reviewed and stable Respiratory status: spontaneous breathing, nonlabored ventilation, respiratory function stable and patient connected to nasal cannula oxygen Cardiovascular status: stable and blood pressure returned to baseline Postop Assessment: no apparent nausea or vomiting Anesthetic complications: no   No complications documented.  Last Vitals:  Vitals:   11/12/20 0920 11/12/20 0930  BP: (!) 141/91 (!) 138/104  Pulse: 82 83  Resp: 14 12  Temp:    SpO2: 97% 97%    Last Pain:  Vitals:   11/12/20 0930  TempSrc:   PainSc: 0-No pain                 Kirsti Mcalpine S

## 2020-11-12 NOTE — Transfer of Care (Signed)
Immediate Anesthesia Transfer of Care Note  Patient: Travis Cline  Procedure(s) Performed: COLONOSCOPY WITH PROPOFOL (N/A )  Patient Location: PACU and Endoscopy Unit  Anesthesia Type:MAC  Level of Consciousness: awake and alert   Airway & Oxygen Therapy: Patient Spontanous Breathing and Patient connected to face mask oxygen  Post-op Assessment: Report given to RN and Post -op Vital signs reviewed and stable  Post vital signs: Reviewed and stable  Last Vitals:  Vitals Value Taken Time  BP    Temp    Pulse    Resp    SpO2      Last Pain:  Vitals:   11/12/20 0747  TempSrc: Oral  PainSc: 0-No pain         Complications: No complications documented.

## 2020-11-13 ENCOUNTER — Encounter (HOSPITAL_COMMUNITY): Payer: Self-pay | Admitting: Gastroenterology

## 2020-12-25 ENCOUNTER — Ambulatory Visit (INDEPENDENT_AMBULATORY_CARE_PROVIDER_SITE_OTHER): Payer: BC Managed Care – PPO

## 2020-12-25 DIAGNOSIS — I428 Other cardiomyopathies: Secondary | ICD-10-CM

## 2020-12-25 LAB — CUP PACEART REMOTE DEVICE CHECK
Battery Remaining Longevity: 29 mo
Battery Remaining Percentage: 33 %
Battery Voltage: 2.89 V
Brady Statistic AP VP Percent: 2.3 %
Brady Statistic AP VS Percent: 1 %
Brady Statistic AS VP Percent: 95 %
Brady Statistic AS VS Percent: 1.9 %
Brady Statistic RA Percent Paced: 1.6 %
Date Time Interrogation Session: 20220113023130
HighPow Impedance: 78 Ohm
HighPow Impedance: 78 Ohm
Implantable Lead Implant Date: 20170426
Implantable Lead Implant Date: 20170426
Implantable Lead Implant Date: 20170426
Implantable Lead Location: 753858
Implantable Lead Location: 753859
Implantable Lead Location: 753860
Implantable Lead Model: 293
Implantable Lead Serial Number: 385378
Implantable Pulse Generator Implant Date: 20170426
Lead Channel Impedance Value: 490 Ohm
Lead Channel Impedance Value: 530 Ohm
Lead Channel Impedance Value: 860 Ohm
Lead Channel Pacing Threshold Amplitude: 0.5 V
Lead Channel Pacing Threshold Amplitude: 0.75 V
Lead Channel Pacing Threshold Amplitude: 1.25 V
Lead Channel Pacing Threshold Pulse Width: 0.4 ms
Lead Channel Pacing Threshold Pulse Width: 0.4 ms
Lead Channel Pacing Threshold Pulse Width: 0.8 ms
Lead Channel Sensing Intrinsic Amplitude: 3.5 mV
Lead Channel Sensing Intrinsic Amplitude: 9.4 mV
Lead Channel Setting Pacing Amplitude: 1.25 V
Lead Channel Setting Pacing Amplitude: 2.25 V
Lead Channel Setting Pacing Amplitude: 2.5 V
Lead Channel Setting Pacing Pulse Width: 0.4 ms
Lead Channel Setting Pacing Pulse Width: 0.8 ms
Lead Channel Setting Sensing Sensitivity: 0.5 mV
Pulse Gen Serial Number: 7307210

## 2021-01-01 ENCOUNTER — Other Ambulatory Visit (HOSPITAL_COMMUNITY): Payer: Self-pay | Admitting: Internal Medicine

## 2021-01-07 NOTE — Progress Notes (Signed)
Remote ICD transmission.   

## 2021-03-26 ENCOUNTER — Ambulatory Visit (INDEPENDENT_AMBULATORY_CARE_PROVIDER_SITE_OTHER): Payer: BC Managed Care – PPO

## 2021-03-26 DIAGNOSIS — I5022 Chronic systolic (congestive) heart failure: Secondary | ICD-10-CM

## 2021-03-26 DIAGNOSIS — I428 Other cardiomyopathies: Secondary | ICD-10-CM

## 2021-03-27 LAB — CUP PACEART REMOTE DEVICE CHECK
Battery Remaining Longevity: 26 mo
Battery Remaining Percentage: 32 %
Battery Voltage: 2.87 V
Brady Statistic AP VP Percent: 2.3 %
Brady Statistic AP VS Percent: 1 %
Brady Statistic AS VP Percent: 95 %
Brady Statistic AS VS Percent: 2 %
Brady Statistic RA Percent Paced: 1.6 %
Date Time Interrogation Session: 20220414020016
HighPow Impedance: 82 Ohm
HighPow Impedance: 82 Ohm
Implantable Lead Implant Date: 20170426
Implantable Lead Implant Date: 20170426
Implantable Lead Implant Date: 20170426
Implantable Lead Location: 753858
Implantable Lead Location: 753859
Implantable Lead Location: 753860
Implantable Lead Model: 293
Implantable Lead Serial Number: 385378
Implantable Pulse Generator Implant Date: 20170426
Lead Channel Impedance Value: 400 Ohm
Lead Channel Impedance Value: 510 Ohm
Lead Channel Impedance Value: 830 Ohm
Lead Channel Pacing Threshold Amplitude: 0.5 V
Lead Channel Pacing Threshold Amplitude: 0.75 V
Lead Channel Pacing Threshold Amplitude: 1.125 V
Lead Channel Pacing Threshold Pulse Width: 0.4 ms
Lead Channel Pacing Threshold Pulse Width: 0.4 ms
Lead Channel Pacing Threshold Pulse Width: 0.8 ms
Lead Channel Sensing Intrinsic Amplitude: 3.2 mV
Lead Channel Sensing Intrinsic Amplitude: 9.8 mV
Lead Channel Setting Pacing Amplitude: 1.25 V
Lead Channel Setting Pacing Amplitude: 2.125
Lead Channel Setting Pacing Amplitude: 2.5 V
Lead Channel Setting Pacing Pulse Width: 0.4 ms
Lead Channel Setting Pacing Pulse Width: 0.8 ms
Lead Channel Setting Sensing Sensitivity: 0.5 mV
Pulse Gen Serial Number: 7307210

## 2021-04-10 NOTE — Progress Notes (Signed)
Remote ICD transmission.   

## 2021-05-04 ENCOUNTER — Other Ambulatory Visit (HOSPITAL_COMMUNITY): Payer: Self-pay | Admitting: Internal Medicine

## 2021-05-11 ENCOUNTER — Other Ambulatory Visit (HOSPITAL_COMMUNITY): Payer: Self-pay | Admitting: Internal Medicine

## 2021-05-11 DIAGNOSIS — I5022 Chronic systolic (congestive) heart failure: Secondary | ICD-10-CM

## 2021-06-25 ENCOUNTER — Ambulatory Visit (INDEPENDENT_AMBULATORY_CARE_PROVIDER_SITE_OTHER): Payer: BC Managed Care – PPO

## 2021-06-25 DIAGNOSIS — I428 Other cardiomyopathies: Secondary | ICD-10-CM

## 2021-07-11 LAB — CUP PACEART REMOTE DEVICE CHECK
Battery Remaining Longevity: 25 mo
Battery Remaining Percentage: 29 %
Battery Voltage: 2.86 V
Brady Statistic AP VP Percent: 2.3 %
Brady Statistic AP VS Percent: 1 %
Brady Statistic AS VP Percent: 94 %
Brady Statistic AS VS Percent: 2.1 %
Brady Statistic RA Percent Paced: 1.6 %
Date Time Interrogation Session: 20220714043639
HighPow Impedance: 80 Ohm
HighPow Impedance: 80 Ohm
Implantable Lead Implant Date: 20170426
Implantable Lead Implant Date: 20170426
Implantable Lead Implant Date: 20170426
Implantable Lead Location: 753858
Implantable Lead Location: 753859
Implantable Lead Location: 753860
Implantable Lead Model: 293
Implantable Lead Serial Number: 385378
Implantable Pulse Generator Implant Date: 20170426
Lead Channel Impedance Value: 400 Ohm
Lead Channel Impedance Value: 480 Ohm
Lead Channel Impedance Value: 830 Ohm
Lead Channel Pacing Threshold Amplitude: 0.5 V
Lead Channel Pacing Threshold Amplitude: 0.75 V
Lead Channel Pacing Threshold Amplitude: 1.125 V
Lead Channel Pacing Threshold Pulse Width: 0.4 ms
Lead Channel Pacing Threshold Pulse Width: 0.4 ms
Lead Channel Pacing Threshold Pulse Width: 0.8 ms
Lead Channel Sensing Intrinsic Amplitude: 10.6 mV
Lead Channel Sensing Intrinsic Amplitude: 3.1 mV
Lead Channel Setting Pacing Amplitude: 1.25 V
Lead Channel Setting Pacing Amplitude: 2.125
Lead Channel Setting Pacing Amplitude: 2.5 V
Lead Channel Setting Pacing Pulse Width: 0.4 ms
Lead Channel Setting Pacing Pulse Width: 0.8 ms
Lead Channel Setting Sensing Sensitivity: 0.5 mV
Pulse Gen Serial Number: 7307210

## 2021-07-18 NOTE — Progress Notes (Signed)
Remote ICD transmission.   

## 2021-08-06 ENCOUNTER — Other Ambulatory Visit (HOSPITAL_COMMUNITY): Payer: Self-pay | Admitting: Internal Medicine

## 2021-08-09 ENCOUNTER — Other Ambulatory Visit (HOSPITAL_COMMUNITY): Payer: Self-pay | Admitting: Internal Medicine

## 2021-08-18 ENCOUNTER — Other Ambulatory Visit (HOSPITAL_COMMUNITY): Payer: Self-pay | Admitting: *Deleted

## 2021-08-18 DIAGNOSIS — I5022 Chronic systolic (congestive) heart failure: Secondary | ICD-10-CM

## 2021-08-18 MED ORDER — ENTRESTO 97-103 MG PO TABS: 1.0000 | ORAL_TABLET | Freq: Two times a day (BID) | ORAL | 0 refills | Status: DC

## 2021-08-25 ENCOUNTER — Telehealth (HOSPITAL_COMMUNITY): Payer: Self-pay | Admitting: Pharmacy Technician

## 2021-08-25 NOTE — Telephone Encounter (Signed)
Patient Advocate Encounter   Received notification from [employer] that prior authorization for Sherryll Burger is required.   PA submitted on CoverMyMeds Key [id number]  Status is pending   Will continue to follow.

## 2021-09-04 ENCOUNTER — Other Ambulatory Visit (HOSPITAL_COMMUNITY): Payer: Self-pay

## 2021-09-04 NOTE — Telephone Encounter (Signed)
Advanced Heart Failure Patient Advocate Encounter  Prior Authorization for Sherryll Burger has been approved.    PA# [id number] Effective dates: 08/25/21 through 08/25/22  Patients co-pay is $30  Archer Asa, CPhT

## 2021-09-24 ENCOUNTER — Ambulatory Visit (INDEPENDENT_AMBULATORY_CARE_PROVIDER_SITE_OTHER): Payer: BC Managed Care – PPO

## 2021-09-24 DIAGNOSIS — I428 Other cardiomyopathies: Secondary | ICD-10-CM

## 2021-09-24 LAB — CUP PACEART REMOTE DEVICE CHECK
Battery Remaining Longevity: 24 mo
Battery Remaining Percentage: 28 %
Battery Voltage: 2.86 V
Brady Statistic AP VP Percent: 2.2 %
Brady Statistic AP VS Percent: 1 %
Brady Statistic AS VP Percent: 94 %
Brady Statistic AS VS Percent: 2.3 %
Brady Statistic RA Percent Paced: 1.5 %
Date Time Interrogation Session: 20221013020015
HighPow Impedance: 82 Ohm
HighPow Impedance: 82 Ohm
Implantable Lead Implant Date: 20170426
Implantable Lead Implant Date: 20170426
Implantable Lead Implant Date: 20170426
Implantable Lead Location: 753858
Implantable Lead Location: 753859
Implantable Lead Location: 753860
Implantable Lead Model: 293
Implantable Lead Serial Number: 385378
Implantable Pulse Generator Implant Date: 20170426
Lead Channel Impedance Value: 400 Ohm
Lead Channel Impedance Value: 530 Ohm
Lead Channel Impedance Value: 830 Ohm
Lead Channel Pacing Threshold Amplitude: 0.5 V
Lead Channel Pacing Threshold Amplitude: 0.75 V
Lead Channel Pacing Threshold Amplitude: 1.25 V
Lead Channel Pacing Threshold Pulse Width: 0.4 ms
Lead Channel Pacing Threshold Pulse Width: 0.4 ms
Lead Channel Pacing Threshold Pulse Width: 0.8 ms
Lead Channel Sensing Intrinsic Amplitude: 3.6 mV
Lead Channel Sensing Intrinsic Amplitude: 8.2 mV
Lead Channel Setting Pacing Amplitude: 1.25 V
Lead Channel Setting Pacing Amplitude: 2.25 V
Lead Channel Setting Pacing Amplitude: 2.5 V
Lead Channel Setting Pacing Pulse Width: 0.4 ms
Lead Channel Setting Pacing Pulse Width: 0.8 ms
Lead Channel Setting Sensing Sensitivity: 0.5 mV
Pulse Gen Serial Number: 7307210

## 2021-10-02 NOTE — Progress Notes (Signed)
Remote ICD transmission.   

## 2021-10-08 ENCOUNTER — Encounter (HOSPITAL_COMMUNITY): Payer: Self-pay | Admitting: Internal Medicine

## 2021-10-08 ENCOUNTER — Other Ambulatory Visit (HOSPITAL_COMMUNITY): Payer: Self-pay

## 2021-10-08 ENCOUNTER — Other Ambulatory Visit: Payer: Self-pay

## 2021-10-08 ENCOUNTER — Ambulatory Visit (HOSPITAL_COMMUNITY)
Admission: RE | Admit: 2021-10-08 | Discharge: 2021-10-08 | Disposition: A | Discharge status: Home / Self Care | Payer: BC Managed Care – PPO | Source: Ambulatory Visit | Attending: Internal Medicine | Admitting: Internal Medicine

## 2021-10-08 VITALS — BP 120/78 | HR 82 | Wt 279.2 lb

## 2021-10-08 DIAGNOSIS — E785 Hyperlipidemia, unspecified: Secondary | ICD-10-CM | POA: Insufficient documentation

## 2021-10-08 DIAGNOSIS — M109 Gout, unspecified: Secondary | ICD-10-CM | POA: Diagnosis not present

## 2021-10-08 DIAGNOSIS — Z7982 Long term (current) use of aspirin: Secondary | ICD-10-CM | POA: Insufficient documentation

## 2021-10-08 DIAGNOSIS — I2510 Atherosclerotic heart disease of native coronary artery without angina pectoris: Secondary | ICD-10-CM | POA: Insufficient documentation

## 2021-10-08 DIAGNOSIS — I5022 Chronic systolic (congestive) heart failure: Secondary | ICD-10-CM | POA: Diagnosis not present

## 2021-10-08 DIAGNOSIS — Z8249 Family history of ischemic heart disease and other diseases of the circulatory system: Secondary | ICD-10-CM | POA: Insufficient documentation

## 2021-10-08 DIAGNOSIS — I110 Hypertensive heart disease with heart failure: Secondary | ICD-10-CM | POA: Insufficient documentation

## 2021-10-08 DIAGNOSIS — I428 Other cardiomyopathies: Secondary | ICD-10-CM | POA: Diagnosis not present

## 2021-10-08 DIAGNOSIS — I447 Left bundle-branch block, unspecified: Secondary | ICD-10-CM

## 2021-10-08 DIAGNOSIS — E119 Type 2 diabetes mellitus without complications: Secondary | ICD-10-CM | POA: Insufficient documentation

## 2021-10-08 DIAGNOSIS — Z79899 Other long term (current) drug therapy: Secondary | ICD-10-CM | POA: Insufficient documentation

## 2021-10-08 DIAGNOSIS — Z6841 Body Mass Index (BMI) 40.0 and over, adult: Secondary | ICD-10-CM | POA: Insufficient documentation

## 2021-10-08 DIAGNOSIS — G4700 Insomnia, unspecified: Secondary | ICD-10-CM | POA: Diagnosis not present

## 2021-10-08 DIAGNOSIS — I10 Essential (primary) hypertension: Secondary | ICD-10-CM

## 2021-10-08 MED ORDER — SIMVASTATIN 20 MG PO TABS
20.0000 mg | ORAL_TABLET | Freq: Every day | ORAL | 3 refills | Status: DC
Start: 2021-10-08 — End: 2022-12-07

## 2021-10-08 MED ORDER — TORSEMIDE 20 MG PO TABS
20.0000 mg | ORAL_TABLET | Freq: Every day | ORAL | 11 refills | Status: DC
Start: 2021-10-08 — End: 2023-01-20

## 2021-10-08 MED ORDER — ENTRESTO 97-103 MG PO TABS: 1.0000 | ORAL_TABLET | Freq: Two times a day (BID) | ORAL | 3 refills | Status: DC

## 2021-10-08 MED ORDER — AMLODIPINE BESYLATE 5 MG PO TABS
5.0000 mg | ORAL_TABLET | Freq: Every day | ORAL | 11 refills | Status: DC
Start: 2021-10-08 — End: 2022-12-01

## 2021-10-08 MED ORDER — ALLOPURINOL 300 MG PO TABS
300.0000 mg | ORAL_TABLET | Freq: Every day | ORAL | 11 refills | Status: DC
Start: 2021-10-08 — End: 2022-12-01

## 2021-10-08 MED ORDER — EMPAGLIFLOZIN 10 MG PO TABS: 10.0000 mg | ORAL_TABLET | Freq: Every day | ORAL | 11 refills | Status: DC

## 2021-10-08 MED ORDER — EPLERENONE 25 MG PO TABS
25.0000 mg | ORAL_TABLET | Freq: Every day | ORAL | 11 refills | Status: DC
Start: 2021-10-08 — End: 2022-12-09

## 2021-10-08 MED ORDER — CARVEDILOL 25 MG PO TABS
25.0000 mg | ORAL_TABLET | Freq: Two times a day (BID) | ORAL | 11 refills | Status: DC
Start: 2021-10-08 — End: 2023-01-20

## 2021-10-08 NOTE — Progress Notes (Signed)
ADVANCED HF CLINIC NOTE  Patient ID: Travis Cline, male   DOB: 1966-02-07, 55 y.o.   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 Cath with non-obstructive CAD.   Underwent St Jude CRT-D implant on 04/07/16   Today he presents for HF follow up.  Feels good. Working at [employer]. 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 Denies CP, SOB, orthopnea or PND Compliant with meds.   Echo 4/21: read as 45-50% (I think 40-45%)    Echo 12/16 EF ~25%. RV ok  Echo 6/18 30-35%    EKG 07/21/15 NSR 92 bpm LBBB QRS 170   LHC 8/6  Prox LAD lesion, 35% stenosed. Prox Cx to Mid Cx lesion, 30% stenosed. There is severe left ventricular systolic dysfunction  ECHO 07/2015 -->EF 15%. RV mildly dilated. Peak PA pressure 54 mm hg  CXR  07/15/15 shows borderline heart size, mild vasc congestion, low lung vols and basilar atx...   PFT 06/17/15 showed FVC=3.90 (75%), FEV1=2.88 (72%), %1sec=74, mid-flows reduced at 58% predicted; post-bronchodil showed an 8% improvement in FEV1;  LungVols showed TLC=6.07 (86%), RV=1.97 (97%), DLCO=99%;  These are c/w some mild obstruction/ small airways disease...  Labs 07/16/2015 K 4.5 Creatinine 0.95  Hgb 17.3  Labs 09/12/2015: K 5.3 Creatinine 1.01   Labs 10/28/2015: K 54.7 Creatinine 0.93  SH:  Social History   Socioeconomic History   Marital status: Married    Spouse name: Not on file   Number of children: Not on file   Years of education: Not on file   Highest education level: Not on file  Occupational History   Not on file  Tobacco Use   Smoking status: Never   Smokeless tobacco: Never  Vaping Use   Vaping Use: Never used  Substance and Sexual Activity   Alcohol use: Yes    Alcohol/week: 1.0 - 2.0 standard drink    Types: 1 - 2  Standard drinks or equivalent per week    Comment: was previously drinking 2-3 beers/day - currently drinking about 4 beers/month.   Drug use: No   Sexual activity: Yes  Other Topics Concern   Not on file  Social History Narrative    He lives in [place] with his wife and 2 stepchildren. He has 2 children of his own who live in  [place]. He does not routinely exercise. 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   Social Determinants of Health   Financial Resource Strain: Not on file  Food Insecurity: Not on file  Transportation Needs: Not on file  Physical Activity: Not on file  Stress: Not on file  Social Connections: Not on file  Intimate Partner Violence: Not on file    FH:  Family History  Problem Relation Age of Onset   Other Father        alive & well @ 30.   Arthritis Father    Other Mother        alive & well @ 12.   Heart disease Mother    Other Other        siblings alive and well.    Past Medical History:  Diagnosis Date   Asthma  Cardiomyopathy (HCC)    a.  06/2015  echo: EF 15%, diffuse hypokinesis, anteroseptal/apical akinesis. Mild mitral regurgitation. Severely dilated left atrium. Mildly reduced RV function. ulnar artery systolic pressure 54 mmHg.   CHF (congestive heart failure) (HCC)    Erectile dysfunction    Essential hypertension    GERD (gastroesophageal reflux disease)    Gout    Hyperlipemia    Insomnia    LBBB (left bundle branch block)    Morbid obesity (HCC)    Wears glasses     Current Outpatient Medications  Medication Sig Dispense Refill   allopurinol (ZYLOPRIM) 300 MG tablet Take 300 mg by mouth daily.     amLODipine (NORVASC) 5 MG tablet TAKE 1 TABLET BY MOUTH EVERY DAY 30 tablet 1   aspirin EC 81 MG tablet Take 1 tablet (81 mg total) by mouth daily. 90 tablet 3   carvedilol (COREG) 25 MG tablet TAKE 1 TABLET (25 MG TOTAL) BY MOUTH 2 (TWO) TIMES DAILY WITH A  MEAL. 180 tablet 0   cimetidine (TAGAMET) 200 MG tablet Take 200 mg by mouth 2 (two) times daily as needed (Heartburn).      eplerenone (INSPRA) 25 MG tablet TAKE 1 TABLET BY MOUTH EVERY DAY 30 tablet 1   mometasone (ELOCON) 0.1 % cream Apply 1 application topically daily as needed (Skin rash).      Multiple Vitamins-Minerals (MULTIVITAMIN WITH MINERALS) tablet Take 1 tablet by mouth daily.     nitroGLYCERIN (NITROSTAT) 0.4 MG SL tablet Place 0.4 mg under the tongue every 5 (five) minutes as needed for chest pain.     Omega-3 Fatty Acids (OMEGA-3 FISH OIL) 1200 MG CAPS Take 1,200 mg by mouth daily.     sacubitril-valsartan (ENTRESTO) 97-103 MG Take 1 tablet by mouth 2 (two) times daily. Needs appt for further refills 180 tablet 0   simvastatin (ZOCOR) 20 MG tablet Take 1 tablet (20 mg total) by mouth daily. 90 tablet 0   torsemide (DEMADEX) 20 MG tablet Take 2 tablets (40 mg total) by mouth daily. Last refill without office visit [phone] 60 tablet 0   No current facility-administered medications for this encounter.    Vitals:   10/08/21 1450  BP: 120/78  Pulse: 82  SpO2: 97%  Weight: 126.6 kg (279 lb 3.2 oz)   Filed Weights   10/08/21 1450  Weight: 126.6 kg (279 lb 3.2 oz)    PHYSICAL EXAM:  General:  Well appearing. No resp difficulty HEENT: normal Neck: supple. no JVD. Carotids 2+ bilat; no bruits. No lymphadenopathy or thryomegaly appreciated. Cor: PMI nondisplaced. Regular rate & rhythm. No rubs, gallops or murmurs. Lungs: clear Abdomen: obese soft, nontender, nondistended. No hepatosplenomegaly. No bruits or masses. Good bowel sounds. Extremities: no cyanosis, clubbing, rash, edema Neuro: alert & orientedx3, cranial nerves grossly intact. moves all 4 extremities w/o difficulty. Affect pleasant   NSR 89 biv pacing QRS 176 ms + PVCs Personally reviewed    ASSESSMENT & PLAN:  1. Chronic Systolic HF NICM. Had LHC 8/8 with nonobstructive CAD.  S/p SJ CRT-D 4/17 - Now  s/p CRT-D. - Doing well.NYHA I-II Volume status ok  - ECHO 12/16 EF 20-25% Septum dyskinetic from LBBB RV mildly hypokinetic.  - Echo 6/18 EF 30-35% - Echo 4/21 Read as 45-50% (I think 40-45%)  Personally reviewed - Continue carvedilol 25 mg twice a day.  - Continue entresto 97/103 bid - Continue eplerenone  25 mg daily. (had painful gynecomastia with spiro)  - Add Jardiance  cut torsemide to 20 daily  - ICD interrogated in clinic: No VT/AF. 95% biv pacing.  Activity > 4hr/day fluid ok Personally reviewed 2. LBBB --s/p CRT-D.  - QRS wider today. Have reviewed with Dr. Graciela Husbands. Will see if they can reprogram 3. HTN - Blood pressure well controlled. Continue current regimen. 4. Gout  - Continue current regimen per PCP 5. ETOH - Former heavy drinker. Rarely drinking alcohol.  6. DM2 - Continue rebellsus and metfomin - Add Jardaince 10   Arvilla Meres MD  3:13 PM

## 2021-10-08 NOTE — Patient Instructions (Signed)
Start Jardiance 10 mg Daily  Decrease Torsemide to 20 mg (1 tab) Daily, can take extra tab as needed  Your physician recommends that you schedule a follow-up appointment in: 9 months (July 2023), **PLEASE CALL OUR OFFICE IN MAY TO SCHEDULE THIS APPOINTMENT   If you have any questions or concerns before your next appointment please send [place] a message through [place] or call our office at [phone].    TO LEAVE A MESSAGE FOR THE NURSE SELECT OPTION 2, PLEASE LEAVE A MESSAGE INCLUDING: YOUR NAME DATE OF BIRTH CALL BACK NUMBER REASON FOR CALL**this is important as we prioritize the call backs  YOU WILL RECEIVE A CALL BACK THE SAME DAY AS LONG AS YOU CALL BEFORE 4:00 PM  At the [facility], you and your health needs are our priority. As part of our continuing mission to provide you with exceptional heart care, we have created designated Provider Care Teams. These Care Teams include your primary Cardiologist (physician) and Advanced Practice Providers (APPs- Physician Assistants and Nurse Practitioners) who all work together to provide you with the care you need, when you need it.   You may see any of the following providers on your designated Care Team at your next follow up: Dr Arvilla Meres Dr Carron Curie, NP Robbie Lis, [place] [facility], [place] Karle Plumber, PharmD   Please be sure to bring in all your medications bottles to every appointment.

## 2021-12-24 ENCOUNTER — Ambulatory Visit (INDEPENDENT_AMBULATORY_CARE_PROVIDER_SITE_OTHER): Payer: BC Managed Care – PPO

## 2021-12-24 DIAGNOSIS — I428 Other cardiomyopathies: Secondary | ICD-10-CM | POA: Diagnosis not present

## 2021-12-27 LAB — CUP PACEART REMOTE DEVICE CHECK
Battery Remaining Longevity: 20 mo
Battery Remaining Percentage: 23 %
Battery Voltage: 2.8 V
Brady Statistic AP VP Percent: 2.1 %
Brady Statistic AP VS Percent: 1 %
Brady Statistic AS VP Percent: 94 %
Brady Statistic AS VS Percent: 2.5 %
Brady Statistic RA Percent Paced: 1.5 %
Date Time Interrogation Session: 20230115023758
HighPow Impedance: 68 Ohm
HighPow Impedance: 68 Ohm
Implantable Lead Implant Date: 20170426
Implantable Lead Implant Date: 20170426
Implantable Lead Implant Date: 20170426
Implantable Lead Location: 753858
Implantable Lead Location: 753859
Implantable Lead Location: 753860
Implantable Lead Model: 293
Implantable Lead Serial Number: 385378
Implantable Pulse Generator Implant Date: 20170426
Lead Channel Impedance Value: 450 Ohm
Lead Channel Impedance Value: 480 Ohm
Lead Channel Impedance Value: 760 Ohm
Lead Channel Pacing Threshold Amplitude: 0.5 V
Lead Channel Pacing Threshold Amplitude: 0.75 V
Lead Channel Pacing Threshold Amplitude: 1.375 V
Lead Channel Pacing Threshold Pulse Width: 0.4 ms
Lead Channel Pacing Threshold Pulse Width: 0.4 ms
Lead Channel Pacing Threshold Pulse Width: 0.8 ms
Lead Channel Sensing Intrinsic Amplitude: 3.3 mV
Lead Channel Sensing Intrinsic Amplitude: 9 mV
Lead Channel Setting Pacing Amplitude: 1.25 V
Lead Channel Setting Pacing Amplitude: 2.375
Lead Channel Setting Pacing Amplitude: 2.5 V
Lead Channel Setting Pacing Pulse Width: 0.4 ms
Lead Channel Setting Pacing Pulse Width: 0.8 ms
Lead Channel Setting Sensing Sensitivity: 0.5 mV
Pulse Gen Serial Number: 7307210

## 2022-01-05 NOTE — Progress Notes (Signed)
Remote ICD transmission.   

## 2022-01-13 ENCOUNTER — Telehealth (HOSPITAL_COMMUNITY): Payer: Self-pay

## 2022-01-13 NOTE — Telephone Encounter (Signed)
Received a fax requesting medical records from [facility] Records were successfully faxed to: [phone] ,which was the number provided.. Medical request form will be scanned into patients chart.

## 2022-01-25 ENCOUNTER — Other Ambulatory Visit (HOSPITAL_COMMUNITY): Payer: Self-pay | Admitting: Internal Medicine

## 2022-03-25 ENCOUNTER — Ambulatory Visit (INDEPENDENT_AMBULATORY_CARE_PROVIDER_SITE_OTHER): Payer: BC Managed Care – PPO

## 2022-03-25 DIAGNOSIS — I428 Other cardiomyopathies: Secondary | ICD-10-CM

## 2022-03-25 LAB — CUP PACEART REMOTE DEVICE CHECK
Battery Remaining Longevity: 23 mo
Battery Remaining Percentage: 27 %
Battery Voltage: 2.83 V
Brady Statistic AP VP Percent: 2.1 %
Brady Statistic AP VS Percent: 1 %
Brady Statistic AS VP Percent: 94 %
Brady Statistic AS VS Percent: 2.7 %
Brady Statistic RA Percent Paced: 1.4 %
Date Time Interrogation Session: 20230413040607
HighPow Impedance: 86 Ohm
HighPow Impedance: 86 Ohm
Implantable Lead Implant Date: 20170426
Implantable Lead Implant Date: 20170426
Implantable Lead Implant Date: 20170426
Implantable Lead Location: 753858
Implantable Lead Location: 753859
Implantable Lead Location: 753860
Implantable Lead Model: 293
Implantable Lead Serial Number: 385378
Implantable Pulse Generator Implant Date: 20170426
Lead Channel Impedance Value: 430 Ohm
Lead Channel Impedance Value: 550 Ohm
Lead Channel Impedance Value: 890 Ohm
Lead Channel Pacing Threshold Amplitude: 0.5 V
Lead Channel Pacing Threshold Amplitude: 0.75 V
Lead Channel Pacing Threshold Amplitude: 1.25 V
Lead Channel Pacing Threshold Pulse Width: 0.4 ms
Lead Channel Pacing Threshold Pulse Width: 0.4 ms
Lead Channel Pacing Threshold Pulse Width: 0.8 ms
Lead Channel Sensing Intrinsic Amplitude: 12 mV
Lead Channel Sensing Intrinsic Amplitude: 4.6 mV
Lead Channel Setting Pacing Amplitude: 1.25 V
Lead Channel Setting Pacing Amplitude: 2.25 V
Lead Channel Setting Pacing Amplitude: 2.5 V
Lead Channel Setting Pacing Pulse Width: 0.4 ms
Lead Channel Setting Pacing Pulse Width: 0.8 ms
Lead Channel Setting Sensing Sensitivity: 0.5 mV
Pulse Gen Serial Number: 7307210

## 2022-04-12 NOTE — Progress Notes (Signed)
Remote ICD transmission.   

## 2022-06-24 ENCOUNTER — Ambulatory Visit (INDEPENDENT_AMBULATORY_CARE_PROVIDER_SITE_OTHER): Payer: BC Managed Care – PPO

## 2022-06-24 DIAGNOSIS — I428 Other cardiomyopathies: Secondary | ICD-10-CM

## 2022-06-24 LAB — CUP PACEART REMOTE DEVICE CHECK
Battery Remaining Longevity: 22 mo
Battery Remaining Percentage: 25 %
Battery Voltage: 2.81 V
Brady Statistic AP VP Percent: 2 %
Brady Statistic AP VS Percent: 1 %
Brady Statistic AS VP Percent: 94 %
Brady Statistic AS VS Percent: 2.8 %
Brady Statistic RA Percent Paced: 1.4 %
Date Time Interrogation Session: 20230713020017
HighPow Impedance: 82 Ohm
HighPow Impedance: 82 Ohm
Implantable Lead Implant Date: 20170426
Implantable Lead Implant Date: 20170426
Implantable Lead Implant Date: 20170426
Implantable Lead Location: 753858
Implantable Lead Location: 753859
Implantable Lead Location: 753860
Implantable Lead Model: 293
Implantable Lead Serial Number: 385378
Implantable Pulse Generator Implant Date: 20170426
Lead Channel Impedance Value: 380 Ohm
Lead Channel Impedance Value: 490 Ohm
Lead Channel Impedance Value: 780 Ohm
Lead Channel Pacing Threshold Amplitude: 0.5 V
Lead Channel Pacing Threshold Amplitude: 0.75 V
Lead Channel Pacing Threshold Amplitude: 1.375 V
Lead Channel Pacing Threshold Pulse Width: 0.4 ms
Lead Channel Pacing Threshold Pulse Width: 0.4 ms
Lead Channel Pacing Threshold Pulse Width: 0.8 ms
Lead Channel Sensing Intrinsic Amplitude: 10.5 mV
Lead Channel Sensing Intrinsic Amplitude: 3.5 mV
Lead Channel Setting Pacing Amplitude: 1.25 V
Lead Channel Setting Pacing Amplitude: 2.375
Lead Channel Setting Pacing Amplitude: 2.5 V
Lead Channel Setting Pacing Pulse Width: 0.4 ms
Lead Channel Setting Pacing Pulse Width: 0.8 ms
Lead Channel Setting Sensing Sensitivity: 0.5 mV
Pulse Gen Serial Number: 7307210

## 2022-07-07 ENCOUNTER — Other Ambulatory Visit: Payer: Self-pay | Admitting: Family Medicine

## 2022-07-07 DIAGNOSIS — R7989 Other specified abnormal findings of blood chemistry: Secondary | ICD-10-CM

## 2022-07-09 NOTE — Progress Notes (Signed)
Remote ICD transmission.   

## 2022-07-19 ENCOUNTER — Ambulatory Visit
Admission: RE | Admit: 2022-07-19 | Discharge: 2022-07-19 | Disposition: A | Discharge status: Home / Self Care | Payer: BC Managed Care – PPO | Source: Ambulatory Visit | Attending: Family Medicine | Admitting: Family Medicine

## 2022-07-19 DIAGNOSIS — R7989 Other specified abnormal findings of blood chemistry: Secondary | ICD-10-CM

## 2022-07-27 ENCOUNTER — Telehealth (HOSPITAL_COMMUNITY): Payer: Self-pay | Admitting: Pharmacy Technician

## 2022-07-27 ENCOUNTER — Other Ambulatory Visit (HOSPITAL_COMMUNITY): Payer: Self-pay

## 2022-07-27 NOTE — Telephone Encounter (Signed)
Advanced Heart Failure Patient Advocate Encounter  Received PA request for CMM. Insurance states that it is a refill too soon and PA not needed at this time.   Archer Asa, CPhT

## 2022-09-06 ENCOUNTER — Telehealth (HOSPITAL_COMMUNITY): Payer: Self-pay

## 2022-09-06 ENCOUNTER — Other Ambulatory Visit (HOSPITAL_COMMUNITY): Payer: Self-pay

## 2022-09-06 NOTE — Telephone Encounter (Signed)
Advanced Heart Failure Patient Advocate Encounter  Received notification from [place] that prior authorization is required for Entresto 97-103  Submitted: [id number]  Key 09/06/2022  Clista Bernhardt, CPhT Rx Patient Advocate Phone: [phone]

## 2022-09-07 ENCOUNTER — Other Ambulatory Visit (HOSPITAL_COMMUNITY): Payer: Self-pay

## 2022-09-07 NOTE — Telephone Encounter (Addendum)
Advanced Heart Failure Patient Advocate Encounter  Prior Authorization for Delene Loll has been approved.    PA#  [id number] Effective dates: 09/06/22 through 09/07/23  Charlann Boxer, CPhT

## 2022-09-23 ENCOUNTER — Ambulatory Visit (INDEPENDENT_AMBULATORY_CARE_PROVIDER_SITE_OTHER): Payer: BC Managed Care – PPO

## 2022-09-23 DIAGNOSIS — I428 Other cardiomyopathies: Secondary | ICD-10-CM

## 2022-09-23 LAB — CUP PACEART REMOTE DEVICE CHECK
Battery Remaining Longevity: 19 mo
Battery Remaining Percentage: 22 %
Battery Voltage: 2.78 V
Brady Statistic AP VP Percent: 1.9 %
Brady Statistic AP VS Percent: 1 %
Brady Statistic AS VP Percent: 94 %
Brady Statistic AS VS Percent: 2.7 %
Brady Statistic RA Percent Paced: 1.3 %
Date Time Interrogation Session: 20231012050759
HighPow Impedance: 86 Ohm
HighPow Impedance: 86 Ohm
Implantable Lead Implant Date: 20170426
Implantable Lead Implant Date: 20170426
Implantable Lead Implant Date: 20170426
Implantable Lead Location: 753858
Implantable Lead Location: 753859
Implantable Lead Location: 753860
Implantable Lead Model: 293
Implantable Lead Serial Number: 385378
Implantable Pulse Generator Implant Date: 20170426
Lead Channel Impedance Value: 430 Ohm
Lead Channel Impedance Value: 530 Ohm
Lead Channel Impedance Value: 840 Ohm
Lead Channel Pacing Threshold Amplitude: 0.5 V
Lead Channel Pacing Threshold Amplitude: 0.75 V
Lead Channel Pacing Threshold Amplitude: 1.375 V
Lead Channel Pacing Threshold Pulse Width: 0.4 ms
Lead Channel Pacing Threshold Pulse Width: 0.4 ms
Lead Channel Pacing Threshold Pulse Width: 0.8 ms
Lead Channel Sensing Intrinsic Amplitude: 10.9 mV
Lead Channel Sensing Intrinsic Amplitude: 3.9 mV
Lead Channel Setting Pacing Amplitude: 1.25 V
Lead Channel Setting Pacing Amplitude: 2.375
Lead Channel Setting Pacing Amplitude: 2.5 V
Lead Channel Setting Pacing Pulse Width: 0.4 ms
Lead Channel Setting Pacing Pulse Width: 0.8 ms
Lead Channel Setting Sensing Sensitivity: 0.5 mV
Pulse Gen Serial Number: 7307210

## 2022-09-30 NOTE — Progress Notes (Signed)
Remote ICD transmission.   

## 2022-10-11 ENCOUNTER — Other Ambulatory Visit (HOSPITAL_COMMUNITY): Payer: Self-pay | Admitting: Internal Medicine

## 2022-11-11 ENCOUNTER — Other Ambulatory Visit (HOSPITAL_COMMUNITY): Payer: Self-pay | Admitting: Internal Medicine

## 2022-12-01 ENCOUNTER — Other Ambulatory Visit (HOSPITAL_COMMUNITY): Payer: Self-pay

## 2022-12-01 ENCOUNTER — Other Ambulatory Visit (HOSPITAL_COMMUNITY): Payer: Self-pay | Admitting: Internal Medicine

## 2022-12-02 ENCOUNTER — Other Ambulatory Visit (HOSPITAL_COMMUNITY): Payer: Self-pay

## 2022-12-02 MED ORDER — OZEMPIC (1 MG/DOSE) 4 MG/3ML ~~LOC~~ SOPN
1.0000 mg | PEN_INJECTOR | SUBCUTANEOUS | 3 refills | Status: DC
  Filled 2022-12-02: qty 3, 28d supply, fill #0

## 2022-12-02 MED ORDER — OZEMPIC (1 MG/DOSE) 4 MG/3ML ~~LOC~~ SOPN
1.0000 mg | PEN_INJECTOR | SUBCUTANEOUS | 8 refills | Status: DC
  Filled 2022-12-02: qty 3, 28d supply, fill #0

## 2022-12-03 ENCOUNTER — Other Ambulatory Visit (HOSPITAL_COMMUNITY): Payer: Self-pay

## 2022-12-05 ENCOUNTER — Other Ambulatory Visit (HOSPITAL_COMMUNITY): Payer: Self-pay | Admitting: Internal Medicine

## 2022-12-05 DIAGNOSIS — I5022 Chronic systolic (congestive) heart failure: Secondary | ICD-10-CM

## 2022-12-09 ENCOUNTER — Other Ambulatory Visit (HOSPITAL_COMMUNITY): Payer: Self-pay | Admitting: Internal Medicine

## 2022-12-23 ENCOUNTER — Ambulatory Visit (INDEPENDENT_AMBULATORY_CARE_PROVIDER_SITE_OTHER): Payer: BC Managed Care – PPO

## 2022-12-23 DIAGNOSIS — I428 Other cardiomyopathies: Secondary | ICD-10-CM

## 2022-12-24 LAB — CUP PACEART REMOTE DEVICE CHECK
Battery Remaining Longevity: 17 mo
Battery Remaining Percentage: 19 %
Battery Voltage: 2.77 V
Brady Statistic AP VP Percent: 1.8 %
Brady Statistic AP VS Percent: 1 %
Brady Statistic AS VP Percent: 94 %
Brady Statistic AS VS Percent: 2.7 %
Brady Statistic RA Percent Paced: 1.2 %
Date Time Interrogation Session: 20240111224035
HighPow Impedance: 83 Ohm
HighPow Impedance: 83 Ohm
Implantable Lead Connection Status: 753985
Implantable Lead Connection Status: 753985
Implantable Lead Connection Status: 753985
Implantable Lead Implant Date: 20170426
Implantable Lead Implant Date: 20170426
Implantable Lead Implant Date: 20170426
Implantable Lead Location: 753858
Implantable Lead Location: 753859
Implantable Lead Location: 753860
Implantable Lead Model: 293
Implantable Lead Serial Number: 385378
Implantable Pulse Generator Implant Date: 20170426
Lead Channel Impedance Value: 480 Ohm
Lead Channel Impedance Value: 530 Ohm
Lead Channel Impedance Value: 830 Ohm
Lead Channel Pacing Threshold Amplitude: 0.5 V
Lead Channel Pacing Threshold Amplitude: 0.75 V
Lead Channel Pacing Threshold Amplitude: 1.25 V
Lead Channel Pacing Threshold Pulse Width: 0.4 ms
Lead Channel Pacing Threshold Pulse Width: 0.4 ms
Lead Channel Pacing Threshold Pulse Width: 0.8 ms
Lead Channel Sensing Intrinsic Amplitude: 4.2 mV
Lead Channel Sensing Intrinsic Amplitude: 5.3 mV
Lead Channel Setting Pacing Amplitude: 1.25 V
Lead Channel Setting Pacing Amplitude: 2.25 V
Lead Channel Setting Pacing Amplitude: 2.5 V
Lead Channel Setting Pacing Pulse Width: 0.4 ms
Lead Channel Setting Pacing Pulse Width: 0.8 ms
Lead Channel Setting Sensing Sensitivity: 0.5 mV
Pulse Gen Serial Number: 7307210

## 2023-01-12 NOTE — Progress Notes (Signed)
Remote ICD transmission.   

## 2023-01-19 ENCOUNTER — Other Ambulatory Visit (HOSPITAL_COMMUNITY): Payer: Self-pay | Admitting: Internal Medicine

## 2023-02-02 ENCOUNTER — Other Ambulatory Visit (HOSPITAL_COMMUNITY): Payer: Self-pay | Admitting: Internal Medicine

## 2023-02-25 ENCOUNTER — Telehealth: Payer: Self-pay

## 2023-02-25 NOTE — Telephone Encounter (Signed)
The patient states his remotes are not being billed correctly and going to collections. He wanted to cancel all his remotes for right now.   I let the patient know that because he had an ICD he would need to come into the office every 3 months to get his device checked.   I put the recall in for the patient device check.

## 2023-03-04 ENCOUNTER — Telehealth (HOSPITAL_COMMUNITY): Payer: Self-pay

## 2023-03-04 NOTE — Telephone Encounter (Signed)
Patient needs a next ava with Dr. Haroldine Laws

## 2023-03-20 ENCOUNTER — Other Ambulatory Visit (HOSPITAL_COMMUNITY): Payer: Self-pay | Admitting: Internal Medicine

## 2023-04-12 ENCOUNTER — Other Ambulatory Visit (HOSPITAL_COMMUNITY): Payer: Self-pay | Admitting: Internal Medicine

## 2023-04-14 ENCOUNTER — Other Ambulatory Visit (HOSPITAL_COMMUNITY): Payer: Self-pay | Admitting: Internal Medicine

## 2023-04-21 ENCOUNTER — Ambulatory Visit (HOSPITAL_COMMUNITY)
Admission: RE | Admit: 2023-04-21 | Discharge: 2023-04-21 | Disposition: A | Discharge status: Home / Self Care | Payer: BC Managed Care – PPO | Source: Ambulatory Visit | Attending: Internal Medicine | Admitting: Internal Medicine

## 2023-04-21 ENCOUNTER — Encounter (HOSPITAL_COMMUNITY): Payer: Self-pay | Admitting: Internal Medicine

## 2023-04-21 VITALS — BP 96/70 | HR 82 | Wt 244.6 lb

## 2023-04-21 DIAGNOSIS — I5022 Chronic systolic (congestive) heart failure: Secondary | ICD-10-CM | POA: Insufficient documentation

## 2023-04-21 DIAGNOSIS — I2510 Atherosclerotic heart disease of native coronary artery without angina pectoris: Secondary | ICD-10-CM | POA: Insufficient documentation

## 2023-04-21 DIAGNOSIS — I10 Essential (primary) hypertension: Secondary | ICD-10-CM | POA: Diagnosis not present

## 2023-04-21 DIAGNOSIS — I447 Left bundle-branch block, unspecified: Secondary | ICD-10-CM | POA: Insufficient documentation

## 2023-04-21 DIAGNOSIS — E669 Obesity, unspecified: Secondary | ICD-10-CM | POA: Insufficient documentation

## 2023-04-21 DIAGNOSIS — E785 Hyperlipidemia, unspecified: Secondary | ICD-10-CM | POA: Diagnosis not present

## 2023-04-21 DIAGNOSIS — Z7984 Long term (current) use of oral hypoglycemic drugs: Secondary | ICD-10-CM | POA: Diagnosis not present

## 2023-04-21 DIAGNOSIS — I428 Other cardiomyopathies: Secondary | ICD-10-CM | POA: Diagnosis not present

## 2023-04-21 DIAGNOSIS — Z7985 Long-term (current) use of injectable non-insulin antidiabetic drugs: Secondary | ICD-10-CM | POA: Diagnosis not present

## 2023-04-21 DIAGNOSIS — E119 Type 2 diabetes mellitus without complications: Secondary | ICD-10-CM | POA: Insufficient documentation

## 2023-04-21 DIAGNOSIS — Z8249 Family history of ischemic heart disease and other diseases of the circulatory system: Secondary | ICD-10-CM | POA: Diagnosis not present

## 2023-04-21 DIAGNOSIS — I110 Hypertensive heart disease with heart failure: Secondary | ICD-10-CM | POA: Insufficient documentation

## 2023-04-21 DIAGNOSIS — Z95810 Presence of automatic (implantable) cardiac defibrillator: Secondary | ICD-10-CM | POA: Insufficient documentation

## 2023-04-21 MED ORDER — SIMVASTATIN 20 MG PO TABS: 20.0000 mg | ORAL_TABLET | Freq: Every day | ORAL | 3 refills | Status: DC

## 2023-04-21 MED ORDER — NITROGLYCERIN 0.4 MG SL SUBL: 0.4000 mg | SUBLINGUAL_TABLET | SUBLINGUAL | 11 refills | Status: AC | PRN

## 2023-04-21 MED ORDER — CARVEDILOL 25 MG PO TABS: ORAL_TABLET | ORAL | 3 refills | Status: DC

## 2023-04-21 MED ORDER — EMPAGLIFLOZIN 10 MG PO TABS: ORAL_TABLET | ORAL | 3 refills | Status: DC

## 2023-04-21 MED ORDER — ENTRESTO 49-51 MG PO TABS: 1.0000 | ORAL_TABLET | Freq: Two times a day (BID) | ORAL | 11 refills | Status: DC

## 2023-04-21 MED ORDER — TORSEMIDE 20 MG PO TABS: 20.0000 mg | ORAL_TABLET | Freq: Every day | ORAL | 3 refills | Status: DC

## 2023-04-21 MED ORDER — AMLODIPINE BESYLATE 5 MG PO TABS: 5.0000 mg | ORAL_TABLET | Freq: Every day | ORAL | 3 refills | Status: DC

## 2023-04-21 MED ORDER — EPLERENONE 25 MG PO TABS: 25.0000 mg | ORAL_TABLET | Freq: Every day | ORAL | 3 refills | Status: DC

## 2023-04-21 NOTE — Addendum Note (Signed)
Encounter addended by: Linda Hedges, RN on: 04/21/2023 12:10 PM  Actions taken: Diagnosis association updated, Order list changed, Clinical Note Signed

## 2023-04-21 NOTE — Progress Notes (Signed)
ADVANCED HF CLINIC NOTE  Patient ID: Travis Cline, male   DOB: 01/24/1966, 57 y.o.   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 Cath with non-obstructive CAD.   Underwent St Jude CRT-D implant on 04/07/16   Today he presents for HF follow up. We haven't seen him for 18 months.   Feels good. Still works at [employer]. Also working in his woodshop. No CP or SOB. No edema, orthopnea or PND. Works on farm.   Echo 4/21: read as 45-50% (I think 40-45%) Echo 12/16 EF ~25%. RV ok  Echo 6/18 30-35%    EKG 07/21/15 NSR 92 bpm LBBB QRS 170   LHC 8/6  Prox LAD lesion, 35% stenosed. Prox Cx to Mid Cx lesion, 30% stenosed. There is severe left ventricular systolic dysfunction  ECHO 07/2015 -->EF 15%. RV mildly dilated. Peak PA pressure 54 mm hg  CXR  07/15/15 shows borderline heart size, mild vasc congestion, low lung vols and basilar atx...   PFT 06/17/15 showed FVC=3.90 (75%), FEV1=2.88 (72%), %1sec=74, mid-flows reduced at 58% predicted; post-bronchodil showed an 8% improvement in FEV1;  LungVols showed TLC=6.07 (86%), RV=1.97 (97%), DLCO=99%;  These are c/w some mild obstruction/ small airways disease...  Labs 07/16/2015 K 4.5 Creatinine 0.95  Hgb 17.3  Labs 09/12/2015: K 5.3 Creatinine 1.01   Labs 10/28/2015: K 54.7 Creatinine 0.93  SH:  Social History   Socioeconomic History   Marital status: Married    Spouse name: Not on file   Number of children: Not on file   Years of education: Not on file   Highest education level: Not on file  Occupational History   Not on file  Tobacco Use   Smoking status: Never   Smokeless tobacco: Never  Vaping Use   Vaping Use: Never used  Substance and Sexual Activity   Alcohol use: Yes    Alcohol/week: 1.0 - 2.0  standard drink of alcohol    Types: 1 - 2 Standard drinks or equivalent per week    Comment: was previously drinking 2-3 beers/day - currently drinking about 4 beers/month.   Drug use: No   Sexual activity: Yes  Other Topics Concern   Not on file  Social History Narrative    He lives in [place] with his wife and 2 stepchildren. He has 2 children of his own who live in  [place]. He does not routinely exercise. 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   Social Determinants of Health   Financial Resource Strain: Not on file  Food Insecurity: Not on file  Transportation Needs: Not on file  Physical Activity: Not on file  Stress: Not on file  Social Connections: Not on file  Intimate Partner Violence: Not on file    FH:  Family History  Problem Relation Age of Onset   Other Father        alive & well @ 57.   Arthritis Father    Other Mother        alive & well @ 31.   Heart disease Mother    Other Other        siblings alive and well.    Past Medical History:  Diagnosis Date   Asthma    Cardiomyopathy (HCC)    a.  06/2015  echo: EF 15%, diffuse hypokinesis, anteroseptal/apical akinesis. Mild mitral regurgitation. Severely dilated left atrium. Mildly reduced RV function. ulnar artery systolic pressure 54 mmHg.   CHF (congestive heart failure) (HCC)    Erectile dysfunction    Essential hypertension    GERD (gastroesophageal reflux disease)    Gout    Hyperlipemia    Insomnia    LBBB (left bundle branch block)    Morbid obesity (HCC)    Wears glasses     Current Outpatient Medications  Medication Sig Dispense Refill   allopurinol (ZYLOPRIM) 300 MG tablet TAKE 1 TABLET BY MOUTH EVERY DAY 30 tablet 0   amLODipine (NORVASC) 5 MG tablet TAKE 1 TABLET (5 MG TOTAL) BY MOUTH DAILY. 30 tablet 0   aspirin EC 81 MG tablet Take 1 tablet (81 mg total) by mouth daily. 90 tablet 3   carvedilol (COREG) 25 MG  tablet TAKE 1 TABLET (25 MG TOTAL) BY MOUTH TWICE A DAY WITH MEALS 60 tablet 0   cimetidine (TAGAMET) 200 MG tablet Take 200 mg by mouth 2 (two) times daily as needed (Heartburn).      empagliflozin (JARDIANCE) 10 MG TABS tablet TAKE 1 TABLET BY MOUTH EVERY DAY BEFORE BREAKFAST 90 tablet 3   ENTRESTO 97-103 MG TAKE 1 TABLET BY MOUTH 2 (TWO) TIMES DAILY. NEEDS APPT FOR FURTHER REFILLS 180 tablet 3   eplerenone (INSPRA) 25 MG tablet TAKE 1 TABLET (25 MG TOTAL) BY MOUTH DAILY. 30 tablet 0   mometasone (ELOCON) 0.1 % cream Apply 1 application topically daily as needed (Skin rash).      Multiple Vitamins-Minerals (MULTIVITAMIN WITH MINERALS) tablet Take 1 tablet by mouth daily.     nitroGLYCERIN (NITROSTAT) 0.4 MG SL tablet Place 0.4 mg under the tongue every 5 (five) minutes as needed for chest pain.     Omega-3 Fatty Acids (OMEGA-3 FISH OIL) 1200 MG CAPS Take 1,200 mg by mouth daily.     Semaglutide, 1 MG/DOSE, (OZEMPIC, 1 MG/DOSE,) 4 MG/3ML SOPN Inject 1 mg into the skin once a week. 3 mL 3   simvastatin (ZOCOR) 20 MG tablet TAKE 1 TABLET BY MOUTH EVERY DAY 90 tablet 3   torsemide (DEMADEX) 20 MG tablet TAKE 1 TABLET (20 MG TOTAL) BY MOUTH DAILY. TAKE EXTRA TAB AS NEEDED 135 tablet 0   No current facility-administered medications for this encounter.    Vitals:   04/21/23 1131 04/21/23 1201  BP: (!) 90/50 96/70  Pulse: 82   SpO2: 96%   Weight: 110.9 kg (244 lb 9.6 oz)    Filed Weights   04/21/23 1131  Weight: 110.9 kg (244 lb 9.6 oz)    PHYSICAL EXAM:  General:  Well appearing. No resp difficulty HEENT: normal Neck: supple. no JVD. Carotids 2+ bilat; no bruits. No lymphadenopathy or thryomegaly appreciated. Cor: PMI nondisplaced. Regular rate & rhythm. No rubs, gallops or murmurs. Lungs: clear Abdomen: soft, nontender, nondistended. No hepatosplenomegaly. No bruits or masses. Good bowel sounds. Extremities: no cyanosis, clubbing, rash, edema Neuro: alert & orientedx3, cranial nerves  grossly intact. moves all 4 extremities w/o difficulty. Affect pleasant   NSR 78 biv pacing QRS 190 ms Personally reviewed    ASSESSMENT & PLAN:  1. Chronic Systolic HF NICM. Had LHC 8/8 with nonobstructive CAD.  S/p SJ CRT-D 4/17 - Now s/p CRT-D. - Doing well.NYHA I. Volume ok - ECHO 12/16 EF 20-25% Septum dyskinetic  from LBBB RV mildly hypokinetic.  - Echo 6/18 EF 30-35% - Echo 4/21 Read as 45-50% (I think 40-45%)   - Continue carvedilol 25 mg twice a day.  - BP low. Cut entresto 97/103 bid -> 49/51 bid - Continue eplerenone  25 mg daily. (had painful gynecomastia with spiro)  - Continue Jardiance 10 - ICD interrogated in clinic: No VT/AF. 95% biv pacing.  Activity > 4hr/day fluid ok Personally reviewed - Recent labs with Dr. Clelia Croft were ok 2. LBBB --s/p CRT-D.  - Follows with Dr. Graciela Husbands 3. HTN - Blood pressure well controlled. Continue current regimen. 4. ETOH - Former heavy drinker. Rarely drinking alcohol.  5. DM2 - Continue Ozempic and Jardiance   Arvilla Meres MD  12:04 PM

## 2023-04-21 NOTE — Patient Instructions (Signed)
DECREASE Entresto to 49/51 mg Twice daily  Your physician has requested that you have an echocardiogram. Echocardiography is a painless test that uses sound waves to create images of your heart. It provides your doctor with information about the size and shape of your heart and how well your heart's chambers and valves are working. This procedure takes approximately one hour. There are no restrictions for this procedure. Please do NOT wear cologne, perfume, aftershave, or lotions (deodorant is allowed). Please arrive 15 minutes prior to your appointment time.  Your physician recommends that you schedule a follow-up appointment in: 1 year ( May 2025) ** please call the office in March 2025 to arrange your follow up appointment. **  If you have any questions or concerns before your next appointment please send [place] a message through [place] or call our office at [phone].    TO LEAVE A MESSAGE FOR THE NURSE SELECT OPTION 2, PLEASE LEAVE A MESSAGE INCLUDING: YOUR NAME DATE OF BIRTH CALL BACK NUMBER REASON FOR CALL**this is important as we prioritize the call backs  YOU WILL RECEIVE A CALL BACK THE SAME DAY AS LONG AS YOU CALL BEFORE 4:00 PM  At the [facility], you and your health needs are our priority. As part of our continuing mission to provide you with exceptional heart care, we have created designated Provider Care Teams. These Care Teams include your primary Cardiologist (physician) and Advanced Practice Providers (APPs- Physician Assistants and Nurse Practitioners) who all work together to provide you with the care you need, when you need it.   You may see any of the following providers on your designated Care Team at your next follow up: Dr Arvilla Meres Dr Marca Ancona Dr. Marcos Eke, NP Robbie Lis, [place] [facility], [place] Brynda Peon, NP Karle Plumber, PharmD   Please be sure to bring in all your medications bottles to  every appointment.    Thank you for choosing [facility]

## 2023-05-13 ENCOUNTER — Ambulatory Visit (HOSPITAL_COMMUNITY): Payer: BC Managed Care – PPO

## 2023-05-19 ENCOUNTER — Encounter (HOSPITAL_COMMUNITY): Payer: Self-pay | Admitting: Internal Medicine

## 2023-06-19 ENCOUNTER — Other Ambulatory Visit (HOSPITAL_COMMUNITY): Payer: Self-pay | Admitting: Internal Medicine

## 2023-06-27 NOTE — Telephone Encounter (Signed)
Patient aware of facility/hospital based billing Voiced understanding Reports may consider different cardiologist in the future. Not willing to deal with billings

## 2023-08-07 ENCOUNTER — Other Ambulatory Visit (HOSPITAL_COMMUNITY): Payer: Self-pay | Admitting: Internal Medicine

## 2023-09-05 ENCOUNTER — Other Ambulatory Visit (HOSPITAL_COMMUNITY): Payer: Self-pay | Admitting: Internal Medicine

## 2023-09-26 ENCOUNTER — Encounter (HOSPITAL_COMMUNITY): Payer: Self-pay | Admitting: Internal Medicine

## 2023-10-27 ENCOUNTER — Ambulatory Visit (INDEPENDENT_AMBULATORY_CARE_PROVIDER_SITE_OTHER): Payer: Self-pay

## 2023-10-27 DIAGNOSIS — I447 Left bundle-branch block, unspecified: Secondary | ICD-10-CM

## 2023-10-27 LAB — CUP PACEART REMOTE DEVICE CHECK
Battery Remaining Longevity: 5 mo
Battery Remaining Percentage: 6 %
Battery Voltage: 2.63 V
Brady Statistic AP VP Percent: 1.6 %
Brady Statistic AP VS Percent: 1 %
Brady Statistic AS VP Percent: 94 %
Brady Statistic AS VS Percent: 2.9 %
Brady Statistic RA Percent Paced: 1.1 %
Date Time Interrogation Session: 20241114020018
HighPow Impedance: 71 Ohm
HighPow Impedance: 71 Ohm
Implantable Lead Connection Status: 753985
Implantable Lead Connection Status: 753985
Implantable Lead Connection Status: 753985
Implantable Lead Implant Date: 20170426
Implantable Lead Implant Date: 20170426
Implantable Lead Implant Date: 20170426
Implantable Lead Location: 753858
Implantable Lead Location: 753859
Implantable Lead Location: 753860
Implantable Lead Model: 293
Implantable Lead Serial Number: 385378
Implantable Pulse Generator Implant Date: 20170426
Lead Channel Impedance Value: 350 Ohm
Lead Channel Impedance Value: 480 Ohm
Lead Channel Impedance Value: 760 Ohm
Lead Channel Pacing Threshold Amplitude: 0.5 V
Lead Channel Pacing Threshold Amplitude: 0.75 V
Lead Channel Pacing Threshold Amplitude: 1.125 V
Lead Channel Pacing Threshold Pulse Width: 0.4 ms
Lead Channel Pacing Threshold Pulse Width: 0.4 ms
Lead Channel Pacing Threshold Pulse Width: 0.8 ms
Lead Channel Sensing Intrinsic Amplitude: 3.4 mV
Lead Channel Sensing Intrinsic Amplitude: 8.8 mV
Lead Channel Setting Pacing Amplitude: 1.25 V
Lead Channel Setting Pacing Amplitude: 2.125
Lead Channel Setting Pacing Amplitude: 2.5 V
Lead Channel Setting Pacing Pulse Width: 0.4 ms
Lead Channel Setting Pacing Pulse Width: 0.8 ms
Lead Channel Setting Sensing Sensitivity: 0.5 mV
Pulse Gen Serial Number: 7307210

## 2023-11-09 NOTE — Progress Notes (Signed)
Remote ICD transmission.   

## 2023-11-11 ENCOUNTER — Other Ambulatory Visit (HOSPITAL_COMMUNITY): Payer: Self-pay | Admitting: Internal Medicine

## 2023-11-14 ENCOUNTER — Encounter (HOSPITAL_COMMUNITY): Payer: Self-pay | Admitting: Internal Medicine

## 2023-11-14 NOTE — Telephone Encounter (Signed)
PA needed

## 2023-11-15 ENCOUNTER — Encounter (HOSPITAL_COMMUNITY): Payer: Self-pay

## 2023-11-15 ENCOUNTER — Telehealth (HOSPITAL_COMMUNITY): Payer: Self-pay | Admitting: Pharmacy Technician

## 2023-11-15 NOTE — Telephone Encounter (Signed)
Patient Advocate Encounter   Received notification from [employer] that prior authorization for Travis Cline is required.   PA submitted on CoverMyMeds Key [id number] Status is pending   Will continue to follow.

## 2023-11-15 NOTE — Telephone Encounter (Signed)
Advanced Heart Failure Patient Advocate Encounter  Prior Authorization for Sherryll Burger has been approved.    PA# [id number] Effective dates: 11/15/23 through 11/14/24  Updated patient via mychart.  Archer Asa, CPhT

## 2023-11-17 ENCOUNTER — Other Ambulatory Visit (HOSPITAL_COMMUNITY): Payer: Self-pay

## 2023-11-17 MED ORDER — SIMVASTATIN 20 MG PO TABS: 20.0000 mg | ORAL_TABLET | Freq: Every day | ORAL | 3 refills | Status: DC

## 2023-11-17 MED ORDER — EPLERENONE 25 MG PO TABS: 25.0000 mg | ORAL_TABLET | Freq: Every day | ORAL | 3 refills | Status: DC

## 2023-11-17 MED ORDER — TORSEMIDE 20 MG PO TABS: 20.0000 mg | ORAL_TABLET | Freq: Every day | ORAL | 3 refills | Status: AC

## 2023-11-17 MED ORDER — CARVEDILOL 25 MG PO TABS: ORAL_TABLET | ORAL | 3 refills | Status: DC

## 2023-11-17 MED ORDER — AMLODIPINE BESYLATE 5 MG PO TABS: 5.0000 mg | ORAL_TABLET | Freq: Every day | ORAL | 3 refills | Status: DC

## 2023-11-17 MED ORDER — EMPAGLIFLOZIN 10 MG PO TABS: ORAL_TABLET | ORAL | 3 refills | Status: DC

## 2023-11-17 MED ORDER — ENTRESTO 49-51 MG PO TABS: 1.0000 | ORAL_TABLET | Freq: Two times a day (BID) | ORAL | 3 refills | Status: DC

## 2023-11-17 NOTE — Telephone Encounter (Signed)
Yes, insurance will pay for 90 days.

## 2023-11-28 ENCOUNTER — Ambulatory Visit (INDEPENDENT_AMBULATORY_CARE_PROVIDER_SITE_OTHER): Payer: Self-pay

## 2023-11-28 DIAGNOSIS — I447 Left bundle-branch block, unspecified: Secondary | ICD-10-CM

## 2023-11-28 LAB — CUP PACEART REMOTE DEVICE CHECK
Battery Remaining Longevity: 5 mo
Battery Remaining Percentage: 6 %
Battery Voltage: 2.63 V
Brady Statistic AP VP Percent: 1.6 %
Brady Statistic AP VS Percent: 1 %
Brady Statistic AS VP Percent: 94 %
Brady Statistic AS VS Percent: 3 %
Brady Statistic RA Percent Paced: 1.1 %
Date Time Interrogation Session: 20241216020019
HighPow Impedance: 78 Ohm
HighPow Impedance: 78 Ohm
Implantable Lead Connection Status: 753985
Implantable Lead Connection Status: 753985
Implantable Lead Connection Status: 753985
Implantable Lead Implant Date: 20170426
Implantable Lead Implant Date: 20170426
Implantable Lead Implant Date: 20170426
Implantable Lead Location: 753858
Implantable Lead Location: 753859
Implantable Lead Location: 753860
Implantable Lead Model: 293
Implantable Lead Serial Number: 385378
Implantable Pulse Generator Implant Date: 20170426
Lead Channel Impedance Value: 400 Ohm
Lead Channel Impedance Value: 530 Ohm
Lead Channel Impedance Value: 800 Ohm
Lead Channel Pacing Threshold Amplitude: 0.5 V
Lead Channel Pacing Threshold Amplitude: 0.75 V
Lead Channel Pacing Threshold Amplitude: 1.25 V
Lead Channel Pacing Threshold Pulse Width: 0.4 ms
Lead Channel Pacing Threshold Pulse Width: 0.4 ms
Lead Channel Pacing Threshold Pulse Width: 0.8 ms
Lead Channel Sensing Intrinsic Amplitude: 3.4 mV
Lead Channel Sensing Intrinsic Amplitude: 6.9 mV
Lead Channel Setting Pacing Amplitude: 1.25 V
Lead Channel Setting Pacing Amplitude: 2.25 V
Lead Channel Setting Pacing Amplitude: 2.5 V
Lead Channel Setting Pacing Pulse Width: 0.4 ms
Lead Channel Setting Pacing Pulse Width: 0.8 ms
Lead Channel Setting Sensing Sensitivity: 0.5 mV
Pulse Gen Serial Number: 7307210

## 2023-12-11 ENCOUNTER — Other Ambulatory Visit (HOSPITAL_COMMUNITY): Payer: Self-pay | Admitting: Internal Medicine

## 2023-12-15 ENCOUNTER — Encounter: Payer: Self-pay | Admitting: Internal Medicine

## 2023-12-15 ENCOUNTER — Other Ambulatory Visit (HOSPITAL_COMMUNITY): Payer: Self-pay | Admitting: Internal Medicine

## 2023-12-15 DIAGNOSIS — I5022 Chronic systolic (congestive) heart failure: Secondary | ICD-10-CM

## 2023-12-29 ENCOUNTER — Ambulatory Visit (INDEPENDENT_AMBULATORY_CARE_PROVIDER_SITE_OTHER): Payer: Self-pay

## 2023-12-29 DIAGNOSIS — I447 Left bundle-branch block, unspecified: Secondary | ICD-10-CM

## 2023-12-29 LAB — CUP PACEART REMOTE DEVICE CHECK
Battery Remaining Longevity: 3 mo
Battery Remaining Percentage: 3 %
Battery Voltage: 2.62 V
Brady Statistic AP VP Percent: 1.6 %
Brady Statistic AP VS Percent: 1 %
Brady Statistic AS VP Percent: 94 %
Brady Statistic AS VS Percent: 3.1 %
Brady Statistic RA Percent Paced: 1.1 %
Date Time Interrogation Session: 20250116020017
HighPow Impedance: 82 Ohm
HighPow Impedance: 82 Ohm
Implantable Lead Connection Status: 753985
Implantable Lead Connection Status: 753985
Implantable Lead Connection Status: 753985
Implantable Lead Implant Date: 20170426
Implantable Lead Implant Date: 20170426
Implantable Lead Implant Date: 20170426
Implantable Lead Location: 753858
Implantable Lead Location: 753859
Implantable Lead Location: 753860
Implantable Lead Model: 293
Implantable Lead Serial Number: 385378
Implantable Pulse Generator Implant Date: 20170426
Lead Channel Impedance Value: 400 Ohm
Lead Channel Impedance Value: 530 Ohm
Lead Channel Impedance Value: 840 Ohm
Lead Channel Pacing Threshold Amplitude: 0.5 V
Lead Channel Pacing Threshold Amplitude: 0.75 V
Lead Channel Pacing Threshold Amplitude: 1.125 V
Lead Channel Pacing Threshold Pulse Width: 0.4 ms
Lead Channel Pacing Threshold Pulse Width: 0.4 ms
Lead Channel Pacing Threshold Pulse Width: 0.8 ms
Lead Channel Sensing Intrinsic Amplitude: 3.7 mV
Lead Channel Sensing Intrinsic Amplitude: 6.7 mV
Lead Channel Setting Pacing Amplitude: 1.25 V
Lead Channel Setting Pacing Amplitude: 2.125
Lead Channel Setting Pacing Amplitude: 2.5 V
Lead Channel Setting Pacing Pulse Width: 0.4 ms
Lead Channel Setting Pacing Pulse Width: 0.8 ms
Lead Channel Setting Sensing Sensitivity: 0.5 mV
Pulse Gen Serial Number: 7307210

## 2024-01-02 NOTE — Progress Notes (Signed)
Remote pacemaker transmission.   

## 2024-01-15 ENCOUNTER — Other Ambulatory Visit (HOSPITAL_COMMUNITY): Payer: Self-pay | Admitting: Internal Medicine

## 2024-01-25 ENCOUNTER — Other Ambulatory Visit (HOSPITAL_COMMUNITY): Payer: Self-pay | Admitting: Internal Medicine

## 2024-01-30 ENCOUNTER — Ambulatory Visit (INDEPENDENT_AMBULATORY_CARE_PROVIDER_SITE_OTHER): Payer: Self-pay

## 2024-01-30 DIAGNOSIS — I5022 Chronic systolic (congestive) heart failure: Secondary | ICD-10-CM

## 2024-01-30 DIAGNOSIS — I428 Other cardiomyopathies: Secondary | ICD-10-CM

## 2024-01-30 LAB — CUP PACEART REMOTE DEVICE CHECK
Battery Remaining Longevity: 4 mo
Battery Remaining Percentage: 4 %
Battery Voltage: 2.62 V
Brady Statistic AP VP Percent: 1.6 %
Brady Statistic AP VS Percent: 1 %
Brady Statistic AS VP Percent: 94 %
Brady Statistic AS VS Percent: 3 %
Brady Statistic RA Percent Paced: 1 %
Date Time Interrogation Session: 20250216020016
HighPow Impedance: 68 Ohm
HighPow Impedance: 68 Ohm
Implantable Lead Connection Status: 753985
Implantable Lead Connection Status: 753985
Implantable Lead Connection Status: 753985
Implantable Lead Implant Date: 20170426
Implantable Lead Implant Date: 20170426
Implantable Lead Implant Date: 20170426
Implantable Lead Location: 753858
Implantable Lead Location: 753859
Implantable Lead Location: 753860
Implantable Lead Model: 293
Implantable Lead Serial Number: 385378
Implantable Pulse Generator Implant Date: 20170426
Lead Channel Impedance Value: 340 Ohm
Lead Channel Impedance Value: 480 Ohm
Lead Channel Impedance Value: 730 Ohm
Lead Channel Pacing Threshold Amplitude: 0.5 V
Lead Channel Pacing Threshold Amplitude: 0.75 V
Lead Channel Pacing Threshold Amplitude: 1.25 V
Lead Channel Pacing Threshold Pulse Width: 0.4 ms
Lead Channel Pacing Threshold Pulse Width: 0.4 ms
Lead Channel Pacing Threshold Pulse Width: 0.8 ms
Lead Channel Sensing Intrinsic Amplitude: 3.8 mV
Lead Channel Sensing Intrinsic Amplitude: 6.9 mV
Lead Channel Setting Pacing Amplitude: 1.25 V
Lead Channel Setting Pacing Amplitude: 2.25 V
Lead Channel Setting Pacing Amplitude: 2.5 V
Lead Channel Setting Pacing Pulse Width: 0.4 ms
Lead Channel Setting Pacing Pulse Width: 0.8 ms
Lead Channel Setting Sensing Sensitivity: 0.5 mV
Pulse Gen Serial Number: 7307210

## 2024-02-06 NOTE — Progress Notes (Signed)
 Remote pacemaker transmission.

## 2024-02-08 ENCOUNTER — Encounter: Payer: Self-pay | Admitting: Internal Medicine

## 2024-02-12 ENCOUNTER — Other Ambulatory Visit (HOSPITAL_COMMUNITY): Payer: Self-pay | Admitting: Internal Medicine

## 2024-02-28 ENCOUNTER — Encounter: Payer: Self-pay | Admitting: Internal Medicine

## 2024-02-29 LAB — CUP PACEART REMOTE DEVICE CHECK
Battery Remaining Longevity: 1 mo
Battery Remaining Percentage: 3 %
Battery Voltage: 2.6 V
Brady Statistic AP VP Percent: 1.6 %
Brady Statistic AP VS Percent: 1 %
Brady Statistic AS VP Percent: 94 %
Brady Statistic AS VS Percent: 3 %
Brady Statistic RA Percent Paced: 1 %
Date Time Interrogation Session: 20250319020018
HighPow Impedance: 82 Ohm
HighPow Impedance: 82 Ohm
Implantable Lead Connection Status: 753985
Implantable Lead Connection Status: 753985
Implantable Lead Connection Status: 753985
Implantable Lead Implant Date: 20170426
Implantable Lead Implant Date: 20170426
Implantable Lead Implant Date: 20170426
Implantable Lead Location: 753858
Implantable Lead Location: 753859
Implantable Lead Location: 753860
Implantable Lead Model: 293
Implantable Lead Serial Number: 385378
Implantable Pulse Generator Implant Date: 20170426
Lead Channel Impedance Value: 380 Ohm
Lead Channel Impedance Value: 530 Ohm
Lead Channel Impedance Value: 800 Ohm
Lead Channel Pacing Threshold Amplitude: 0.5 V
Lead Channel Pacing Threshold Amplitude: 0.75 V
Lead Channel Pacing Threshold Amplitude: 1.125 V
Lead Channel Pacing Threshold Pulse Width: 0.4 ms
Lead Channel Pacing Threshold Pulse Width: 0.4 ms
Lead Channel Pacing Threshold Pulse Width: 0.8 ms
Lead Channel Sensing Intrinsic Amplitude: 3.7 mV
Lead Channel Sensing Intrinsic Amplitude: 5.2 mV
Lead Channel Setting Pacing Amplitude: 1.25 V
Lead Channel Setting Pacing Amplitude: 2.125
Lead Channel Setting Pacing Amplitude: 2.5 V
Lead Channel Setting Pacing Pulse Width: 0.4 ms
Lead Channel Setting Pacing Pulse Width: 0.8 ms
Lead Channel Setting Sensing Sensitivity: 0.5 mV
Pulse Gen Serial Number: 7307210

## 2024-03-01 ENCOUNTER — Ambulatory Visit: Payer: Self-pay

## 2024-03-01 DIAGNOSIS — I428 Other cardiomyopathies: Secondary | ICD-10-CM

## 2024-03-06 NOTE — Progress Notes (Signed)
 Remote pacemaker transmission.

## 2024-03-18 ENCOUNTER — Other Ambulatory Visit (HOSPITAL_COMMUNITY): Payer: Self-pay | Admitting: Internal Medicine

## 2024-03-18 DIAGNOSIS — I5022 Chronic systolic (congestive) heart failure: Secondary | ICD-10-CM

## 2024-03-24 ENCOUNTER — Encounter: Payer: Self-pay | Admitting: Internal Medicine

## 2024-03-30 ENCOUNTER — Telehealth: Payer: Self-pay

## 2024-03-30 NOTE — Telephone Encounter (Signed)
 Pacemaker battery reached ERI 03/30/24. Patient needs OV to discuss gen change.

## 2024-03-30 NOTE — Telephone Encounter (Signed)
 Patient called and said that his device is vibrating and want to know what is going on.

## 2024-04-02 ENCOUNTER — Ambulatory Visit (INDEPENDENT_AMBULATORY_CARE_PROVIDER_SITE_OTHER): Payer: Self-pay

## 2024-04-02 DIAGNOSIS — I428 Other cardiomyopathies: Secondary | ICD-10-CM

## 2024-04-02 DIAGNOSIS — I5022 Chronic systolic (congestive) heart failure: Secondary | ICD-10-CM

## 2024-04-02 LAB — CUP PACEART REMOTE DEVICE CHECK
Battery Remaining Longevity: 0 mo
Battery Voltage: 2.59 V
Brady Statistic AP VP Percent: 1.6 %
Brady Statistic AP VS Percent: 1 %
Brady Statistic AS VP Percent: 94 %
Brady Statistic AS VS Percent: 3 %
Brady Statistic RA Percent Paced: 1 %
Date Time Interrogation Session: 20250421020016
HighPow Impedance: 75 Ohm
HighPow Impedance: 75 Ohm
Implantable Lead Connection Status: 753985
Implantable Lead Connection Status: 753985
Implantable Lead Connection Status: 753985
Implantable Lead Implant Date: 20170426
Implantable Lead Implant Date: 20170426
Implantable Lead Implant Date: 20170426
Implantable Lead Location: 753858
Implantable Lead Location: 753859
Implantable Lead Location: 753860
Implantable Lead Model: 293
Implantable Lead Serial Number: 385378
Implantable Pulse Generator Implant Date: 20170426
Lead Channel Impedance Value: 410 Ohm
Lead Channel Impedance Value: 530 Ohm
Lead Channel Impedance Value: 800 Ohm
Lead Channel Pacing Threshold Amplitude: 0.5 V
Lead Channel Pacing Threshold Amplitude: 0.75 V
Lead Channel Pacing Threshold Amplitude: 1.125 V
Lead Channel Pacing Threshold Pulse Width: 0.4 ms
Lead Channel Pacing Threshold Pulse Width: 0.4 ms
Lead Channel Pacing Threshold Pulse Width: 0.8 ms
Lead Channel Sensing Intrinsic Amplitude: 3.7 mV
Lead Channel Sensing Intrinsic Amplitude: 6.7 mV
Lead Channel Setting Pacing Amplitude: 1.25 V
Lead Channel Setting Pacing Amplitude: 2.125
Lead Channel Setting Pacing Amplitude: 2.5 V
Lead Channel Setting Pacing Pulse Width: 0.4 ms
Lead Channel Setting Pacing Pulse Width: 0.8 ms
Lead Channel Setting Sensing Sensitivity: 0.5 mV
Pulse Gen Serial Number: 7307210

## 2024-04-12 NOTE — Progress Notes (Signed)
 Remote pacemaker transmission.

## 2024-04-17 NOTE — Progress Notes (Unsigned)
  Electrophysiology Office Note:   ID:  NEVIN WILLENBORG, DOB 09-Aug-1966, MRN [id number]  Primary Cardiologist: None Electrophysiologist: Richardo Chandler, MD  {Click to update primary MD,subspecialty MD or APP then REFRESH:1}    History of Present Illness:   Travis Cline is a 58 y.o. male with h/o NICM s/p CRT-D, HTN, and LBBB seen today for acute visit due to device at [facility].    Seen in HF clinic 04/2023, not seen in person by EP since 04/2019 (and that virtually)  Patient reports ***.  he denies chest pain, palpitations, dyspnea, PND, orthopnea, nausea, vomiting, dizziness, syncope, edema, weight gain, or early satiety.   Review of systems complete and found to be negative unless listed in HPI.   EP Information / Studies Reviewed:    EKG is ordered today. Personal review as below.       ICD Interrogation-  reviewed in detail today,  See PACEART report.  Arrhythmia/Device History Abbott CRT-D 04/07/2016 for CHF/LBBB   Physical Exam:   VS:  There were no vitals taken for this visit.   Wt Readings from Last 3 Encounters:  04/21/23 244 lb 9.6 oz (110.9 kg)  10/08/21 279 lb 3.2 oz (126.6 kg)  11/12/20 280 lb (127 kg)     GEN: No acute distress *** NECK: No JVD; No carotid bruits CARDIAC: {EPRHYTHM:28826}, no murmurs, rubs, gallops RESPIRATORY:  Clear to auscultation without rales, wheezing or rhonchi  ABDOMEN: Soft, non-tender, non-distended EXTREMITIES:  {EDEMA LEVEL:28147::"No"} edema; No deformity   ASSESSMENT AND PLAN:    Chronic systolic CHF  s/p Abbott CRT-D  euvolemic today Stable on an appropriate medical regimen Normal ICD function See Pace Art report No changes today  Disposition:   Follow up with {EPPROVIDERS:28135} {EPFOLLOW UP:28173}   Signed, Tylene Galla, PA-C

## 2024-04-19 ENCOUNTER — Encounter: Payer: Self-pay | Admitting: Student

## 2024-04-19 ENCOUNTER — Other Ambulatory Visit: Payer: Self-pay | Admitting: *Deleted

## 2024-04-19 ENCOUNTER — Ambulatory Visit: Attending: Student | Admitting: Student

## 2024-04-19 ENCOUNTER — Encounter: Payer: Self-pay | Admitting: *Deleted

## 2024-04-19 VITALS — BP 124/80 | HR 98 | Ht 70.0 in | Wt 241.5 lb

## 2024-04-19 DIAGNOSIS — I428 Other cardiomyopathies: Secondary | ICD-10-CM

## 2024-04-19 DIAGNOSIS — I447 Left bundle-branch block, unspecified: Secondary | ICD-10-CM

## 2024-04-19 DIAGNOSIS — I10 Essential (primary) hypertension: Secondary | ICD-10-CM

## 2024-04-19 DIAGNOSIS — I5022 Chronic systolic (congestive) heart failure: Secondary | ICD-10-CM

## 2024-04-19 NOTE — Patient Instructions (Addendum)
 Medication Instructions:  Your physician recommends that you continue on your current medications as directed. Please refer to the Current Medication list given to you today.  *If you need a refill on your cardiac medications before your next appointment, please call your pharmacy*  Lab Work: BMET, CBC-within 30 days of procedure You will need to go to any [facility] for these labs. There is a [facility] in our building on the 1st floor or you can call [phone] or visit [email or web address] to find a lab near you. - You do NOT need an appointment for this. If you have labs (blood work) drawn today and your tests are completely normal, you will receive your results only by: MyChart Message (if you have MyChart) OR A paper copy in the mail If you have any lab test that is abnormal or we need to change your treatment, we will call you to review the results.  Testing/Procedures: See letter  Follow-Up: At [facility], you and your health needs are our priority.  As part of our continuing mission to provide you with exceptional heart care, our providers are all part of one team.  This team includes your primary Cardiologist (physician) and Advanced Practice Providers or APPs (Physician Assistants and Nurse Practitioners) who all work together to provide you with the care you need, when you need it.  Your next appointment:   Follow up will be arranged for you and print out on your discharge summary after your procedure.

## 2024-05-03 ENCOUNTER — Ambulatory Visit: Payer: Self-pay

## 2024-05-03 DIAGNOSIS — I428 Other cardiomyopathies: Secondary | ICD-10-CM

## 2024-05-10 LAB — CUP PACEART REMOTE DEVICE CHECK
Battery Remaining Longevity: 0 mo
Battery Voltage: 2.59 V
Brady Statistic AP VP Percent: 1 %
Brady Statistic AP VS Percent: 1 %
Brady Statistic AS VP Percent: 98 %
Brady Statistic AS VS Percent: 1.5 %
Brady Statistic RA Percent Paced: 1 %
Date Time Interrogation Session: 20250522020015
HighPow Impedance: 78 Ohm
HighPow Impedance: 78 Ohm
Implantable Lead Connection Status: 753985
Implantable Lead Connection Status: 753985
Implantable Lead Connection Status: 753985
Implantable Lead Implant Date: 20170426
Implantable Lead Implant Date: 20170426
Implantable Lead Implant Date: 20170426
Implantable Lead Location: 753858
Implantable Lead Location: 753859
Implantable Lead Location: 753860
Implantable Lead Model: 293
Implantable Lead Serial Number: 385378
Implantable Pulse Generator Implant Date: 20170426
Lead Channel Impedance Value: 340 Ohm
Lead Channel Impedance Value: 460 Ohm
Lead Channel Impedance Value: 680 Ohm
Lead Channel Pacing Threshold Amplitude: 0.75 V
Lead Channel Pacing Threshold Amplitude: 0.75 V
Lead Channel Pacing Threshold Amplitude: 1.125 V
Lead Channel Pacing Threshold Pulse Width: 0.4 ms
Lead Channel Pacing Threshold Pulse Width: 0.4 ms
Lead Channel Pacing Threshold Pulse Width: 0.8 ms
Lead Channel Sensing Intrinsic Amplitude: 3.3 mV
Lead Channel Sensing Intrinsic Amplitude: 5 mV
Lead Channel Setting Pacing Amplitude: 1.25 V
Lead Channel Setting Pacing Amplitude: 2.125
Lead Channel Setting Pacing Amplitude: 2.5 V
Lead Channel Setting Pacing Pulse Width: 0.4 ms
Lead Channel Setting Pacing Pulse Width: 0.8 ms
Lead Channel Setting Sensing Sensitivity: 0.5 mV
Pulse Gen Serial Number: 7307210

## 2024-05-13 ENCOUNTER — Ambulatory Visit: Payer: Self-pay | Admitting: Cardiology

## 2024-05-18 NOTE — Progress Notes (Signed)
 Remote pacemaker transmission.

## 2024-05-22 ENCOUNTER — Encounter: Payer: Self-pay | Admitting: Cardiology

## 2024-05-22 NOTE — Telephone Encounter (Signed)
 Can either of you help this pt please?

## 2024-05-30 ENCOUNTER — Ambulatory Visit: Payer: Self-pay | Admitting: Student

## 2024-05-30 LAB — CBC
Hematocrit: 54.1 % — ABNORMAL HIGH (ref 37.5–51.0)
Hemoglobin: 18.2 g/dL — ABNORMAL HIGH (ref 13.0–17.7)
MCH: 31 pg (ref 26.6–33.0)
MCHC: 33.6 g/dL (ref 31.5–35.7)
MCV: 92 fL (ref 79–97)
Platelets: 160 10*3/uL (ref 150–450)
RBC: 5.87 x10E6/uL — ABNORMAL HIGH (ref 4.14–5.80)
RDW: 12.7 % (ref 11.6–15.4)
WBC: 8.5 10*3/uL (ref 3.4–10.8)

## 2024-05-30 LAB — BASIC METABOLIC PANEL WITH GFR
BUN/Creatinine Ratio: 20 (ref 9–20)
BUN: 17 mg/dL (ref 6–24)
CO2: 21 mmol/L (ref 20–29)
Calcium: 11.8 mg/dL — ABNORMAL HIGH (ref 8.7–10.2)
Chloride: 104 mmol/L (ref 96–106)
Creatinine, Ser: 0.83 mg/dL (ref 0.76–1.27)
Glucose: 91 mg/dL (ref 70–99)
Potassium: 4.8 mmol/L (ref 3.5–5.2)
Sodium: 140 mmol/L (ref 134–144)
eGFR: 102 mL/min/{1.73_m2} (ref >59)

## 2024-06-04 ENCOUNTER — Ambulatory Visit (INDEPENDENT_AMBULATORY_CARE_PROVIDER_SITE_OTHER): Payer: Self-pay

## 2024-06-04 DIAGNOSIS — I428 Other cardiomyopathies: Secondary | ICD-10-CM

## 2024-06-04 DIAGNOSIS — I5022 Chronic systolic (congestive) heart failure: Secondary | ICD-10-CM

## 2024-06-04 LAB — CUP PACEART REMOTE DEVICE CHECK
Battery Remaining Longevity: 0 mo
Battery Voltage: 2.57 V
Brady Statistic AP VP Percent: 1 %
Brady Statistic AP VS Percent: 1 %
Brady Statistic AS VP Percent: 99 %
Brady Statistic AS VS Percent: 1 %
Brady Statistic RA Percent Paced: 1 %
Date Time Interrogation Session: 20250623023434
HighPow Impedance: 80 Ohm
HighPow Impedance: 80 Ohm
Implantable Lead Connection Status: 753985
Implantable Lead Connection Status: 753985
Implantable Lead Connection Status: 753985
Implantable Lead Implant Date: 20170426
Implantable Lead Implant Date: 20170426
Implantable Lead Implant Date: 20170426
Implantable Lead Location: 753858
Implantable Lead Location: 753859
Implantable Lead Location: 753860
Implantable Lead Model: 293
Implantable Lead Serial Number: 385378
Implantable Pulse Generator Implant Date: 20170426
Lead Channel Impedance Value: 400 Ohm
Lead Channel Impedance Value: 540 Ohm
Lead Channel Impedance Value: 830 Ohm
Lead Channel Pacing Threshold Amplitude: 0.75 V
Lead Channel Pacing Threshold Amplitude: 0.75 V
Lead Channel Pacing Threshold Amplitude: 1.125 V
Lead Channel Pacing Threshold Pulse Width: 0.4 ms
Lead Channel Pacing Threshold Pulse Width: 0.4 ms
Lead Channel Pacing Threshold Pulse Width: 0.8 ms
Lead Channel Sensing Intrinsic Amplitude: 4.1 mV
Lead Channel Sensing Intrinsic Amplitude: 7.8 mV
Lead Channel Setting Pacing Amplitude: 1.25 V
Lead Channel Setting Pacing Amplitude: 2.125
Lead Channel Setting Pacing Amplitude: 2.5 V
Lead Channel Setting Pacing Pulse Width: 0.4 ms
Lead Channel Setting Pacing Pulse Width: 0.8 ms
Lead Channel Setting Sensing Sensitivity: 0.5 mV
Pulse Gen Serial Number: 7307210

## 2024-06-06 ENCOUNTER — Telehealth (HOSPITAL_COMMUNITY): Payer: Self-pay

## 2024-06-06 NOTE — Telephone Encounter (Signed)
 Spoke with patient to discuss upcoming procedure.   Confirmed patient is scheduled for a ICD generator change on Monday, July 7 with Dr. Soyla Norton. Instructed patient to arrive at the [facility] of [facility]: [address] [place], [place], [place] [address] and check in at 6:30 AM.   Labs completed  Any recent signs of acute illness or been started on antibiotics?  NO Any new medications started? NO Any medications to hold? Hold Ozempic  for 7 days prior to procedure- last dose on June 29. Patient reports he will stop on June 22. Hold Jardiance  for 3 days prior to procedure- last dose on July 3. Hold Aspirin  for 2 days prior to procedure- last dose July 4. Medication instructions:  On the morning of your procedure DO NOT take any medication. No eating or drinking after midnight prior to procedure.   The night before your procedure and the morning of your procedure, wash thoroughly with the CHG surgical soap from the neck down, paying special attention to the area where your procedure will be performed.  Advised of plan to go home the same day and will only stay overnight if medically necessary. You MUST have a responsible adult to drive you home and MUST be with you the first 24 hours after you arrive home.  Patient verbalized understanding to all instructions provided and agreed to proceed with procedure.

## 2024-06-13 ENCOUNTER — Encounter: Payer: Self-pay | Admitting: Emergency Medicine

## 2024-06-14 NOTE — Progress Notes (Signed)
 Remote pacemaker transmission.

## 2024-06-14 NOTE — Addendum Note (Signed)
 Addended by: VICCI SELLER A on: 06/14/2024 10:59 AM   Modules accepted: Orders, Level of Service

## 2024-06-18 ENCOUNTER — Other Ambulatory Visit: Payer: Self-pay

## 2024-06-18 ENCOUNTER — Encounter
Admission: RE | Disposition: A | Discharge status: Home / Self Care | Payer: Self-pay | Source: Ambulatory Visit | Attending: Cardiology

## 2024-06-18 ENCOUNTER — Encounter: Payer: Self-pay | Admitting: Cardiology

## 2024-06-18 ENCOUNTER — Ambulatory Visit
Admission: RE | Admit: 2024-06-18 | Discharge: 2024-06-18 | Disposition: A | Discharge status: Home / Self Care | Source: Ambulatory Visit | Attending: Cardiology | Admitting: Cardiology

## 2024-06-18 DIAGNOSIS — Z45010 Encounter for checking and testing of cardiac pacemaker pulse generator [battery]: Secondary | ICD-10-CM | POA: Diagnosis not present

## 2024-06-18 DIAGNOSIS — I110 Hypertensive heart disease with heart failure: Secondary | ICD-10-CM | POA: Diagnosis not present

## 2024-06-18 DIAGNOSIS — I5022 Chronic systolic (congestive) heart failure: Secondary | ICD-10-CM | POA: Diagnosis not present

## 2024-06-18 DIAGNOSIS — Z79899 Other long term (current) drug therapy: Secondary | ICD-10-CM | POA: Diagnosis not present

## 2024-06-18 DIAGNOSIS — I428 Other cardiomyopathies: Secondary | ICD-10-CM | POA: Insufficient documentation

## 2024-06-18 DIAGNOSIS — I447 Left bundle-branch block, unspecified: Secondary | ICD-10-CM | POA: Insufficient documentation

## 2024-06-18 DIAGNOSIS — Z4502 Encounter for adjustment and management of automatic implantable cardiac defibrillator: Secondary | ICD-10-CM | POA: Diagnosis present

## 2024-06-18 HISTORY — PX: ICD GENERATOR CHANGEOUT: EP1231

## 2024-06-18 LAB — GLUCOSE, CAPILLARY: Glucose-Capillary: 151 mg/dL — ABNORMAL HIGH (ref 70–99)

## 2024-06-18 SURGERY — ICD GENERATOR CHANGEOUT
Anesthesia: Moderate Sedation

## 2024-06-18 MED ORDER — POVIDONE-IODINE 10 % EX SWAB
2.0000 | Freq: Once | CUTANEOUS | Status: AC
  Administered 2024-06-18: 2 via TOPICAL

## 2024-06-18 MED ORDER — MIDAZOLAM HCL 2 MG/2ML IJ SOLN
INTRAMUSCULAR | Status: DC | PRN
  Administered 2024-06-18: 2 mg via INTRAVENOUS

## 2024-06-18 MED ORDER — MIDAZOLAM HCL 2 MG/2ML IJ SOLN
INTRAMUSCULAR | Status: AC
  Filled 2024-06-18: qty 2

## 2024-06-18 MED ORDER — CHLORHEXIDINE GLUCONATE CLOTH 2 % EX PADS
6.0000 | MEDICATED_PAD | Freq: Every day | CUTANEOUS | Status: DC
  Administered 2024-06-18: 6 via TOPICAL

## 2024-06-18 MED ORDER — CEFAZOLIN SODIUM-DEXTROSE 2-4 GM/100ML-% IV SOLN
INTRAVENOUS | Status: AC
Start: 2024-06-18 — End: 2024-06-18
  Filled 2024-06-18: qty 100

## 2024-06-18 MED ORDER — CHLORHEXIDINE GLUCONATE 4 % EX SOLN: 4.0000 | Freq: Once | CUTANEOUS | Status: DC

## 2024-06-18 MED ORDER — FENTANYL CITRATE (PF) 100 MCG/2ML IJ SOLN
INTRAMUSCULAR | Status: DC | PRN
  Administered 2024-06-18: 25 ug via INTRAVENOUS

## 2024-06-18 MED ORDER — SODIUM CHLORIDE 0.9 % IV SOLN: INTRAVENOUS | Status: DC

## 2024-06-18 MED ORDER — ONDANSETRON HCL 4 MG/2ML IJ SOLN
4.0000 mg | Freq: Four times a day (QID) | INTRAMUSCULAR | Status: DC | PRN
Start: 2024-06-18 — End: 2024-06-18

## 2024-06-18 MED ORDER — LIDOCAINE HCL (PF) 1 % IJ SOLN
INTRAMUSCULAR | Status: DC | PRN
  Administered 2024-06-18: 60 mL

## 2024-06-18 MED ORDER — LIDOCAINE HCL (PF) 1 % IJ SOLN
INTRAMUSCULAR | Status: AC
  Filled 2024-06-18: qty 60

## 2024-06-18 MED ORDER — ACETAMINOPHEN 325 MG PO TABS: 325.0000 mg | ORAL_TABLET | ORAL | Status: DC | PRN

## 2024-06-18 MED ORDER — CEFAZOLIN SODIUM-DEXTROSE 2-4 GM/100ML-% IV SOLN
2.0000 g | INTRAVENOUS | Status: AC
  Administered 2024-06-18: 2 g via INTRAVENOUS

## 2024-06-18 MED ORDER — SODIUM CHLORIDE 0.9 % IV SOLN
80.0000 mg | INTRAVENOUS | Status: AC
  Administered 2024-06-18: 80 mg
  Filled 2024-06-18: qty 2

## 2024-06-18 MED ORDER — FENTANYL CITRATE (PF) 100 MCG/2ML IJ SOLN
INTRAMUSCULAR | Status: AC
  Filled 2024-06-18: qty 2

## 2024-06-18 SURGICAL SUPPLY — 8 items
CABLE SURG 12 DISP A/V CHANNEL (MISCELLANEOUS) IMPLANT
DEVICE DSSCT PLSMBLD 3.0S LGHT (MISCELLANEOUS) IMPLANT
ICD GALLANT HFCRTD CDHFA500Q (ICD Generator) IMPLANT
POUCH AIGIS-R ANTIBACT ICD LRG (Mesh General) IMPLANT
SUT VIC AB 2-0 CT2 27 (SUTURE) IMPLANT
SUT VIC AB 3-0 SH 27X BRD (SUTURE) IMPLANT
SUT VIC AB 3-0 X1 27 (SUTURE) IMPLANT
TRAY PACEMAKER INSERTION (PACKS) ×1 IMPLANT

## 2024-06-18 NOTE — Discharge Instructions (Signed)

## 2024-06-18 NOTE — H&P (Signed)
  Electrophysiology Office Note:   ID:  Travis Cline, DOB [birth year], MRN [id number]  Primary Cardiologist: None Electrophysiologist: Landrum Carbonell Gladis Norton, MD      History of Present Illness:   Travis Cline is a 58 y.o. male with h/o NICM s/p CRT-D, HTN, and LBBB seen today for acute visit due to device at [facility].    Seen in HF clinic 04/2023, not seen in person by EP since 04/2019 (and that virtually)  Today, denies symptoms of palpitations, chest pain, dyspnea, orthopnea, PND, lower extremity edema, claudication, dizziness, presyncope, syncope, bleeding, or neurologic sequela. The patient is tolerating medications without difficulties. Plan gen change today.   EP Information / Studies Reviewed:    EKG is ordered today. Personal review as below.       ICD Interrogation-  reviewed in detail today,  See PACEART report.  Arrhythmia/Device History Abbott CRT-D 04/07/2016 for CHF/LBBB   Physical Exam:   VS:  BP (!) 140/93   Pulse 87   Temp 98.3 F (36.8 C) (Oral)   Resp 14   Ht 5' 10 (1.778 m)   Wt 109.3 kg   SpO2 (!) 17%   BMI 34.58 kg/m    Wt Readings from Last 3 Encounters:  06/18/24 109.3 kg  04/19/24 109.5 kg  04/21/23 110.9 kg    GEN: No acute distress.   Neck: No JVD Cardiac: RRR, no murmurs, rubs, or gallops.  Respiratory: normal BS bilaterally. GI: Soft, nontender, non-distended  MS: No edema; No deformity. Neuro:  Nonfocal  Skin: warm and dry Psych: Normal affect     ASSESSMENT AND PLAN:    Chronic systolic CHF  s/p Abbott CRT-D  Travis Cline has presented today for surgery, with the diagnosis of ERI.  The various methods of treatment have been discussed with the patient and family. After consideration of risks, benefits and other options for treatment, the patient has consented to  Procedure(s): Generator change as a surgical intervention .  Risks include but not limited to bleeding, infection, pneumothorax, perforation, tamponade, vascular damage,  renal failure, MI, stroke, death, and lead dislodgement . The patient's history has been reviewed, patient examined, no change in status, stable for surgery.  I have reviewed the patient's chart and labs.  Questions were answered to the patient's satisfaction.    Travis Ju Norton, MD 06/18/2024 7:31 AM

## 2024-06-19 ENCOUNTER — Telehealth: Payer: Self-pay | Admitting: Cardiology

## 2024-06-19 ENCOUNTER — Encounter: Payer: Self-pay | Admitting: Cardiology

## 2024-06-19 NOTE — Telephone Encounter (Signed)
 Follow-up after same day discharge: Implant date: 06/18/2024 MD: Soyla Norton, MD Device: Abbott ICD   Location: Left Chest    Wound check visit: 07/03/2024 90 day MD follow-up: 09/18/2024  Remote Transmission received:06/19/2024  Dressing/sling removed: No sling, bandage still on.     Confirm OAC restart on: Not on an OAC  Please continue to monitor your cardiac device site for redness, swelling, and drainage. Call the device clinic at [phone] if you experience these symptoms, fever/chills, or have questions about your device.   Remote monitoring is used to monitor your cardiac device from home. This monitoring is scheduled every 91 days by our office. It allows us  to keep an eye on the functioning of your device to ensure it is working properly.  I asked the pt to send a picture of his wound site for the nurse to review. Once the nurse looks at the picture someone will give him a call back. My Chart with picture has been sent.

## 2024-06-19 NOTE — Telephone Encounter (Signed)
 Patient is concerned the site where he had surgery yesterday is bleeding and is about the size of a quarter.  Patient wants advice on next steps.

## 2024-06-19 NOTE — Telephone Encounter (Signed)
 See picture in other note.   Patient called and advised to remote the white dressing and if the steri-strips appear saturated or bleeding around the strips to call the device clinic back. Patient voiced understanding and was appreciative of call back.

## 2024-07-03 ENCOUNTER — Ambulatory Visit: Attending: Cardiology

## 2024-07-03 DIAGNOSIS — I428 Other cardiomyopathies: Secondary | ICD-10-CM

## 2024-07-03 LAB — CUP PACEART INCLINIC DEVICE CHECK
Battery Remaining Longevity: 84 mo
Brady Statistic RA Percent Paced: 0.06 %
Brady Statistic RV Percent Paced: 99.44 %
Date Time Interrogation Session: 20250722112410
HighPow Impedance: 61.875
Implantable Lead Connection Status: 753985
Implantable Lead Connection Status: 753985
Implantable Lead Connection Status: 753985
Implantable Lead Implant Date: 20170426
Implantable Lead Implant Date: 20170426
Implantable Lead Implant Date: 20170426
Implantable Lead Location: 753858
Implantable Lead Location: 753859
Implantable Lead Location: 753860
Implantable Lead Model: 293
Implantable Lead Serial Number: 385378
Implantable Pulse Generator Implant Date: 20250707
Lead Channel Impedance Value: 362.5 Ohm
Lead Channel Impedance Value: 500 Ohm
Lead Channel Impedance Value: 725 Ohm
Lead Channel Pacing Threshold Amplitude: 0.625 V
Lead Channel Pacing Threshold Amplitude: 0.75 V
Lead Channel Pacing Threshold Amplitude: 0.75 V
Lead Channel Pacing Threshold Amplitude: 1 V
Lead Channel Pacing Threshold Amplitude: 1 V
Lead Channel Pacing Threshold Pulse Width: 0.5 ms
Lead Channel Pacing Threshold Pulse Width: 0.5 ms
Lead Channel Pacing Threshold Pulse Width: 0.5 ms
Lead Channel Pacing Threshold Pulse Width: 0.5 ms
Lead Channel Pacing Threshold Pulse Width: 0.5 ms
Lead Channel Sensing Intrinsic Amplitude: 4.2 mV
Lead Channel Sensing Intrinsic Amplitude: 4.2 mV
Lead Channel Setting Pacing Amplitude: 1.125
Lead Channel Setting Pacing Amplitude: 2 V
Lead Channel Setting Pacing Amplitude: 2.5 V
Lead Channel Setting Pacing Pulse Width: 0.5 ms
Lead Channel Setting Pacing Pulse Width: 0.5 ms
Lead Channel Setting Sensing Sensitivity: 0.3 mV
Pulse Gen Serial Number: 211040216

## 2024-07-03 NOTE — Patient Instructions (Signed)

## 2024-07-03 NOTE — Progress Notes (Signed)
 Normal multi chamber ICD wound check. Wound well healed. Presenting rhythm: AS/BP 98 . Routine testing performed. Thresholds, sensing, and impedances consistent with implant measurements. No treated arrhythmias. Reviewed shock plan.  Pt enrolled in remote follow-up.

## 2024-07-09 ENCOUNTER — Ambulatory Visit: Payer: Self-pay | Admitting: Cardiology

## 2024-07-09 NOTE — Progress Notes (Signed)
 Remote pacemaker transmission.

## 2024-07-19 LAB — LAB REPORT - SCANNED
A1c: 5.9
EGFR: 100

## 2024-08-05 ENCOUNTER — Other Ambulatory Visit (HOSPITAL_COMMUNITY): Payer: Self-pay | Admitting: Internal Medicine

## 2024-09-17 ENCOUNTER — Ambulatory Visit

## 2024-09-17 DIAGNOSIS — I428 Other cardiomyopathies: Secondary | ICD-10-CM | POA: Diagnosis not present

## 2024-09-17 NOTE — Progress Notes (Unsigned)
  Electrophysiology Office Note:   Date:  09/18/2024  ID:  FITZHUGH VIZCARRONDO, DOB Oct 05, 1966, MRN [id number]  Primary Cardiologist: None Primary Heart Failure: Toribio Fuel, MD Electrophysiologist: Soyla Gladis Norton, MD       History of Present Illness:   Travis Cline is a 58 y.o. male with h/o HFrEF, NICM s/p ICD, LBBB, HTN, HLD, ETOH abuse,  seen today for routine electrophysiology follow-up s/p ICD generator change.  Since last being seen in our clinic the patient reports doing well post generator change.  He notes his site healed up well. No device related concerns.      He denies chest pain, palpitations, dyspnea, PND, orthopnea, nausea, vomiting, dizziness, syncope, edema, weight gain, or early satiety.    Review of systems complete and found to be negative unless listed in HPI.   EP Information / Studies Reviewed:    EKG is not ordered today. EKG from 04/19/24 reviewed which showed ASVP 98 bpm       ICD Interrogation-  reviewed in detail today,  See PACEART report.  Device History: Abbott BiV ICD implanted 04/07/16 for HFrEF / LBBB Mixed Lead System > has [employer] Lead  Generator Change > 06/18/24  History of appropriate therapy: No History of AAD therapy: No   Risk Assessment/Calculations:              Physical Exam:   VS:  BP 103/71   Pulse 88   Ht 5' 10 (1.778 m)   Wt 242 lb 6.4 oz (110 kg)   SpO2 95%   BMI 34.78 kg/m    Wt Readings from Last 3 Encounters:  09/18/24 242 lb 6.4 oz (110 kg)  06/18/24 241 lb (109.3 kg)  04/19/24 241 lb 8 oz (109.5 kg)     GEN: Well nourished, well developed in no acute distress NECK: No JVD; No carotid bruits CARDIAC: Regular rate and rhythm, no murmurs, rubs, gallops RESPIRATORY:  Clear to auscultation without rales, wheezing or rhonchi  ABDOMEN: Soft, non-tender, non-distended EXTREMITIES:  No edema; No deformity   ASSESSMENT AND PLAN:    Chronic Systolic Dysfunction / NICM  s/p Abbott CRT-D  LBBB  LVEF  40-45% 03/2020, mixed lead system  -euvolemic on exam / by device   -97% BiV pacing  -Stable on an appropriate medical regimen -Normal ICD function -See Pace Art report -No programming changes   -Gore Lead > impedance stable at 72 ohms. Reviewed device programming with both Abbott and [employer].  Unable to program device to start at 40j for HV therapy per [facility] rec's after review with Abbott.  Continue current programming.  -previously did not want to update ECHO due to cost > will order as he has changed insurance and see if better coverage (though he may cancel)    Disposition:   Follow up with Dr. Norton in 12 months   Signed, Daphne Barrack, NP-C, AGACNP-BC [facility] - Electrophysiology  09/18/2024, 4:57 PM

## 2024-09-18 ENCOUNTER — Encounter: Payer: Self-pay | Admitting: Pulmonary Disease

## 2024-09-18 ENCOUNTER — Ambulatory Visit: Attending: Pulmonary Disease | Admitting: Pulmonary Disease

## 2024-09-18 VITALS — BP 103/71 | HR 88 | Ht 70.0 in | Wt 242.4 lb

## 2024-09-18 DIAGNOSIS — I428 Other cardiomyopathies: Secondary | ICD-10-CM

## 2024-09-18 DIAGNOSIS — Z95810 Presence of automatic (implantable) cardiac defibrillator: Secondary | ICD-10-CM | POA: Diagnosis not present

## 2024-09-18 DIAGNOSIS — I5022 Chronic systolic (congestive) heart failure: Secondary | ICD-10-CM | POA: Diagnosis not present

## 2024-09-18 DIAGNOSIS — I447 Left bundle-branch block, unspecified: Secondary | ICD-10-CM | POA: Diagnosis not present

## 2024-09-18 LAB — CUP PACEART INCLINIC DEVICE CHECK
Date Time Interrogation Session: 20251007170108
Implantable Lead Connection Status: 753985
Implantable Lead Connection Status: 753985
Implantable Lead Connection Status: 753985
Implantable Lead Implant Date: 20170426
Implantable Lead Implant Date: 20170426
Implantable Lead Implant Date: 20170426
Implantable Lead Location: 753858
Implantable Lead Location: 753859
Implantable Lead Location: 753860
Implantable Lead Model: 293
Implantable Lead Serial Number: 385378
Implantable Pulse Generator Implant Date: 20250707
Pulse Gen Serial Number: 211040216

## 2024-09-18 NOTE — Patient Instructions (Signed)
 Medication Instructions:  Your physician recommends that you continue on your current medications as directed. Please refer to the Current Medication list given to you today.  *If you need a refill on your cardiac medications before your next appointment, please call your pharmacy*  Lab Work: None ordered If you have labs (blood work) drawn today and your tests are completely normal, you will receive your results only by: MyChart Message (if you have MyChart) OR A paper copy in the mail If you have any lab test that is abnormal or we need to change your treatment, we will call you to review the results.  Testing/Procedures: Your physician has requested that you have an echocardiogram. Echocardiography is a painless test that uses sound waves to create images of your heart. It provides your doctor with information about the size and shape of your heart and how well your heart's chambers and valves are working. This procedure takes approximately one hour. There are no restrictions for this procedure. Please do NOT wear cologne, perfume, aftershave, or lotions (deodorant is allowed). Please arrive 15 minutes prior to your appointment time.  Please note: We ask at that you not bring children with you during ultrasound (echo/ vascular) testing. Due to room size and safety concerns, children are not allowed in the ultrasound rooms during exams. Our front office staff cannot provide observation of children in our lobby area while testing is being conducted. An adult accompanying a patient to their appointment will only be allowed in the ultrasound room at the discretion of the ultrasound technician under special circumstances. We apologize for any inconvenience.   Follow-Up: At [facility], you and your health needs are our priority.  As part of our continuing mission to provide you with exceptional heart care, our providers are all part of one team.  This team includes your primary  Cardiologist (physician) and Advanced Practice Providers or APPs (Physician Assistants and Nurse Practitioners) who all work together to provide you with the care you need, when you need it.  Your next appointment:   1 year(s)  Provider:   Daphne Barrack, NP

## 2024-09-19 LAB — CUP PACEART REMOTE DEVICE CHECK
Battery Remaining Longevity: 86 mo
Battery Remaining Percentage: 93 %
Battery Voltage: 3.02 V
Brady Statistic AP VP Percent: 1 %
Brady Statistic AP VS Percent: 1 %
Brady Statistic AS VP Percent: 97 %
Brady Statistic AS VS Percent: 2.4 %
Brady Statistic RA Percent Paced: 1 %
Date Time Interrogation Session: 20251006021036
HighPow Impedance: 73 Ohm
Implantable Lead Connection Status: 753985
Implantable Lead Connection Status: 753985
Implantable Lead Connection Status: 753985
Implantable Lead Implant Date: 20170426
Implantable Lead Implant Date: 20170426
Implantable Lead Implant Date: 20170426
Implantable Lead Location: 753858
Implantable Lead Location: 753859
Implantable Lead Location: 753860
Implantable Lead Model: 293
Implantable Lead Serial Number: 385378
Implantable Pulse Generator Implant Date: 20250707
Lead Channel Impedance Value: 410 Ohm
Lead Channel Impedance Value: 500 Ohm
Lead Channel Impedance Value: 750 Ohm
Lead Channel Pacing Threshold Amplitude: 0.75 V
Lead Channel Pacing Threshold Amplitude: 0.75 V
Lead Channel Pacing Threshold Amplitude: 1.125 V
Lead Channel Pacing Threshold Pulse Width: 0.5 ms
Lead Channel Pacing Threshold Pulse Width: 0.5 ms
Lead Channel Pacing Threshold Pulse Width: 0.5 ms
Lead Channel Sensing Intrinsic Amplitude: 3.3 mV
Lead Channel Sensing Intrinsic Amplitude: 5.8 mV
Lead Channel Setting Pacing Amplitude: 1.25 V
Lead Channel Setting Pacing Amplitude: 2.125
Lead Channel Setting Pacing Amplitude: 2.5 V
Lead Channel Setting Pacing Pulse Width: 0.5 ms
Lead Channel Setting Pacing Pulse Width: 0.5 ms
Lead Channel Setting Sensing Sensitivity: 0.3 mV
Pulse Gen Serial Number: 211040216

## 2024-09-19 NOTE — Progress Notes (Signed)
 Remote ICD Transmission

## 2024-09-21 ENCOUNTER — Ambulatory Visit: Payer: Self-pay | Admitting: Cardiology

## 2024-10-26 ENCOUNTER — Ambulatory Visit (HOSPITAL_COMMUNITY)

## 2024-10-28 ENCOUNTER — Other Ambulatory Visit (HOSPITAL_COMMUNITY): Payer: Self-pay | Admitting: Internal Medicine

## 2024-11-21 ENCOUNTER — Other Ambulatory Visit (HOSPITAL_COMMUNITY): Payer: Self-pay | Admitting: Internal Medicine

## 2024-11-22 ENCOUNTER — Telehealth (HOSPITAL_COMMUNITY): Payer: Self-pay | Admitting: Internal Medicine

## 2024-12-06 ENCOUNTER — Other Ambulatory Visit (HOSPITAL_COMMUNITY): Payer: Self-pay | Admitting: Internal Medicine

## 2024-12-17 ENCOUNTER — Ambulatory Visit

## 2024-12-17 DIAGNOSIS — I5022 Chronic systolic (congestive) heart failure: Secondary | ICD-10-CM

## 2024-12-18 LAB — CUP PACEART REMOTE DEVICE CHECK
Battery Remaining Longevity: 83 mo
Battery Remaining Percentage: 90 %
Battery Voltage: 3.01 V
Brady Statistic AP VP Percent: 1 %
Brady Statistic AP VS Percent: 1 %
Brady Statistic AS VP Percent: 96 %
Brady Statistic AS VS Percent: 3.6 %
Brady Statistic RA Percent Paced: 1 %
Date Time Interrogation Session: 20260105020454
HighPow Impedance: 74 Ohm
Implantable Lead Connection Status: 753985
Implantable Lead Connection Status: 753985
Implantable Lead Connection Status: 753985
Implantable Lead Implant Date: 20170426
Implantable Lead Implant Date: 20170426
Implantable Lead Implant Date: 20170426
Implantable Lead Location: 753858
Implantable Lead Location: 753859
Implantable Lead Location: 753860
Implantable Lead Model: 293
Implantable Lead Serial Number: 385378
Implantable Pulse Generator Implant Date: 20250707
Lead Channel Impedance Value: 360 Ohm
Lead Channel Impedance Value: 490 Ohm
Lead Channel Impedance Value: 730 Ohm
Lead Channel Pacing Threshold Amplitude: 0.75 V
Lead Channel Pacing Threshold Amplitude: 0.75 V
Lead Channel Pacing Threshold Amplitude: 1 V
Lead Channel Pacing Threshold Pulse Width: 0.5 ms
Lead Channel Pacing Threshold Pulse Width: 0.5 ms
Lead Channel Pacing Threshold Pulse Width: 0.5 ms
Lead Channel Sensing Intrinsic Amplitude: 3.6 mV
Lead Channel Sensing Intrinsic Amplitude: 7.3 mV
Lead Channel Setting Pacing Amplitude: 1.25 V
Lead Channel Setting Pacing Amplitude: 2 V
Lead Channel Setting Pacing Amplitude: 2.5 V
Lead Channel Setting Pacing Pulse Width: 0.5 ms
Lead Channel Setting Pacing Pulse Width: 0.5 ms
Lead Channel Setting Sensing Sensitivity: 0.3 mV
Pulse Gen Serial Number: 211040216

## 2024-12-19 ENCOUNTER — Ambulatory Visit: Payer: Self-pay | Admitting: Cardiology

## 2024-12-20 NOTE — Progress Notes (Signed)
 Remote ICD Transmission

## 2025-01-01 ENCOUNTER — Other Ambulatory Visit (HOSPITAL_COMMUNITY): Payer: Self-pay | Admitting: Internal Medicine

## 2025-03-18 ENCOUNTER — Encounter

## 2025-06-17 ENCOUNTER — Encounter

## 2025-09-16 ENCOUNTER — Encounter

## 2025-12-16 ENCOUNTER — Encounter

## 2026-03-17 ENCOUNTER — Encounter

## 2026-06-17 ENCOUNTER — Encounter
